# Patient Record
Sex: Male | Born: 1965
Health system: Southern US, Community
[De-identification: ages and names within clinical notes are randomized; demographics above are authoritative.]

## PROBLEM LIST (undated history)

## (undated) DIAGNOSIS — N529 Male erectile dysfunction, unspecified: Secondary | ICD-10-CM

## (undated) DIAGNOSIS — Z8719 Personal history of other diseases of the digestive system: Secondary | ICD-10-CM

## (undated) DIAGNOSIS — D649 Anemia, unspecified: Secondary | ICD-10-CM

## (undated) DIAGNOSIS — F329 Major depressive disorder, single episode, unspecified: Secondary | ICD-10-CM

## (undated) DIAGNOSIS — G4730 Sleep apnea, unspecified: Secondary | ICD-10-CM

## (undated) DIAGNOSIS — I25810 Atherosclerosis of coronary artery bypass graft(s) without angina pectoris: Secondary | ICD-10-CM

## (undated) DIAGNOSIS — R200 Anesthesia of skin: Secondary | ICD-10-CM

## (undated) DIAGNOSIS — K5790 Diverticulosis of intestine, part unspecified, without perforation or abscess without bleeding: Secondary | ICD-10-CM

## (undated) DIAGNOSIS — R202 Paresthesia of skin: Secondary | ICD-10-CM

## (undated) DIAGNOSIS — K5792 Diverticulitis of intestine, part unspecified, without perforation or abscess without bleeding: Secondary | ICD-10-CM

## (undated) DIAGNOSIS — T7840XA Allergy, unspecified, initial encounter: Secondary | ICD-10-CM

## (undated) DIAGNOSIS — I219 Acute myocardial infarction, unspecified: Secondary | ICD-10-CM

## (undated) DIAGNOSIS — Z87440 Personal history of urinary (tract) infections: Secondary | ICD-10-CM

## (undated) DIAGNOSIS — I959 Hypotension, unspecified: Secondary | ICD-10-CM

## (undated) DIAGNOSIS — L309 Dermatitis, unspecified: Secondary | ICD-10-CM

## (undated) DIAGNOSIS — E782 Mixed hyperlipidemia: Secondary | ICD-10-CM

## (undated) DIAGNOSIS — Z86010 Personal history of colonic polyps: Secondary | ICD-10-CM

## (undated) DIAGNOSIS — F32A Depression, unspecified: Secondary | ICD-10-CM

## (undated) HISTORY — DX: Allergy, unspecified, initial encounter: T78.40XA

## (undated) HISTORY — DX: Major depressive disorder, single episode, unspecified: F32.9

## (undated) HISTORY — DX: Personal history of colonic polyps: Z86.010

## (undated) HISTORY — DX: Sleep apnea, unspecified: G47.30

## (undated) HISTORY — DX: Hypotension, unspecified: I95.9

## (undated) HISTORY — DX: Acute myocardial infarction, unspecified: I21.9

## (undated) HISTORY — DX: Atherosclerosis of coronary artery bypass graft(s) without angina pectoris: I25.810

## (undated) HISTORY — DX: Mixed hyperlipidemia: E78.2

## (undated) HISTORY — DX: Depression, unspecified: F32.A

## (undated) HISTORY — DX: Dermatitis, unspecified: L30.9

## (undated) HISTORY — DX: Male erectile dysfunction, unspecified: N52.9

---

## 1988-05-07 DIAGNOSIS — Z87440 Personal history of urinary (tract) infections: Secondary | ICD-10-CM

## 1988-05-07 HISTORY — DX: Personal history of urinary (tract) infections: Z87.440

## 2005-05-07 HISTORY — PX: COLONOSCOPY: SHX174

## 2007-05-08 DIAGNOSIS — I25810 Atherosclerosis of coronary artery bypass graft(s) without angina pectoris: Secondary | ICD-10-CM

## 2007-05-08 HISTORY — PX: CORONARY ARTERY BYPASS GRAFT: SHX141

## 2007-05-08 HISTORY — DX: Atherosclerosis of coronary artery bypass graft(s) without angina pectoris: I25.810

## 2008-01-01 ENCOUNTER — Inpatient Hospital Stay (HOSPITAL_COMMUNITY): Admission: AD | Admit: 2008-01-01 | Discharge: 2008-01-09 | Payer: Self-pay | Admitting: Cardiology

## 2008-01-01 ENCOUNTER — Ambulatory Visit: Payer: Self-pay | Admitting: Cardiology

## 2008-01-01 ENCOUNTER — Ambulatory Visit: Payer: Self-pay | Admitting: Surgery

## 2008-01-02 ENCOUNTER — Encounter: Payer: Self-pay | Admitting: Surgery

## 2008-02-02 ENCOUNTER — Encounter: Payer: Self-pay | Admitting: Physician Assistant

## 2008-02-02 ENCOUNTER — Ambulatory Visit: Payer: Self-pay | Admitting: Cardiology

## 2008-02-11 ENCOUNTER — Encounter: Payer: Self-pay | Admitting: Cardiology

## 2008-02-17 ENCOUNTER — Ambulatory Visit: Payer: Self-pay | Admitting: Surgery

## 2008-02-17 ENCOUNTER — Encounter: Admission: RE | Admit: 2008-02-17 | Discharge: 2008-02-17 | Payer: Self-pay | Admitting: Surgery

## 2008-07-08 ENCOUNTER — Ambulatory Visit: Payer: Self-pay | Admitting: Cardiology

## 2008-07-14 ENCOUNTER — Encounter: Payer: Self-pay | Admitting: Cardiology

## 2008-08-30 ENCOUNTER — Encounter: Payer: Self-pay | Admitting: Cardiology

## 2009-01-18 DIAGNOSIS — I25810 Atherosclerosis of coronary artery bypass graft(s) without angina pectoris: Secondary | ICD-10-CM | POA: Insufficient documentation

## 2009-01-18 DIAGNOSIS — E785 Hyperlipidemia, unspecified: Secondary | ICD-10-CM | POA: Insufficient documentation

## 2009-01-18 DIAGNOSIS — I959 Hypotension, unspecified: Secondary | ICD-10-CM

## 2009-07-05 ENCOUNTER — Ambulatory Visit: Payer: Self-pay | Admitting: Cardiology

## 2009-07-05 DIAGNOSIS — I10 Essential (primary) hypertension: Secondary | ICD-10-CM | POA: Insufficient documentation

## 2009-07-06 ENCOUNTER — Encounter: Payer: Self-pay | Admitting: Cardiology

## 2010-05-28 ENCOUNTER — Encounter: Payer: Self-pay | Admitting: Surgery

## 2010-06-06 NOTE — Assessment & Plan Note (Signed)
Summary: 1 YR FU PER MARCH REMINDER-SRS   Visit Type:  Follow-up Primary Provider:  Hasanaj  CC:  follow-up visit.  History of Present Illness: the patient is a 45 year old male with a history coronary artery disease status post 4-vessel coronary bypass grafting in August of 2009.  The patient has a free radial graft also.  The patient has mild LV dysfunction with an ejection fraction of 45 to 50%.  The patient presents for follow-up.  The patient denies any chest pain shortness of breath palpitations or syncope.  He does report some back pain when bending forward.  From a cardiovascular standpoint he appears to be stable.  He denies any palpitations, syncope orthopnea PND.  Clinical Review Panels:  CXR CXR results  Right internal jugular sheath has been with drawn without   pneumothorax.  Lungs are currently clear.  Heart size is normal.   Post CABG change noted with no other new significant abnormalities   seen.    IMPRESSION:   1.  Post CABG.   2.  No active disease. (02/17/2008)  Cardiac Imaging Cardiac Cath Findings  CONCLUSIONS:   1. Total occlusion of the circumflex and right coronary arteries.   2. Minimum 8 successive lesions in the left anterior descending       artery, and including high-grade disease of the large second       diagonal branch as noted above.   3. Progressive symptoms compatible with angina pectoris.      RECOMMENDATIONS:  The LAD is not ideal for grafting.  Nonetheless, the   apical vessel possibly could be grafted as well as the large diagonal.   In addition, the marginal and the RCA also appear to be optimal for   grafting.  Surgical consultation will be obtained.      Arturo Morton. Riley Kill, MD, [facility] (01/01/2008)    Preventive Screening-Counseling & Management  Alcohol-Tobacco     Smoking Status: never  Current Medications (verified): 1)  Metoprolol Tartrate 25 Mg Tabs (Metoprolol Tartrate) .... Take One Tablet By Mouth Twice A Day 2)   Crestor 40 Mg Tabs (Rosuvastatin Calcium) .... Take One Tablet By Mouth Daily. 3)  Multivitamins  Tabs (Multiple Vitamin) .... Take 1 Tablet By Mouth Once A Day 4)  Budeprion Xl 150 Mg Xr24h-Tab (Bupropion Hcl) .... Take 1 Tablet By Mouth Daily 5)  Aspir-Low 81 Mg Tbec (Aspirin) .... Take 1 Tablet By Mouth Once A Day  Allergies (verified): 1)  ! Pcn 2)  ! Sulfa  Comments:  Nurse/Medical Assistant: The patient's medications and allergies were reviewed with the patient and were updated in the Medication and Allergy Lists. Verbally gave names.  Past History:  Past Medical History: Last updated: 01/18/2009 HYPOTENSION, UNSPECIFIED (ICD-458.9) CAD, ARTERY BYPASS GRAFT (ICD-414.04) HYPERLIPIDEMIA-MIXED (ICD-272.4) Left upper extremity dermatiti  Past Surgical History: Last updated: 01/18/2009 CABG  Family History: Last updated: 01/18/2009 Family History of Coronary Artery Disease:  Family History of Renal Disease:  CHF valvular disease  Social History: Last updated: 01/18/2009 Full Time Married  Tobacco Use - No.  Alcohol Use - yes  Risk Factors: Smoking Status: never (07/05/2009)  Vital Signs:  Patient profile:   45 year old male Height:      74 inches Weight:      208 pounds BMI:     26.80 Pulse rate:   61 / minute BP sitting:   109 / 72  (right arm) Cuff size:   large  Vitals Entered By: Carlye Grippe (July 05, 2009 9:50 AM)  Nutrition Counseling: Patient's BMI is greater than 25 and therefore counseled on weight management options. CC: follow-up visit   Physical Exam  Additional Exam:  General: Well-developed, well-nourished in no distress head: Normocephalic and atraumatic eyes PERRLA/EOMI intact, conjunctiva and lids normal nose: No deformity or lesions mouth normal dentition, normal posterior pharynx neck: Supple, no JVD.  No masses, thyromegaly or abnormal cervical nodes lungs: Normal breath sounds bilaterally without wheezing.  Normal  percussion heart: regular rate and rhythm with normal S1 and S2, no S3 or S4.  PMI is normal.  No pathological murmurs abdomen: Normal bowel sounds, abdomen is soft and nontender without masses, organomegaly or hernias noted.  No hepatosplenomegaly musculoskeletal: Back normal, normal gait muscle strength and tone normal pulsus: Pulse is normal in all 4 extremities Extremities: No peripheral pitting edema neurologic: Alert and oriented x 3 skin: Intact without lesions or rashes cervical nodes: No significant adenopathy psychologic: Normal affect    EKG  Procedure date:  07/05/2009  Findings:      sinus bradycardia.  Otherwise normal EKG.  Heart rate 55 beats/min.  Impression & Recommendations:  Problem # 1:  CAD, ARTERY BYPASS GRAFT (ICD-414.04) the patient is status post coronary bypass grafting.  He denies any substernal chest pain.  He does need a lipid panel checked.  He has no heart failure symptoms. His updated medication list for this problem includes:    Metoprolol Tartrate 25 Mg Tabs (Metoprolol tartrate) .[name]... Take one tablet by mouth twice a day    Aspir-low 81 Mg Tbec (Aspirin) .[name]... Take 1 tablet by mouth once a day  Orders: EKG w/ Interpretation (93000) T-Comprehensive Metabolic Panel (11914-78295) T-CBC No Diff (62130-86578) T-Lipid Profile (46962-95284) T-TSH (13244-01027)  Problem # 2:  HYPERLIPIDEMIA-MIXED (ICD-272.4) Assessment: Comment Only  His updated medication list for this problem includes:    Crestor 40 Mg Tabs (Rosuvastatin calcium) .[name]... Take one tablet by mouth daily.  Orders: T-Comprehensive Metabolic Panel [phone]) T-CBC No Diff (74259-56387) T-Lipid Profile (56433-29518) T-TSH (84166-06301)  Problem # 3:  ESSENTIAL HYPERTENSION, BENIGN (ICD-401.1) blood pressure is well-controlled.  Make no further changes in patient's medications. His updated medication list for this problem includes:    Metoprolol Tartrate 25 Mg Tabs (Metoprolol  tartrate) .[name]... Take one tablet by mouth twice a day    Aspir-low 81 Mg Tbec (Aspirin) .[name]... Take 1 tablet by mouth once a day  Orders: T-Comprehensive Metabolic Panel (60109-32355) T-CBC No Diff (73220-25427) T-Lipid Profile (06237-62831) T-TSH (51761-60737)  Patient Instructions: 1)  Labs  2)  Follow up in  1 year.

## 2010-07-11 ENCOUNTER — Ambulatory Visit (INDEPENDENT_AMBULATORY_CARE_PROVIDER_SITE_OTHER): Payer: PRIVATE HEALTH INSURANCE | Admitting: Cardiology

## 2010-07-11 ENCOUNTER — Encounter: Payer: Self-pay | Admitting: Cardiology

## 2010-07-11 DIAGNOSIS — I2510 Atherosclerotic heart disease of native coronary artery without angina pectoris: Secondary | ICD-10-CM

## 2010-07-11 DIAGNOSIS — G4700 Insomnia, unspecified: Secondary | ICD-10-CM

## 2010-07-11 DIAGNOSIS — Z951 Presence of aortocoronary bypass graft: Secondary | ICD-10-CM

## 2010-07-11 DIAGNOSIS — F411 Generalized anxiety disorder: Secondary | ICD-10-CM | POA: Insufficient documentation

## 2010-07-25 NOTE — Assessment & Plan Note (Signed)
Summary: 1 yr fu recv reminder vs   Visit Type:  Follow-up Primary Provider:  Hasanaj   History of Present Illness: The patient is a 45 year old male with a history of coronary artery disease status post 4-vessel coronary bypass grafting in August of 2009.  The patient had a free radial graft placed.  He has mild LV dysfunction with an ejection fraction of 45 to 50%. Laboratory work was performed approximately year ago.  Renal function was within normal limits.  Liver function tests were within normal limits.  Triglycerides were 197 cholesterol was 137 HDL 28 and LDL 70.  CBC was within normal limits.  The patient had blood work done more recently which we do not available.  He apparently was told that his triglycerides were high and has been started on FishOil The patient has been doing well after coronary bypass grafting.  He reports no recurrent chest pain or shortness of breath orthopnea PND.  He has no palpitations or syncope. The patient's wife is concerned about the use of Wellbutrin because of the fact that the patient has difficulty with sleeping and has significant anxiety.  She requested change to an SSRI but sedated properties.  I told her that did not disagree with her and sertraline may be a good option. 12-lead electrocardiogram was reviewed.  This was within normal limits.  No acute ischemic changes.  Heart rate 79 beats/min.   Preventive Screening-Counseling & Management  Alcohol-Tobacco     Smoking Status: quit     Year Quit: 2005  Current Medications (verified): 1)  Crestor 40 Mg Tabs (Rosuvastatin Calcium) .... Take One Tablet By Mouth Daily. 2)  Multivitamins  Tabs (Multiple Vitamin) .... Take 1 Tablet By Mouth Once A Day 3)  Budeprion Xl 150 Mg Xr24h-Tab (Bupropion Hcl) .... Take 1 Tablet By Mouth Daily 4)  Aspir-Low 81 Mg Tbec (Aspirin) .... Take 1 Tablet By Mouth Once A Day 5)  Bystolic 5 Mg Tabs (Nebivolol Hcl) .... Take 1 Tablet By Mouth Once A Day 6)  Fish Oil   Oil (Fish Oil) .... 360mg  Take 1 Tablet By Mouth Once A Day  Allergies (verified): 1)  ! Pcn 2)  ! Sulfa  Comments:  Nurse/Medical Assistant: The patient's medication bottle and allergies were verbally reviewed with the patient and were updated in the Medication and Allergy Lists.  Past History:  Past Medical History: Last updated: 01/18/2009 HYPOTENSION, UNSPECIFIED (ICD-458.9) CAD, ARTERY BYPASS GRAFT (ICD-414.04) HYPERLIPIDEMIA-MIXED (ICD-272.4) Left upper extremity dermatiti  Past Surgical History: Last updated: 01/18/2009 CABG  Family History: Last updated: 01/18/2009 Family History of Coronary Artery Disease:  Family History of Renal Disease:  CHF valvular disease  Social History: Last updated: 01/18/2009 Full Time Married  Tobacco Use - No.  Alcohol Use - yes  Risk Factors: Smoking Status: quit (07/11/2010)  Social History: Smoking Status:  quit  Review of Systems  The patient denies fatigue, malaise, fever, weight gain/loss, vision loss, decreased hearing, hoarseness, chest pain, palpitations, shortness of breath, prolonged cough, wheezing, sleep apnea, coughing up blood, abdominal pain, blood in stool, nausea, vomiting, diarrhea, heartburn, incontinence, blood in urine, muscle weakness, joint pain, leg swelling, rash, skin lesions, headache, fainting, dizziness, depression, anxiety, enlarged lymph nodes, easy bruising or bleeding, and environmental allergies.    Vital Signs:  Patient profile:   45 year old male Height:      74 inches Weight:      215 pounds BMI:     27.70 Pulse rate:   82 /  minute BP sitting:   109 / 73  (left arm) Cuff size:   large  Vitals Entered By: Carlye Grippe (July 11, 2010 9:54 AM)  Nutrition Counseling: Patient's BMI is greater than 25 and therefore counseled on weight management options.  Physical Exam  Additional Exam:  General: Well-developed, well-nourished in no distress head: Normocephalic and  atraumatic eyes PERRLA/EOMI intact, conjunctiva and lids normal nose: No deformity or lesions mouth normal dentition, normal posterior pharynx neck: Supple, no JVD.  No masses, thyromegaly or abnormal cervical nodes lungs: Normal breath sounds bilaterally without wheezing.  Normal percussion heart: regular rate and rhythm with normal S1 and S2, no S3 or S4.  PMI is normal.  No pathological murmurs abdomen: Normal bowel sounds, abdomen is soft and nontender without masses, organomegaly or hernias noted.  No hepatosplenomegaly musculoskeletal: Back normal, normal gait muscle strength and tone normal pulsus: Pulse is normal in all 4 extremities Extremities: No peripheral pitting edema neurologic: Alert and oriented x 3 skin: Intact without lesions or rashes cervical nodes: No significant adenopathy psychologic: Normal affect  Normal physical examination with well-healed sternotomy scar and a long scar in the left forearm from the free radial graft    Impression & Recommendations:  Problem # 1:  CAD, ARTERY BYPASS GRAFT (ICD-414.04) coronary artery disease: Status post coronary bypass grafting with a free radial graft.  Mild LV dysfunction ejection fraction 45 to 50%.  A portable echocardiogram was performed in the office.  Ejection fraction is approximately 50%.  There is some posterior wall hypokinesis.  I recommended a stress test in one year. The following medications were removed from the medication list:    Metoprolol Tartrate 25 Mg Tabs (Metoprolol tartrate) .[name]... Take one tablet by mouth twice a day His updated medication list for this problem includes:    Aspir-low 81 Mg Tbec (Aspirin) .[name]... Take 1 tablet by mouth once a day    Bystolic 5 Mg Tabs (Nebivolol hcl) .[name]... Take 1 tablet by mouth once a day  Orders: EKG w/ Interpretation (93000)  Problem # 2:  HYPERLIPIDEMIA-MIXED (ICD-272.4) dyslipidemia: Recent lipid profile was done.  I do not have results available but apparently  the patient's primary care physician recommended the addition of fish oil likely for hypertriglyceridemia. His updated medication list for this problem includes:    Crestor 40 Mg Tabs (Rosuvastatin calcium) .[name]... Take one tablet by mouth daily.  Problem # 3:  ANXIETY STATE, UNSPECIFIED (ICD-300.00) Anxiety and insomnia.  Wellbutrin is a poor choice given its arousal properties.  I changed the patient to Zoloft 50 mg p.o. nightly.  I also gave him  a prescription of clonazepam .5 mg p.o. b.i.d. p.r.n.  Patient Instructions: 1)  Your physician has recommended you make the following change in your medication: STOP BUDEPRION AND START ZOLOFT(SERTRALINE) DAILY AT BEDTIME. START CLONAZEPAM AS NEEDED.  2)  Your physician wants you to follow-up in: 6 months.  You will receive a reminder letter in the mail about two months in advance. If you don't receive a letter, please call our office to schedule the follow-up appointment. Prescriptions: CLONAZEPAM 0.5 MG TABS (CLONAZEPAM) Take 1 tablet by mouth two times a day as needed anxiety,insomnia  #60 x 1   Entered by:   Carlye Grippe   Authorized by:   Lewayne Bunting, MD, [facility]   Signed by:   Carlye Grippe on 07/11/2010   Method used:   Print then Give to Patient   RxID:   [id number] ZOLOFT  50 MG TABS (SERTRALINE HCL) Take 1 tablet by mouth once a day at bedtime  #30 x 6   Entered by:   Carlye Grippe   Authorized by:   Lewayne Bunting, MD, [facility]   Signed by:   Carlye Grippe on 07/11/2010   Method used:   Print then Give to Patient   RxID:   [id number]

## 2010-09-19 NOTE — Letter (Signed)
January 09, 2008    Dr. Annette Stable Hasanaj  [address]  [place], [place] [address]   RE:  [name], [name]  MRN:  [id number]  /  DOB:  07-25-1965   Dear Dr. Olena Leatherwood:   Your nice patient was transferred as you know after exercise testing,  urgently to [facility].  He was seen by Dr. Andee Lineman.  He was  transferred promptly to [facility] where he underwent  catheterization which demonstrated total occlusion of the circumflex,  total occlusion of the right coronary artery with multiple severe  lesions of the LAD.  His ejection fraction was 45-50% with inferior  hypokinesis.  He was seen urgently in consultation by Dr. Evelene Croon.  The patient subsequently underwent coronary artery bypass graft surgery  with median sternotomy with a left internal mammary to the LAD, left  radial artery to the obtuse marginal, saphenous vein graft to the  diagonal and a saphenous vein graft to the right coronary artery.  In  general, he has had relatively underwent for hospital course.  He has  slowly and gradually improved.  The surgeons have deemed him ready for  discharge today, January 09, 2008, and he will be dismissed.  He will  be an excellent candidate for rehab in [place], and we will  make arrangements for his followup in the [facility] and  then subsequently with a rehab program.  I have encouraged him to follow  up with you.   Thanks for referring him.    Sincerely,      Arturo Morton. Riley Kill, MD, [facility]  Electronically Signed    TDS/MedQ  DD: 01/09/2008  DT: 01/09/2008  Job #: [id number]   CC:    Gene Serpe, PA-C

## 2010-09-19 NOTE — Consult Note (Signed)
[name], [name]:  [id number]   MEDICAL RECORD NO.:  [id number]          PATIENT TYPE:  INP   LOCATION:  2901                         FACILITY:  [facility]   PHYSICIAN:  Evelene Croon, M.D.     DATE OF BIRTH:  Aug 03, 1965   DATE OF CONSULTATION:  DATE OF DISCHARGE:                                 CONSULTATION   REFERRING PHYSICIAN:  Arturo Morton. Riley Kill, MD, [facility]   REASON FOR CONSULTATION:  Severe three-vessel coronary disease with mild  left ventricular dysfunction.   CLINICAL HISTORY:  I was asked by Dr. Riley Kill to evaluate Mr. Kotch  for consideration of coronary artery bypass graft surgery.  He is a 45-  year-old gentleman with a strong family history of heart disease who  presented with a 2-week history of left shoulder pain while walking as  well as indigestion and substernal chest discomfort after eating.  He  developed similar symptoms this morning and drank a Coke and belched and  his pain went away.  His wife insisted that he be checked and he had a  stress test performed this morning associated with chest pain and  nausea.  He had positive EKG changes.  He is transferred to [facility]  for cardiac catheterization and that showed severe three-vessel coronary  disease.  The LAD was a large vessel that wrapped around the apex.  This  had severe diffuse disease with multiple 70-90% stenoses throughout the  vessel.  This vessel was severely diseased throughout the mid and distal  portion.  There was a moderate to large diagonal branch had 80%  sequential proximal stenosis.  The left circumflex was occluded  proximally with filling of the large marginal branch by collaterals.  The right coronary was also occluded proximally with filling the distal  vessel by collaterals.  Ejection fraction of 45-50% with inferior  hypokinesis.  The left internal mammary artery was patent.  There was no  gradient across the aortic valve and no mitral regurgitation.   PAST  MEDICAL HISTORY:  Significant only for an episode of diverticulitis  requiring hospitalization few years ago.  He denies hypertension,  hyperlipidemia, and diabetes.   REVIEW OF SYSTEMS:  GENERAL:  He denies any fever or chills.  He had no  recent weight changes.  He has had some fatigue.  EYES:  Negative.  ENT:  Negative.  ENDOCRINE:  He denies diabetes and hypothyroidism.  CARDIOVASCULAR:  As above.  He denies PND or orthopnea.  Denies  peripheral edema.  Had no palpitations.  RESPIRATORY:  Denies cough and  sputum production.  GI:  Has had no nausea, vomiting.  Denies melena and  bright red blood per rectum.  GU:  Denies dysuria and hematuria.  MUSCULOSKELETAL:  Denies arthralgias and myalgias.  HEMATOLOGICAL:  He  denies any history of bleeding disorders or easy bleeding.  NEUROLOGIC:  Denies any focal weakness or numbness.  He denies dizziness  and syncope.  He has never had TIA or stroke.   ALLERGIES:  SULFA and PENICILLIN.   MEDICATIONS:  Prior to admission were:  1. Wellbutrin  150 mg daily.  2. Prevacid 30 mg daily.  3. He was given Plavix 300 mg at 10 o'clock this morning.   SOCIAL HISTORY:  He is married and lives with his wife.  He has 2  children.  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  He is a nonsmoker and  drinks occasional alcohol.   FAMILY HISTORY:  Strongly positive for coronary artery disease.  His  father died at age 37 of myocardial infarction.  I had operated on his  mother previously for coronary bypass surgery and she subsequently  expired of severe valvular disease and congestive heart failure with  acute renal failure.  She was 79.   PHYSICAL EXAMINATION:  VITAL SIGNS:  Blood pressure is 112/69.  His  pulse is 90 and regular.  Respiratory rate is 16 unlabored.  He is a  well-developed white male in no distress.  HEENT:  Normocephalic and atraumatic.  Pupils are equal, reactive to  light accommodation.  Extraocular muscles are intact.  Throat is clear.   NECK:  Normal carotid pulses bilaterally.  There are no bruits.  There  is no adenopathy or thyromegaly.  CARDIAC:  Regular rate and rhythm with normal S1 and S2.  There is no  murmur, rub, or gallop.  LUNGS:  Clear.  ABDOMEN: Active bowel sounds.  Abdomen is soft and nontender.  No  palpable masses or organomegaly.  EXTREMITY:  No peripheral edema.  Pedal pulses palpable bilaterally.  SKIN:  Warm and dry.  His radial pulses are strongly palpable  bilaterally.  He is left-handed.  NEUROLOGIC:  Alert and oriented x3.  Motor and sensory exams grossly  normal.   IMPRESSION:  Mr. Goates has severe three-vessel coronary disease with  markedly abnormal stress test and probable recent out-of-hospital  inferior myocardial infarction.  I agree that coronary bypass graft  surgery is the best treatment and further ischemia infarction improve  his quality of life.  We will obtain preoperative carotid and upper  extremity arterial Dopplers tomorrow.  I will tentatively plan to do  coronary bypass surgery on Monday, January 05, 2008.  I discussed the  operative procedure of coronary bypass surgery with him and his wife  including possible use of both internal mammary arteries and a radial  artery graft.  I discussed the use of some saphenous vein.  I discussed  the benefits and risks of surgery including but not limited to bleeding,  blood transfusion, infection, stroke, myocardial infarction, graft  failure, and death.  He understands and agrees to proceed.      Evelene Croon, M.D.  Electronically Signed     BB/MEDQ  D:  01/01/2008  T:  01/02/2008  Job:  [id number]   cc:   [facility]

## 2010-09-19 NOTE — Cardiovascular Report (Signed)
NAMEMCCARTNEY, CHUBA NO.:  [id number]   MEDICAL RECORD NO.:  [id number]          PATIENT TYPE:  INP   LOCATION:  2901                         FACILITY:  [facility]   PHYSICIAN:  Arturo Morton. Riley Kill, MD, FACCDATE OF BIRTH:  August 11, 1965   DATE OF PROCEDURE:  01/01/2008  DATE OF DISCHARGE:                            CARDIAC CATHETERIZATION   INDICATIONS:  Mr. Harker is a 45 year old who had another exercise  tolerance test today.  He developed electrocardiographic abnormalities.  He has had recent discomfort with activity.  He has a strong family  history of coronary artery disease.  This study was done to assess  coronary anatomy.   PROCEDURES:  1. Left heart catheterization.  2. Selective coronary angiography.  3. Selective left ventriculography.  4. Subclavian angiography.   DESCRIPTION OF PROCEDURE:  The patient was brought to the Cath Lab,  prepped and draped in the usual fashion.  Through an anterior puncture,  the femoral artery was easily entered and a 5-French sheath was placed.  Views of left and right coronaries were obtained in several angiographic  projections.  Subclavian angiography was performed.  Central aortic and  left ventricular pressures were measured with a pigtail.  Ventriculography was performed in the RAO projection.  Because of mild  hypotension, his intravenous nitroglycerin was discontinued and  intravenous fluids administered.  There were no complications.  He was  taken to the holding area in satisfactory clinical condition for sheath  removal.  I spoke with his family in detail.   HEMODYNAMIC DATA:  1. Central aortic pressure 184/64, mean 75.  2. Left ventricular pressure 85/5.  3. No gradient or pullback across the aortic valve.   ANGIOGRAPHIC DATA:  1. The left main is free of critical disease.  2. The LAD courses to the apex.  The LAD has multiple lesions      including a 70% proximal mid and 80% lesion at the takeoff of the    third diagonal.  There were then in the distal vessel multiple      lesions including a 90, 70, 50, 90, and 90 in sequence.  There is a      70% apical stenosis as well.  The first diagonal branch is small-to-      moderate in size with 90% ostial narrowing.  Second diagonal is      fairly large in size with 80% proximal and 80% mid narrowing.      Second diagonal does appear to be graftable.  The third diagonal      has 90% ostial narrowing.  3. The circumflex has a tiny second marginal with 90% narrowing that      was totally occluded and it started filling late by collaterals.  4. The right coronary artery is totally occluded proximally and fills      in by left-to-right collaterals.  5. Ventriculography in the RAO projection reveals estimated ejection      fraction of 45-50%.  There is inferior wall motion abnormality in      the mid inferior wall corresponding the circumflex territory and  anteroapical abnormality, that is mild.   CONCLUSIONS:  1. Total occlusion of the circumflex and right coronary arteries.  2. Minimum 8 successive lesions in the left anterior descending      artery, and including high-grade disease of the large second      diagonal branch as noted above.  3. Progressive symptoms compatible with angina pectoris.   RECOMMENDATIONS:  The LAD is not ideal for grafting.  Nonetheless, the  apical vessel possibly could be grafted as well as the large diagonal.  In addition, the marginal and the RCA also appear to be optimal for  grafting.  Surgical consultation will be obtained.      Arturo Morton. Riley Kill, MD, [facility]  Electronically Signed     TDS/MEDQ  D:  01/01/2008  T:  01/02/2008  Job:  [id number]   cc:   Learta Codding, MD,FACC  Wilson Singer, M.D.  CV Laboratory

## 2010-09-19 NOTE — Discharge Summary (Signed)
[name], [name]:  [id number]   MEDICAL RECORD NO.:  [id number]          PATIENT TYPE:  INP   LOCATION:  2020                         FACILITY:  [facility]   PHYSICIAN:  Evelene Croon, M.D.     DATE OF BIRTH:  1966/03/10   DATE OF ADMISSION:  01/01/2008  DATE OF DISCHARGE:                               DISCHARGE SUMMARY   FINAL DIAGNOSIS:  Severe three-vessel coronary artery disease.   IN-HOSPITAL DIAGNOSES:  1. Volume overload postoperatively.  2. Acute blood loss anemia postoperatively.   SECONDARY DIAGNOSES:  1. Gastroesophageal reflux disease.  2. History of depression.  3. History of diverticulitis.   IN-HOSPITAL OPERATIONS AND PROCEDURES:  1. Cardiac catheterization with left heart catheterization, selective      coronary angiography, selective left ventriculography, and      subclavian angiography.  2. Coronary bypass grafting x4 using left internal mammary artery      graft to left anterior descending coronary artery, left radial      artery graft to obtuse marginal branch of left circumflex coronary      artery, saphenous vein graft to diagonal coronary artery, saphenous      vein graft to right coronary artery.  Endoscopic vein harvesting      from right leg and open left radial artery harvest.   HISTORY AND PHYSICAL AND HOSPITAL COURSE:  The patient is a 45 year old  gentleman with a strong family history of heart disease who presented  with a 2-week history of left shoulder pain while walking as well as  indigestion, substernal chest discomfort after eating.  He had a stress  test performed which was positive for ischemia with EKG changes and  developing chest pain.  The patient underwent cardiac catheterization  showing severe three-vessel coronary artery disease.  His ejection  fraction was noted to 45%-50% with inferior hypokinesis.  Dr. Laneta Simmers was  consulted.  Dr. Laneta Simmers saw and evaluated the patient.  He discussed with  the patient  undergoing coronary artery bypass grafting.  He discussed  the risks and benefits with the patient.  The patient acknowledged  understanding and agreed to proceed.  Surgery was scheduled for January 05, 2008.  Preoperatively, the patient remained stable.  For details of  the patient's past medical history and physical exam, please see  dictated H and P.   The patient was taken to the operating room on January 05, 2008, where he  underwent coronary artery bypass grafting x4 using left internal mammary  artery graft to left anterior descending coronary artery, left radial  artery graft to obtuse marginal branch, saphenous vein graft to diagonal  coronary artery, saphenous vein graft to right coronary artery.  He had  endoscopic vein harvest of the right leg and open left radial artery  harvest.  The patient tolerated this procedure well, and transferred to  the intensive care unit in stable condition.  Postoperatively, the  patient was noted to be hemodynamically stable.  He was extubated on  evening of surgery.  Post extubation, the patient noted to be alert and  oriented x4.  Neuro intact.  The patient's postoperative course was  pretty much unremarkable.  He remained hemodynamically stable.  All  drips were able to be weaned and discontinued.  Lines and chest tubes  were discontinued in a normal fashion.  The patient was up and  ambulating without difficulty.  He was transferred out to 2000 postop  day #2.  During the patient's postoperative course, he was noted to be  in normal sinus rhythm.  This patient's blood pressure was borderline.  Initially postoperatively, he was started on low-dose beta-blocker.  He  was also started on Imdur, but secondary to low blood pressures, it was  discontinued.  Blood pressure was monitored to remain stable.  The  patient remained in normal sinus rhythm.  The patient did have some mild  acute blood loss anemia postoperatively.  No transfusions were  required.  H and H were monitored and remained stable.  Postoperatively, the  patient also had some mild volume overload for which she was started on  diuretics for.  This improved  __________ prior to discharge home.  Postoperatively, the patient was out of bed ambulating well with  assistance.  She was tolerating diet well.  No nausea or vomiting noted.  All incisions were noted to be clean, dry, and intact and healing well.  The patient was able to be weaned off oxygen with O2 sats greater than  90% on room air.  Postop day #3, the patient did have a low-grade  temperature of 101.  White blood cell count was within normal limits  that may be secondary to atelectasis.  Urinalysis and culture were  ordered.  This is apparently pending.  The patient's Foley at this time  was discontinued.  It did have to be reinserted secondary to difficulty  voiding.  This stabilized prior to discharge.   Postop day #3, the patient again did have a low-grade temperature.  He  was in normal sinus rhythm.  Blood pressure, heart rate stable.  O2 sats  greater than 90% on room air.  Labs showed sodium of 136, potassium 3.9,  chloride 107, bicarb of 22, BUN of 9, creatinine 0.97, glucose of 99.  CBC day prior showed a white count of 7.0, hemoglobin of 9.6, hematocrit  28%, platelet count 175.  Incisions all clean, dry, and intact and  healing well.  The patient is tentatively ready for discharge home in  the next 1-2 days pending he remains stable and fever resolves.   FOLLOWUP APPOINTMENTS:  Followup appointment has been arranged with Dr.  Laneta Simmers for February 10, 2008, at 2:15 p.m.  The patient will need to  obtain PMI, chest x-ray 30 minutes prior to this appointment.  The  patient will need to follow up with Dr. Riley Kill in 2 weeks.  He will  need to contact this office to make these arrangements.   ACTIVITY:  The patient instructed no driving, he agrees to do so.  No  lifting over 10 pounds.  He is told  to ambulate 3-4 times per day,  progress as tolerated and to continue his breathing exercises.   DIET:  The patient is educated on diet to be low fat and low salt.   INCISIONAL CARE:  The patient is told to shower and washing his  incisions using soap and water.  He is to contact the office if he  develops any drainage or openings from any of his incision sites.   DISCHARGE MEDICATIONS:  1. Prevacid 30 mg  daily.  2. Wellbutrin XL 150 mg daily.  3. Aspirin 325 mg daily.  4. Crestor 40 mg at night.  5. Lopressor 25 mg b.i.d.  6. Oxycodone 5 mg one to two tablets q.4-6 hours p.r.n.       Theda Belfast, [place]      Evelene Croon, M.D.  Electronically Signed    KMD/MEDQ  D:  01/08/2008  T:  01/09/2008  Job:  [id number]   cc:   Arturo Morton. Riley Kill, MD, [facility]

## 2010-09-19 NOTE — Assessment & Plan Note (Signed)
[facility]                          [facility] [facility] OFFICE NOTE   Belton, Peplinski COLA HIGHFILL                      MRN:          [id number]  DATE:02/02/2008                            DOB:          Dec 03, 1965    PRIMARY CARDIOLOGIST:  Learta Codding, MD,FACC.   REASON FOR VISIT:  Post-hospital followup.   HISTORY OF PRESENT ILLNESS:  Mr. Lachapelle presents to our clinic for the  first time, after undergoing recent multivessel CABG at [facility], by  Dr. Evelene Croon.  He was initially referred to [place] here at [facility] in consultation, following an early positive treadmill test.  This was notable for ST segment elevation in the inferior leads.  We  suspected that the patient had had a recent out-of-hospital inferior  myocardial infarction.  Of note, cardiac markers were, in fact, pending  and arrangements were made for immediate transfer for coronary  angiography.  The patient was found to have severe, three-vessel CAD  with 100% occlusion of the CFX and RCA and multiple, high-grade lesions  in the LAD.  LVEF was mildly depressed (EF 45-50%).   The patient underwent subsequent successful, four-vessel CABG with  grafting of the LIMA - LAD; left radial artery - obtuse marginal; SVG -  diagonal; and SVG - RCA.   Postop course was essentially benign; no dysrhythmias noted.  Of note,  the patient was initially started on Imdur, for protection of the left  radial artery graft.  However, this was subsequently discontinued  secondary to persistent hypotension.   Clinically, Mr. Kristiansen has done extremely well following his recent  surgery.  He is ambulating twice a day, up to 30 minutes.  He has noted  marked improvement in his exercise tolerance, since undergoing surgery.  He is scheduled to follow up with Dr. Laneta Simmers next week.  He is  tolerating his medications with some occasional dizziness, but no frank  syncope.   The patient has been referred for  cardiac rehab, but he is awaiting  clearance from a surgical standpoint.   Electrocardiogram today reveals NSR at 82 bpm with normal axis and  persistent, symmetric T-wave inversion in the inferolateral leads.   CURRENT MEDICATIONS:  1. Full dose aspirin.  2. Metoprolol tartrate 25 b.i.d.  3. Crestor 40 daily.  4. Bupropion XL 150 daily.   PHYSICAL EXAMINATION:  VITAL SIGNS:  Blood pressure 105/65, pulse 77,  regular, weight 192.  GENERAL:  A 45 year old male, sitting upright, no distress.  HEENT:  Normocephalic and atraumatic.  NECK:  Palpable bilateral carotid pulses; no JVD at 90 degrees.  LUNGS:  Clear to auscultation in all fields.  HEART:  Regular rate and rhythm.  No significant murmurs.  No rubs.  ABDOMEN:  Soft, nontender.  EXTREMITIES:  No pedal edema.  SKIN:  Well-healing incisions, except for mild peri-incisional erythema  on the left upper extremity.  There is some associated induration and  swelling in the upper hand.  There is no obvious oozing or pus.  NEURO:  No focal deficits.   IMPRESSION:  1. Multivessel CAD.  a.     Status post four-vessel CABG.      b.     Early positive treadmill test.      c.     Question recent out-of-hospital inferior myocardial       infarction.      d.     Mild left ventricular dysfunction (EF 45-50%).  2. Relative hypotension.  3. Mixed dyslipidemia.      a.     Recent profile:  Total cholesterol 195, triglycerides 200,       HDL 23 and LDL 132.  4. Left upper extremity dermatitis.   PLAN:  1. Down titrate aspirin initially to 162 mg daily, then 81 mg      indefinitely.  Otherwise, continue current medication regimen.  I      had considered adding an ACE inhibitor but, given the persistent      relative hypotension, I am not able to do so at present time.  We      will consider this in the future, if blood pressure improves.  2. Surveillance fasting lipids/liver profile in 8 weeks, for      reassessment of lipid status on  Crestor 40 mg daily.  Of note, the      patient was not on a cholesterol lowering agent, prior to this      recent hospitalization.  3. Recommend initiation of antibiotic therapy for treatment of left      arm incision dermatitis.  Given the patient's allergies to both      penicillin and sulfa, I have recommended clindamycin at 300 mg p.o.      q. 8h. (x10 days).  The patient will then have this reexamined by      Dr. Laneta Simmers,      at time of his scheduled follow up with him next week.  4. Schedule return clinic followup with myself and Dr. Andee Lineman in 3      months.      Gene Serpe, PA-C  Electronically Signed      Learta Codding, MD,FACC  Electronically Signed   GS/MedQ  DD: 02/02/2008  DT: 02/03/2008  Job #: [id number]   cc:   Dillard Cannon, M.D.

## 2010-09-19 NOTE — Assessment & Plan Note (Signed)
OFFICE VISIT   [name], [name]  DOB:  Jan 04, 1966                                        February 17, 2008  CHART #:  [id number]   The patient is status post coronary artery bypass grafting x4 done by  Dr. Laneta Simmers on January 05, 2008.  The patient's postoperative course was  pretty much unremarkable.  He was discharged to home in stable  condition.  The patient presents today for his 3-week followup visit.  Since discharge, the patient states he was seen and evaluated by the  cardiologist about 2 weeks ago.  At this appointment, cardiologist  noticed that his left forearm radial artery harvest site had developed a  superficial wound infection and placed the patient on antibiotics for a  total of 10 days.  The patient states that his wound infection cleared  up and he has not had any other problems with it.  He states all other  incisions are healing well.  He is up ambulating 3 or 4 times per day.  He denies any shortness of breath.  He states he still has some mild  incisional pain, which she only takes Tylenol for.  His appetite is  progressing well.  I planned to start cardiac rehabilitation maybe in  the next 1-2 weeks and plans to do this in [place].  He denies any nausea  or vomiting and opening or drainage from any of his incision sites.   PHYSICAL EXAMINATION:  VITALS:  Blood pressure of 106/70, pulse 62,  respirations of 18, and O2 sats 98% on room air.  RESPIRATORY:  Clear to auscultation bilaterally.  CARDIAC:  Regular rate and rhythm.  ABDOMEN:  Benign.  Bowel sounds x4.  EXTREMITIES:  No edema noted.  Warm to touch.  INCISIONS:  All incisions are clean, dry, and intact and healing well.  No signs of cellulitis.  One sacral suture piece was removed from the  patient's left forearm site.   STUDIES:  The patient's PA and lateral chest x-ray done today on February 17, 2008, which is stable.  No signs of pleural effusion, atelectasis,  or pneumonia  noted.  Sternum appeared intact.   IMPRESSION AND PLAN:  The patient is progressing quite well.  He was  seen and evaluated by Dr. Laneta Simmers.  Dr. Edwyna Shell evaluated the patient's  chest x-ray.  The patient is to continue ambulating 3-4 times per day.  He is also to continue his breathing exercises.  He is to follow up with  his Cardiology as directed.  Due to the patient requiring heavy lifting  at work, he is told to hold off from work for total of 3 months post  surgery.  Okay to return to work on April 06, 2008.  The patient is  instructed okay to drive starting today.  He is still instructed no  heavy lifting over 10 pounds for another 2 months.  We will which  release the patient from the office.  He is told if he has any surgical  issues, he is to contact us.  The patient is in agreement.   Evelene Croon, M.D.  Electronically Signed   KMD/MEDQ  D:  02/17/2008  T:  02/18/2008  Job:  [id number]   cc:   Arturo Morton. Riley Kill, MD, [facility]

## 2010-09-19 NOTE — Op Note (Signed)
[name], [name]:  [id number]   MEDICAL RECORD NO.:  [id number]          PATIENT TYPE:  INP   LOCATION:  2308                         FACILITY:  [facility]   PHYSICIAN:  Evelene Croon, M.D.     DATE OF BIRTH:  Jun 24, 1965   DATE OF PROCEDURE:  01/05/2008  DATE OF DISCHARGE:                               OPERATIVE REPORT   PREOPERATIVE DIAGNOSIS:  Severe 3-vessel coronary artery disease.   POSTOPERATIVE DIAGNOSIS:  Severe 3-vessel coronary artery disease.   OPERATIVE PROCEDURES:  Median sternotomy, extracorporeal circulation,  coronary artery bypass graft surgery x4 using a left internal mammary  artery graft to the left anterior descending coronary artery, with a  left radial artery graft to the obtuse marginal branch of the left  circumflex coronary artery, saphenous vein graft to the diagonal  coronary artery, and a saphenous vein graft to the right coronary  artery.  Endoscopic vein harvesting from the right leg.   ATTENDING SURGEON:  Evelene Croon, MD   ASSISTANT:  Rowe Clack, P.A.-C, and Theda Belfast, [place]   ANESTHESIA:  General endotracheal.   CLINICAL HISTORY:  This patient is a 45 year old gentleman with a strong  family history of heart disease who presented with a 2-week history of  left shoulder pain while walking as well as indigestion and substernal  chest discomfort after eating.  He had a stress test performed which was  positive for ischemia with electrocardiogram changes and developing the  chest pain.  He underwent cardiac catheterization, which showed severe 3-  vessel disease.  The LAD was a large vessel, wrapped around the apex,  and was diffusely diseased.  It had 70% proximal and 80% mid stenosis as  well as multiple 70%-90% stenoses throughout the mid to distal portion.  The diagonal branch was a moderate-sized vessel and had 80% proximal and  midvessel stenosis.  The left circumflex was occluded in its proximal  portion  with filling of a large marginal branch by bridging collaterals.  The right coronary artery was also occluded proximally with filling of  the distal vessel by collaterals from the left and right.  Left  ventricular ejection fraction was 45%-50% with inferior hypokinesis.  There was no gradient across the aortic valve and no mitral  regurgitation.  After review of the angiogram and examination of the  patient, it was felt that coronary bypass graft surgery was the best  treatment to prevent further ischemia and infarction.  I discussed the  operative procedure with the patient and his wife and family.  We  discussed alternatives, benefits, and risks including, but not limited  to bleeding, blood transfusion, infection, stroke, myocardial  infarction, graft failure, and death.  I also discussed possible use of  a left radial artery graft depending on his Doppler examination and  preoperative arterial Doppler examination showed normal palmar arch flow  with radial compression.  There was decreased palmar arch flow by 50%  with ulnar compression bilaterally.  His family understood all this and  agreed to proceed.   OPERATIVE PROCEDURE:  The patient was taken to  the operating room and  placed on table in supine position.  After induction of general  endotracheal anesthesia, a Foley catheter was placed in a bladder using  sterile technique.  Then, the patient's left arm was placed on armboard  for radial artery harvest.  The neck, chest, left arm, and both lower  extremities were prepped and draped in usual sterile manner.  Then, the  chest was opened through a median sternotomy incision and the  pericardium opened in midline.  Examination of the heart showed good  ventricular contractility.  The ascending aorta had no palpable plaques  in it.   Then, the left internal mammary artery was harvested from the chest wall  as a pedicle graft.  This was a medium caliber vessel with excellent   blood pressure.  At the same time,  a segment of greater saphenous vein  was harvested from the right leg using endoscopic vein harvest  technique.  This vein was a medium sized and good quality.  At the same  time, the left radial artery graft was harvested through a longitudinal  incision over the artery.  This incision was started distally just  proximal to the wrist and the radial artery identified and mobilized as  a pedicle.  It was checked for adequacy of the palmar arch flow by  placing atraumatic vascular clamp across the mammary pedicle and then  checking Doppler flow just beyond the clamp at the wrist level.  A good  Doppler signal remained with the radial artery clamp.  The clamp was  then removed.  The radial artery was then harvested as a pedicle.  The  side branches were divided using a harmonic scalpel.  After all the side  branches were divided, the artery was suture ligated proximally and  distally with 2-0 silk sutures and divided.  It was flushed with heparin-  saline solution and cannulated proximally with a 18-gauge Jelco  catheter.  It was flushed with heparin-saline solution and all the side  branches were clipped.   Then, the patient was heparinized when an adequate activated clotting  time was achieved.  The distal ascending aorta was cannulated using a 20-  French aortic cannula for arterial inflow.  Venous outflow was achieved  using a 2-stage venous cannula through the right atrial appendage.  An  antegrade cardioplegia and vent cannula was inserted in the aortic root.   The patient was placed on cardiopulmonary bypass and the distal  coronaries identified.  The LAD was diffusely diseased.  The only place  that was graftable was distally just before the apex.  The diagonal  branch was a moderate-sized graftable vessel with mild proximal disease  in it.  The obtuse marginal was a large vessel that was occluded  proximally, but had no significant distal  disease.  The right coronary  artery was diffusely diseased with calcific plaque.  This extended out  almost to the takeoff of the posterior descending branch, but this area  just before the posterior descending artery was softer and suitable for  grafting.  The posterior descending and posterolateral branches  themselves were all small and nongraftable vessels.   Then, the aorta was cross-clamped and 500 mL of cold blood antegrade  cardioplegia was administered in the aortic root with quick arrest of  the heart.  Systemic hypothermia to 28 degrees centigrade and topical  hypothermic iced saline was used.  A temperature probe was placed in the  septum and insulating pad in the  pericardium.  Additional doses of  cardioplegia were given into the aortic root and through the grafts at  approximately 20-minute intervals.  In between cardioplegia doses, the  myocardial temperature continued to rise.  There was a fair amount of  collateral flow present when the arteries were opened.   Then, the first distal anastomosis was performed to the distal right  coronary artery just before the takeoff of the posterior descending  branch.  The internal diameter of this vessel was about 1.75 mm.  The  conduit was used was a segment of greater saphenous vein and the  anastomosis formed in end-to-side manner using continuous 7-0 Prolene  suture.  Flow was noted through the graft and was excellent.   The second distal anastomosis was performed to the obtuse marginal  branch.  The internal diameter was about 1.75 mm.  The conduit used was  a left radial artery graft and this was anastomosed in end-to-side  manner using continuous 8-0 Prolene suture.  Flow was noted through the  graft and was excellent.   The third distal anastomosis was performed to the diagonal branch.  The  internal diameter was also about 1.75 mm.  The conduit was used was a  segment of greater saphenous vein and the anastomosis  performed in an  end-to-side manner using continuous 7-0 Prolene suture.  Flow was noted  through the graft and was excellent.   The fourth distal anastomosis was performed to the distal LAD.  The  internal diameter was about 1.6 mm here.  The conduit was used was a  left internal mammary graft was this was brought through an opening in  the left pericardium anterior of the phrenic nerve.  It was anastomosed  of LAD in end-to-side manner using continuous 8-0 Prolene suture.  The  pedicle was sutured to the epicardium with 6-0 Prolene sutures.  The  patient was rewarmed with 37 degrees centigrade.  With crossclamp in  place, the 2 proximal vein graft anastomoses were performed of the  aortic root in end-to-side manner using continuous 6-0 Prolene suture.  Then, the proximal anastomosis of the radial artery graft was performed  to the hood of the diagonal vein graft in end-to-side manner using  continuous 7-0 Prolene suture.  The clamp was then removed from the  mammary pedicle.  There was a rapid warming of the ventricular septum  and return of spontaneous ventricular fibrillation.  Crossclamp was  removed with a time of 81 minutes and the patient was spontaneously  converted to sinus rhythm.  The proximal and distal anastomoses appeared  hemostatic while the graft was satisfactory.  The graft markers placed  around the proximal anastomoses.  Two temporary right ventricular and  right atrial pacing wire was placed and brought through the skin.   When the patient rewarmed to 37 degrees centigrade, he was weaned from  cardiopulmonary bypass on no inotropic agents.  Total bypass time was 96  minutes.  Cardiac function appeared excellent with cardiac output of 5 L  per minute.  Protamine was given, venous and aortic cannula was removed  without difficulty.  Hemostasis was achieved.  Three chest tubes were  placed with tube in the post pericardium, one in the left pleural space,  and one in  the anterior mediastinum.  The pericardium was loosely  reapproximated over the heart.  The sternum was closed #6 stainless  steel wires.  The fascia was closed with continuous #1 Vicryl suture.  Subcutaneous tissue was  closed with continuous 2-0 Vicryl and the skin  with a 3-0 Vicryl subcuticular closure.  The lower extremity vein  harvest site was closed in layers in similar manner.  The left arm  incision had already been closed medially after harvesting the radial  artery.  This was closed in layers using continuous 2-0 Vicryl  subcutaneous suture and 3-0 Vicryl subcuticular skin closure.  The  sponge, needle, and instrument counts were correct according to the  scrub nurse.  Dry sterile dressing was applied over the incision and  around the chest tubes, which were Pleur-Evac suction.  The patient  remained hemodynamically stable and was transported to the SICU in  guarded, but stable condition.      Evelene Croon, M.D.  Electronically Signed     BB/MEDQ  D:  01/05/2008  T:  01/06/2008  Job:  [id number]   cc:   [facility]

## 2010-11-14 ENCOUNTER — Other Ambulatory Visit: Payer: Self-pay | Admitting: *Deleted

## 2010-11-14 MED ORDER — ROSUVASTATIN CALCIUM 40 MG PO TABS: 40.0000 mg | ORAL_TABLET | Freq: Every day | ORAL | Status: DC

## 2011-01-16 ENCOUNTER — Other Ambulatory Visit: Payer: Self-pay | Admitting: *Deleted

## 2011-01-16 MED ORDER — SERTRALINE HCL 50 MG PO TABS: 50.0000 mg | ORAL_TABLET | Freq: Every day | ORAL | Status: DC

## 2011-01-19 ENCOUNTER — Encounter: Payer: Self-pay | Admitting: Cardiology

## 2011-01-23 ENCOUNTER — Ambulatory Visit (INDEPENDENT_AMBULATORY_CARE_PROVIDER_SITE_OTHER): Payer: PRIVATE HEALTH INSURANCE | Admitting: Cardiology

## 2011-01-23 ENCOUNTER — Other Ambulatory Visit: Payer: Self-pay | Admitting: Cardiology

## 2011-01-23 ENCOUNTER — Other Ambulatory Visit: Payer: Self-pay | Admitting: *Deleted

## 2011-01-23 ENCOUNTER — Telehealth: Payer: Self-pay | Admitting: *Deleted

## 2011-01-23 ENCOUNTER — Encounter: Payer: Self-pay | Admitting: *Deleted

## 2011-01-23 ENCOUNTER — Encounter: Payer: Self-pay | Admitting: Cardiology

## 2011-01-23 VITALS — BP 112/75 | HR 61 | Ht 75.0 in | Wt 217.0 lb

## 2011-01-23 DIAGNOSIS — F411 Generalized anxiety disorder: Secondary | ICD-10-CM

## 2011-01-23 DIAGNOSIS — Z79899 Other long term (current) drug therapy: Secondary | ICD-10-CM

## 2011-01-23 DIAGNOSIS — I2510 Atherosclerotic heart disease of native coronary artery without angina pectoris: Secondary | ICD-10-CM

## 2011-01-23 DIAGNOSIS — Z951 Presence of aortocoronary bypass graft: Secondary | ICD-10-CM

## 2011-01-23 DIAGNOSIS — G4733 Obstructive sleep apnea (adult) (pediatric): Secondary | ICD-10-CM

## 2011-01-23 DIAGNOSIS — R0602 Shortness of breath: Secondary | ICD-10-CM

## 2011-01-23 DIAGNOSIS — E785 Hyperlipidemia, unspecified: Secondary | ICD-10-CM

## 2011-01-23 DIAGNOSIS — I25810 Atherosclerosis of coronary artery bypass graft(s) without angina pectoris: Secondary | ICD-10-CM

## 2011-01-23 MED ORDER — SERTRALINE HCL 50 MG PO TABS: 50.0000 mg | ORAL_TABLET | Freq: Every day | ORAL | Status: DC

## 2011-01-23 NOTE — Assessment & Plan Note (Signed)
Patient is due for lipid panel and liver function tests.

## 2011-01-23 NOTE — Patient Instructions (Signed)
   Stress Echo  Your physician recommends that you have the following labs:  FLP & LFT - can do same day as test above.  Reminder:  Nothing to eat or drink after 12 midnight prior to labs. Zoloft - can decrease to 25mg  on Saturday & Sunday for sexual dysfunction Sleep study - will send referral to Dr. Andrey Campanile If the results of your test are normal or stable, you will receive a letter.  If they are abnormal, the nurse will contact you by phone. Your physician wants you to follow up in: 6 months.  You will receive a reminder letter in the mail one-two months in advance.  If you don't receive a letter, please call our office to schedule the follow up appointment

## 2011-01-23 NOTE — Progress Notes (Signed)
HPI The patient presents for followup today. He has a history of coronary disease and is status post bypass grafting in 2009. He is doing well. He reports no chest pain shortness of breath orthopnea PND. Patient's wife is concerned whether he could have obstructive sleep apnea and does appear to have apneic episodes during the nighttime. Her last office visit he had significant anxiety and depression which has significantly improved sertraline. However he does report sexual dysfunction related to the medication. He does report some fatigue but does not appear to use excessively his clonazepam. His wife though does feel that he is significantly improved on sertraline. The patient has not had any ischemia workup in the last several years. I did perform a bedside echocardiogram today which showed an area of scar/infarct in the inferior and posterior wall. Overall ejection fraction appeared to be around 50%.  Allergies  Allergen Reactions  . Penicillins   . Sulfonamide Derivatives     REACTION: rash,SOB    Current Outpatient Prescriptions on File Prior to Visit  Medication Sig Dispense Refill  . aspirin 81 MG tablet Take 81 mg by mouth daily.        . clonazePAM (KLONOPIN) 0.5 MG tablet Take 0.5 mg by mouth 2 (two) times daily as needed.        . fish oil-omega-3 fatty acids 1000 MG capsule Take 360 mg by mouth daily.        . Multiple Vitamin (MULTIVITAMIN) tablet Take 1 tablet by mouth daily.        . nebivolol (BYSTOLIC) 5 MG tablet Take 5 mg by mouth daily.        . rosuvastatin (CRESTOR) 40 MG tablet Take 1 tablet (40 mg total) by mouth daily.  90 tablet  0    Past Medical History  Diagnosis Date  . Hypotension, unspecified   . CAD (coronary artery disease) of artery bypass graft   . Hyperlipidemia, mixed   . Dermatitis     Left upper extremity    Past Surgical History  Procedure Date  . Coronary artery bypass graft     Family History  Problem Relation Age of Onset  . Kidney  disease Other   . Coronary artery disease Other   . Heart failure Other     History   Social History  . Marital Status: Married    Spouse Name: N/A    Number of Children: N/A  . Years of Education: N/A   Occupational History  . Full time [employer]   Social History Main Topics  . Smoking status: Former Smoker    Quit date: 05/07/2004  . Smokeless tobacco: Not on file   Comment: chewed tobacco x 20 yrs - quit 2000  . Alcohol Use: Yes  . Drug Use: Not on file  . Sexually Active: Not on file   Other Topics Concern  . Not on file   Social History Narrative  . No narrative on file  [id number] positives as outlined above. The remainder of the 18  point review of systems is negative  PHYSICAL EXAM BP 112/75  Pulse 61  Ht 6\' 3"  (1.905 m)  Wt 217 lb (98.431 kg)  BMI 27.12 kg/m2  General: Well-developed, well-nourished in no distress Head: Normocephalic and atraumatic Eyes:PERRLA/EOMI intact, conjunctiva and lids normal Ears: No deformity or lesions Mouth:normal dentition, normal posterior pharynx Neck: Supple, no JVD.  No masses, thyromegaly or abnormal cervical nodes Lungs: Normal breath sounds bilaterally without wheezing.  Normal  percussion Chest: Well-healed sternotomy scar Cardiac: regular rate and rhythm with normal S1 and S2, no S3 or S4.  PMI is normal.  No pathological murmurs Abdomen: Normal bowel sounds, abdomen is soft and nontender without masses, organomegaly or hernias noted.  No hepatosplenomegaly MSK: Back normal, normal gait muscle strength and tone normal Vascular: Pulse is normal in all 4 extremities Extremities: No peripheral pitting edema Neurologic: Alert and oriented x 3 Skin: Intact without lesions or rashes Lymphatics: No significant adenopathy Psychologic: Normal affect   ECG: Sinus bradycardia. Septal infarct pattern otherwise no acute changes.  ASSESSMENT AND PLAN

## 2011-01-23 NOTE — Assessment & Plan Note (Signed)
The patient will be screened for sleep apnea. He does have some right ventricular dilatation and clinically he reports episodes of apnea.

## 2011-01-23 NOTE — Telephone Encounter (Signed)
No precert required 

## 2011-01-23 NOTE — Assessment & Plan Note (Signed)
The patient has not had any stress testing of the last 2 years. He has good echocardiographic windows by bedside echocardiogram and we will proceed with a stress echocardiogram. His beta blocker will need to be held 24 hours prior to stress testing.

## 2011-01-23 NOTE — Telephone Encounter (Signed)
Stress Echo w/o contrast set for October 3 @ [facility]

## 2011-01-23 NOTE — Assessment & Plan Note (Signed)
Significantly improved on sertraline. This is associated however with significant sexual dysfunction even a low dose. I suggested to the patient to take a "honeymoon" ofreduced dose sertraline the weekend.

## 2011-02-01 ENCOUNTER — Other Ambulatory Visit: Payer: Self-pay | Admitting: *Deleted

## 2011-02-01 DIAGNOSIS — G4733 Obstructive sleep apnea (adult) (pediatric): Secondary | ICD-10-CM

## 2011-02-07 DIAGNOSIS — R072 Precordial pain: Secondary | ICD-10-CM

## 2011-02-07 LAB — BASIC METABOLIC PANEL
BUN: 11
BUN: 12
CO2: 22
CO2: 25
Calcium: 8.2 — ABNORMAL LOW
Calcium: 8.5
Chloride: 109
Creatinine, Ser: 0.88
Creatinine, Ser: 0.94
Creatinine, Ser: 0.97
GFR calc Af Amer: >60
GFR calc Af Amer: >60
GFR calc non Af Amer: >60
Glucose, Bld: 110 — ABNORMAL HIGH
Potassium: 3.5
Sodium: 137

## 2011-02-07 LAB — URINALYSIS, ROUTINE W REFLEX MICROSCOPIC
Bilirubin Urine: NEGATIVE
Protein, ur: NEGATIVE
Urobilinogen, UA: 1

## 2011-02-07 LAB — GLUCOSE, CAPILLARY
Glucose-Capillary: 121 — ABNORMAL HIGH
Glucose-Capillary: 127 — ABNORMAL HIGH
Glucose-Capillary: 130 — ABNORMAL HIGH

## 2011-02-07 LAB — CBC
HCT: 28.3 — ABNORMAL LOW
Hemoglobin: 9.6 — ABNORMAL LOW
MCHC: 33.8
MCHC: 34.1
MCV: 85.8
Platelets: 206
Platelets: 206
RBC: 3.35 — ABNORMAL LOW
RDW: 12.4
WBC: 11.9 — ABNORMAL HIGH

## 2011-02-07 LAB — POCT I-STAT, CHEM 8
BUN: 15
Calcium, Ion: 1.25
Creatinine, Ser: 1.1
Glucose, Bld: 131 — ABNORMAL HIGH
TCO2: 25

## 2011-02-07 LAB — URINE CULTURE
Colony Count: NO GROWTH
Culture: NO GROWTH

## 2011-02-07 LAB — CREATININE, SERUM
Creatinine, Ser: 0.87
GFR calc Af Amer: >60

## 2011-02-07 LAB — MAGNESIUM: Magnesium: 2.2

## 2011-02-13 ENCOUNTER — Encounter: Payer: Self-pay | Admitting: Cardiology

## 2011-02-23 ENCOUNTER — Other Ambulatory Visit: Payer: Self-pay | Admitting: Cardiology

## 2011-05-12 ENCOUNTER — Other Ambulatory Visit: Payer: Self-pay | Admitting: Cardiology

## 2011-09-04 ENCOUNTER — Encounter: Payer: Self-pay | Admitting: Cardiology

## 2011-09-04 ENCOUNTER — Ambulatory Visit (INDEPENDENT_AMBULATORY_CARE_PROVIDER_SITE_OTHER): Payer: PRIVATE HEALTH INSURANCE | Admitting: Cardiology

## 2011-09-04 VITALS — BP 97/61 | HR 79 | Ht 75.0 in | Wt 228.0 lb

## 2011-09-04 DIAGNOSIS — Z79899 Other long term (current) drug therapy: Secondary | ICD-10-CM

## 2011-09-04 DIAGNOSIS — E785 Hyperlipidemia, unspecified: Secondary | ICD-10-CM

## 2011-09-04 DIAGNOSIS — E7800 Pure hypercholesterolemia, unspecified: Secondary | ICD-10-CM

## 2011-09-04 DIAGNOSIS — N529 Male erectile dysfunction, unspecified: Secondary | ICD-10-CM

## 2011-09-04 DIAGNOSIS — I959 Hypotension, unspecified: Secondary | ICD-10-CM

## 2011-09-04 DIAGNOSIS — I25810 Atherosclerosis of coronary artery bypass graft(s) without angina pectoris: Secondary | ICD-10-CM | POA: Insufficient documentation

## 2011-09-04 DIAGNOSIS — G4730 Sleep apnea, unspecified: Secondary | ICD-10-CM

## 2011-09-04 DIAGNOSIS — E782 Mixed hyperlipidemia: Secondary | ICD-10-CM

## 2011-09-04 MED ORDER — TADALAFIL 10 MG PO TABS: ORAL_TABLET | ORAL | Status: DC

## 2011-09-04 NOTE — Progress Notes (Signed)
Nicholas Bottoms, MD, [facility] Board Certified in Adult Cardiovascular Medicine,Internal Medicine and Critical Care Medicine    CC:  Followup patient with coronary artery disease  HPI:  The patient is a 46 year old male with history of coronary bypass grafting. He reports no recurrent chest pain shortness of breath orthopnea PND. He is less fatigued now that he is on CPAP. He has been diagnosed with mild to moderate obstructive sleep apnea. He reports no palpitations presyncope or syncope. He does complain of erectile dysfunction which was this was thought to be secondary to sertraline. However it is still on a beta blocker which also could be contributing to this problem. Has occasional dizziness associated with low blood pressure.  PMH: reviewed and listed in Problem List in Electronic Records (and see below) Past Medical History  Diagnosis Date  . Hypotension, unspecified   . CAD (coronary artery disease) of artery bypass graft 2009    Status post bypass grafting 2009 , status post stress echo 2012 no ischemia.  . Hyperlipidemia, mixed   . Dermatitis     Left upper extremity  . Sleep apnea     On 8 cm of water CPAP  . Erectile dysfunction    Past Surgical History  Procedure Date  . Coronary artery bypass graft     Allergies/SH/FHX : available in Electronic Records for review  Allergies  Allergen Reactions  . Penicillins   . Sulfonamide Derivatives     REACTION: rash,SOB   History   Social History  . Marital Status: Married    Spouse Name: N/A    Number of Children: N/A  . Years of Education: N/A   Occupational History  . Full time [employer]   Social History Main Topics  . Smoking status: Former Smoker -- 0.2 packs/day for 5 years    Types: Cigarettes, Cigars    Quit date: 05/07/2004  . Smokeless tobacco: Former Neurosurgeon    Types: Chew    Quit date: 05/07/1997   Comment: chewed tobacco x 20 yrs - quit 2000  . Alcohol Use: Yes  . Drug Use: Not on file  .  Sexually Active: Not on file   Other Topics Concern  . Not on file   Social History Narrative  . No narrative on file   Family History  Problem Relation Age of Onset  . Kidney disease Other   . Coronary artery disease Other   . Heart failure Other     Medications: Current Outpatient Prescriptions  Medication Sig Dispense Refill  . aspirin 81 MG tablet Take 81 mg by mouth daily.        . fish oil-omega-3 fatty acids 1000 MG capsule Take 1,000 mg by mouth daily.       . Multiple Vitamin (MULTIVITAMIN) tablet Take 1 tablet by mouth daily.        . NON FORMULARY CPAP Use as directed      . rosuvastatin (CRESTOR) 40 MG tablet Take 1 tablet (40 mg total) by mouth daily.  90 tablet  3  . tadalafil (CIALIS) 10 MG tablet Take one tab before intercourse.  10 tablet  1    ROS: No nausea or vomiting. No fever or chills.No melena or hematochezia.No bleeding.No claudication  Physical Exam: BP 97/61  Pulse 79  Ht 6\' 3"  (1.905 m)  Wt 228 lb (103.42 kg)  BMI 28.50 kg/m2 General: well-nourished white male in no distress. Neck: normal carotid upstroke no carotid bruits. No thyromegaly nonnodular  thyroid Lungs: clear breath sounds bilaterally. No wheezing Cardiac: regular rate and rhythm with normal S1-S2 no murmur rubs or gallops Vascular: no edema. Normal distal pulses Skin: Warm and dry Physcologic: normal affect  12lead ECG: not obtained Limited bedside ECHO:N/A No images are attached to the encounter.   Assessment and Plan  HYPOTENSION, UNSPECIFIED Patient continues to have relatively low blood pressure and he seems to be associated with dizziness. We'll discontinue his beta blocker. I've asked the patient to monitor his blood pressure. It is possible that his medications contributing to his erectile dysfunction  HYPERLIPIDEMIA-MIXED Followup lipid panel in one month. Previously LDL 76 mg percent and at goal. Continue therapeutic lifestyle changes  CAD, ARTERY BYPASS  GRAFT Normal stress echo in 2012. The patient reports no recurrent substernal chest pain. No symptoms consistent with ischemia  Sleep apnea Diagnosed with obstructive sleep apnea and now on CPAP. The patient feels better rested.  Erectile dysfunction I given the patient prescription of Cialis 10 mg x20 tablets. Instructions were given to avoid nitroglycerin    Patient Active Problem List  Diagnoses  . HYPERLIPIDEMIA-MIXED  . ESSENTIAL HYPERTENSION, BENIGN  . CAD, ARTERY BYPASS GRAFT  . HYPOTENSION, UNSPECIFIED  . ANXIETY STATE, UNSPECIFIED  . Erectile dysfunction  . Sleep apnea  . Hyperlipidemia, mixed  . CAD (coronary artery disease) of artery bypass graft

## 2011-09-04 NOTE — Assessment & Plan Note (Signed)
Diagnosed with obstructive sleep apnea and now on CPAP. The patient feels better rested.

## 2011-09-04 NOTE — Assessment & Plan Note (Signed)
Followup lipid panel in one month. Previously LDL 76 mg percent and at goal. Continue therapeutic lifestyle changes

## 2011-09-04 NOTE — Assessment & Plan Note (Signed)
Patient continues to have relatively low blood pressure and he seems to be associated with dizziness. We'll discontinue his beta blocker. I've asked the patient to monitor his blood pressure. It is possible that his medications contributing to his erectile dysfunction

## 2011-09-04 NOTE — Patient Instructions (Signed)
   Stop Bystolic (erectile dysfunction may get better after stopping this medication)  Cialis 10mg  - one tab prior to intercourse   E-mail blood pressure readings Your physician recommends that you go to the [facility] for lab work for fasting lipid and liver panel.  Reminder:  Nothing to eat or drink after 12 midnight prior to labs. If the results of your test are normal or stable, you will receive a letter.  If they are abnormal, the nurse will contact you by phone. Your physician wants you to follow up in:  1 year.  You will receive a reminder letter in the mail one-two months in advance.  If you don't receive a letter, please call our office to schedule the follow up appointment

## 2011-09-04 NOTE — Assessment & Plan Note (Signed)
Normal stress echo in 2012. The patient reports no recurrent substernal chest pain. No symptoms consistent with ischemia

## 2011-09-04 NOTE — Assessment & Plan Note (Signed)
I given the patient prescription of Cialis 10 mg x20 tablets. Instructions were given to avoid nitroglycerin

## 2011-09-14 ENCOUNTER — Telehealth: Payer: Self-pay | Admitting: *Deleted

## 2011-09-14 NOTE — Telephone Encounter (Signed)
Mr. Stipes those are  excellent blood pressures.they are in the perfect range not too low and not too high. Continue to hold your Bystolic. I hope also that clinically feel improved. Keep your medical regimen as is for now and let me know if you have any other questions.  Sincerely Alvin Critchley Gent,MD   On Thu, Sep 13, 2011 at 8:38 PM, @[email or web address] wrote:               Day                                  AM                                  PM           Friday                              117/69                                 123/82           Saturday                            122/73                                 119/73           Sunday                              115/72                                 124/78           Monday                              117/71                                 122/76           Tuesday                             109/73                                 126/78           Wednesday                           111/74                                 12 2/81           Thursday  117/75                                 122/70      Last dose of Bystolic was taken Tuesday April 30                Kerrie Pleasure, phone [phone].    Birthdate 10-14-2065     --  Peyton Bottoms, MD, [facility]  ABIM Board Certified in Internal Medicine, Cardiovascular Disease and Critical Care Medicine   The information in this electronic mail is sensitive, protected information  intended only for the addressee(s).  Any other person, including anyone who  believes he/she might have received it due to an addressing error, is requested  to notify the sender immediately and delete it without further reading or  retention.  The information is not to be forwarded or shared unless in  compliance with [employer] on confidentiality and/or with the approval of  the sender.

## 2011-10-03 ENCOUNTER — Encounter: Payer: Self-pay | Admitting: *Deleted

## 2012-03-12 ENCOUNTER — Emergency Department (HOSPITAL_COMMUNITY)
Admission: EM | Admit: 2012-03-12 | Discharge: 2012-03-12 | Disposition: A | Discharge status: Home / Self Care | Payer: PRIVATE HEALTH INSURANCE | Attending: Emergency Medicine | Admitting: Emergency Medicine

## 2012-03-12 ENCOUNTER — Encounter (HOSPITAL_COMMUNITY): Payer: Self-pay | Admitting: Emergency Medicine

## 2012-03-12 DIAGNOSIS — Z872 Personal history of diseases of the skin and subcutaneous tissue: Secondary | ICD-10-CM | POA: Insufficient documentation

## 2012-03-12 DIAGNOSIS — M79606 Pain in leg, unspecified: Secondary | ICD-10-CM

## 2012-03-12 DIAGNOSIS — S99919A Unspecified injury of unspecified ankle, initial encounter: Secondary | ICD-10-CM | POA: Insufficient documentation

## 2012-03-12 DIAGNOSIS — N529 Male erectile dysfunction, unspecified: Secondary | ICD-10-CM | POA: Insufficient documentation

## 2012-03-12 DIAGNOSIS — M25559 Pain in unspecified hip: Secondary | ICD-10-CM | POA: Insufficient documentation

## 2012-03-12 DIAGNOSIS — Y9301 Activity, walking, marching and hiking: Secondary | ICD-10-CM | POA: Insufficient documentation

## 2012-03-12 DIAGNOSIS — I25810 Atherosclerosis of coronary artery bypass graft(s) without angina pectoris: Secondary | ICD-10-CM | POA: Insufficient documentation

## 2012-03-12 DIAGNOSIS — Z7982 Long term (current) use of aspirin: Secondary | ICD-10-CM | POA: Insufficient documentation

## 2012-03-12 DIAGNOSIS — Z79899 Other long term (current) drug therapy: Secondary | ICD-10-CM | POA: Insufficient documentation

## 2012-03-12 DIAGNOSIS — Z87891 Personal history of nicotine dependence: Secondary | ICD-10-CM | POA: Insufficient documentation

## 2012-03-12 DIAGNOSIS — G4730 Sleep apnea, unspecified: Secondary | ICD-10-CM | POA: Insufficient documentation

## 2012-03-12 DIAGNOSIS — Y929 Unspecified place or not applicable: Secondary | ICD-10-CM | POA: Insufficient documentation

## 2012-03-12 DIAGNOSIS — X58XXXA Exposure to other specified factors, initial encounter: Secondary | ICD-10-CM | POA: Insufficient documentation

## 2012-03-12 DIAGNOSIS — I959 Hypotension, unspecified: Secondary | ICD-10-CM | POA: Insufficient documentation

## 2012-03-12 DIAGNOSIS — E782 Mixed hyperlipidemia: Secondary | ICD-10-CM | POA: Insufficient documentation

## 2012-03-12 DIAGNOSIS — S8990XA Unspecified injury of unspecified lower leg, initial encounter: Secondary | ICD-10-CM | POA: Insufficient documentation

## 2012-03-12 DIAGNOSIS — Z951 Presence of aortocoronary bypass graft: Secondary | ICD-10-CM | POA: Insufficient documentation

## 2012-03-12 MED ORDER — NAPROXEN 250 MG PO TABS
500.0000 mg | ORAL_TABLET | Freq: Once | ORAL | Status: AC
  Administered 2012-03-12: 500 mg via ORAL
  Filled 2012-03-12: qty 2

## 2012-03-12 MED ORDER — MELOXICAM 7.5 MG PO TABS: 7.5000 mg | ORAL_TABLET | Freq: Every day | ORAL | Status: DC

## 2012-03-12 NOTE — ED Notes (Signed)
Patient states he was walking a dog on Saturday and stepped into a hole with his left leg.  Patient states he had back pain on Saturday, but began having left hip and left knee pain on Monday.  States has been using OTC medications with no relief.  Patient states pain is sharp in nature.

## 2012-03-12 NOTE — ED Provider Notes (Addendum)
History     CSN: [id number]  Arrival date & time 03/12/12  0547   First MD Initiated Contact with Patient 03/12/12 [phone]      Chief Complaint  Patient presents with  . Hip Pain  . Knee Pain    (Consider location/radiation/quality/duration/timing/severity/associated sxs/prior treatment) HPI  [name] is a 46 y.o. male who presents to the Emergency Department complaining of left hip, thigh, knee, ankle pain since Saturday when he took a step into a depression in the ground while walking his dog. Began as back pain Saturday which improved. Now with pain that is worse with standing, an aching pain that extends from ankle, to knee, to hip. Able to move hip, knee and ankle.Has taken ibuprofen without sustained relief. No swelling. Denies fever, chills, numbness, tingling.  PCP Dr. Olena Leatherwood Cardiology Dr. Earnestine Leys  Past Medical History  Diagnosis Date  . Hypotension, unspecified   . CAD (coronary artery disease) of artery bypass graft 2009    Status post bypass grafting 2009 , status post stress echo 2012 no ischemia.  . Hyperlipidemia, mixed   . Dermatitis     Left upper extremity  . Sleep apnea     On 8 cm of water CPAP  . Erectile dysfunction     Past Surgical History  Procedure Date  . Coronary artery bypass graft     Family History  Problem Relation Age of Onset  . Kidney disease Other   . Coronary artery disease Other   . Heart failure Other     History  Substance Use Topics  . Smoking status: Former Smoker -- 0.2 packs/day for 5 years    Types: Cigarettes, Cigars    Quit date: 05/07/2004  . Smokeless tobacco: Former Neurosurgeon    Types: Chew    Quit date: 05/07/1997     Comment: chewed tobacco x 20 yrs - quit 2000  . Alcohol Use: Yes      Review of Systems  Constitutional: Negative for fever.       10 Systems reviewed and are negative for acute change except as noted in the HPI.  HENT: Negative for congestion.   Eyes: Negative for discharge and redness.   Respiratory: Negative for cough and shortness of breath.   Cardiovascular: Negative for chest pain.  Gastrointestinal: Negative for vomiting and abdominal pain.  Musculoskeletal: Negative for back pain.       Left hip, leg pain  Skin: Negative for rash.  Neurological: Negative for syncope, numbness and headaches.  Psychiatric/Behavioral:       No behavior change.    Allergies  Penicillins and Sulfonamide derivatives  Home Medications   Current Outpatient Rx  Name  Route  Sig  Dispense  Refill  . ASPIRIN 81 MG PO TABS   Oral   Take 81 mg by mouth daily.           . OMEGA-3 FATTY ACIDS 1000 MG PO CAPS   Oral   Take 1,000 mg by mouth daily.          [name] ONE-DAILY MULTI VITAMINS PO TABS   Oral   Take 1 tablet by mouth daily.           . NON FORMULARY      CPAP Use as directed         . ROSUVASTATIN CALCIUM 40 MG PO TABS   Oral   Take 1 tablet (40 mg total) by mouth daily.   90 tablet  3   . TADALAFIL 10 MG PO TABS      Take one tab before intercourse.   10 tablet   1     BP 135/94  Pulse 77  Temp 97.7 F (36.5 C) (Oral)  Resp 20  Ht 6\' 3"  (1.905 m)  Wt 225 lb (102.059 kg)  BMI 28.12 kg/m2  SpO2 97%  Physical Exam  Nursing note and vitals reviewed. Constitutional: He appears well-developed and well-nourished.       Awake, alert, nontoxic appearance.  HENT:  Head: Atraumatic.  Eyes: Conjunctivae normal and EOM are normal. Right eye exhibits no discharge. Left eye exhibits no discharge.  Neck: Neck supple.  Cardiovascular: Normal heart sounds.   Pulmonary/Chest: Effort normal and breath sounds normal. He exhibits no tenderness.  Abdominal: Soft. There is no tenderness. There is no rebound.  Musculoskeletal: He exhibits no tenderness.       Baseline ROM, no obvious new focal weakness.FROM at hip, knee, ankle. No swelling. No focal tenderness with palpation. DP 2+.   Neurological:       Mental status and motor strength appears baseline for  patient and situation.  Skin: No rash noted.  Psychiatric: He has a normal mood and affect.    ED Course  Procedures (including critical care time)     MDM  Patient with left sided pain from hip to ankle after stepping in a ground depression on Saturday. Given naprosyn. Rx for mobic.Pt stable in ED with no significant deterioration in condition.The patient appears reasonably screened and/or stabilized for discharge and I doubt any other medical condition or other [facility] requiring further screening, evaluation, or treatment in the ED at this time prior to discharge.  MDM Reviewed: nursing note and vitals           Nicoletta Dress. Colon Branch, MD 03/12/12 [id number]  Nicoletta Dress. Colon Branch, MD 03/12/12 [phone]

## 2012-05-07 ENCOUNTER — Other Ambulatory Visit: Payer: Self-pay | Admitting: Cardiology

## 2012-09-04 ENCOUNTER — Encounter: Payer: Self-pay | Admitting: Physician Assistant

## 2012-09-04 ENCOUNTER — Ambulatory Visit (INDEPENDENT_AMBULATORY_CARE_PROVIDER_SITE_OTHER): Payer: PRIVATE HEALTH INSURANCE | Admitting: Physician Assistant

## 2012-09-04 VITALS — BP 123/80 | HR 95 | Ht 75.0 in | Wt 228.4 lb

## 2012-09-04 DIAGNOSIS — E785 Hyperlipidemia, unspecified: Secondary | ICD-10-CM

## 2012-09-04 DIAGNOSIS — I959 Hypotension, unspecified: Secondary | ICD-10-CM

## 2012-09-04 DIAGNOSIS — I25810 Atherosclerosis of coronary artery bypass graft(s) without angina pectoris: Secondary | ICD-10-CM

## 2012-09-04 DIAGNOSIS — G4730 Sleep apnea, unspecified: Secondary | ICD-10-CM

## 2012-09-04 DIAGNOSIS — I2510 Atherosclerotic heart disease of native coronary artery without angina pectoris: Secondary | ICD-10-CM

## 2012-09-04 NOTE — Patient Instructions (Signed)
   No more Zetia Continue all current medications. Your physician wants you to follow up in:  1 year.  You will receive a reminder letter in the mail one-two months in advance.  If you don't receive a letter, please call our office to schedule the follow up appointment

## 2012-09-04 NOTE — Progress Notes (Signed)
Primary Cardiologist: Rollene Rotunda, MD (new)   HPI: Patient presents for annual followup, with history of CABG in 2009 and a NL Dobutamine stress echocardiogram in 2012. He is a former patient of Dr. Andee Lineman, last seen here in clinic in April 2013, at which time beta blocker was discontinued secondary to hypotension with associated dizziness.  Patient denies any interim developmentof exertional CP. He was recently placed on Zetia, per Dr. Olena Leatherwood, following review of a lipid profile. Patient reports today, however, that he was not able to tolerate this medication, due to knee and foot pain and generalized fatigue. His symptoms resolved once he stopped taking this medication, after having been on it for approximately 3 weeks.   Twelve-lead EKG today, reviewed by me, indicates NSR 81 bpm; question old anteroseptal MI; no acute changes  Allergies  Allergen Reactions  . Penicillins   . Sulfonamide Derivatives     REACTION: rash,SOB    Current Outpatient Prescriptions  Medication Sig Dispense Refill  . aspirin 81 MG tablet Take 81 mg by mouth daily.        . cetirizine (ZYRTEC) 10 MG tablet Take 10 mg by mouth daily as needed for allergies.      [name] CRESTOR 40 MG tablet TAKE 1 TABLET DAILY  90 tablet  1  . fish oil-omega-3 fatty acids 1000 MG capsule Take 1,000 mg by mouth daily.       . Multiple Vitamin (MULTIVITAMIN) tablet Take 1 tablet by mouth daily.        . NON FORMULARY CPAP Use as directed       No current facility-administered medications for this visit.    Past Medical History  Diagnosis Date  . Hypotension, unspecified   . CAD (coronary artery disease) of artery bypass graft 2009    Status post bypass grafting 2009 , status post stress echo 2012 no ischemia.  . Hyperlipidemia, mixed   . Dermatitis     Left upper extremity  . Sleep apnea     On 8 cm of water CPAP  . Erectile dysfunction     Past Surgical History  Procedure Laterality Date  . Coronary artery bypass  graft      History   Social History  . Marital Status: Married    Spouse Name: N/A    Number of Children: N/A  . Years of Education: N/A   Occupational History  . Full time [employer]   Social History Main Topics  . Smoking status: Former Smoker -- 0.20 packs/day for 5 years    Types: Cigarettes, Cigars    Quit date: 05/07/2004  . Smokeless tobacco: Former Neurosurgeon    Types: Chew    Quit date: 05/07/1997     Comment: chewed tobacco x 20 yrs - quit 2000  . Alcohol Use: Yes  . Drug Use: No  . Sexually Active: Not on file   Other Topics Concern  . Not on file   Social History Narrative  . No narrative on file    Family History  Problem Relation Age of Onset  . Kidney disease Other   . Coronary artery disease Other   . Heart failure Other     ROS: no nausea, vomiting; no fever, chills; no melena, hematochezia; no claudication  PHYSICAL EXAM: BP 123/80  Pulse 95  Ht 6\' 3"  (1.905 m)  Wt 228 lb 6.4 oz (103.602 kg)  BMI 28.55 kg/m2 GENERAL: 47 year old malemoderately obese; NAD HEENT: NCAT, PERRLA, EOMI; sclera clear; no  xanthelasma NECK: palpable bilateral carotid pulses, no bruits; no JVD; no TM LUNGS: CTA bilaterally CARDIAC: RRR (S1, S2); no significant murmurs; no rubs or gallops ABDOMEN: soft, non-tender; intact BS EXTREMETIES: no significant peripheral edema SKIN: warm/dry; no obvious rash/lesions MUSCULOSKELETAL: no joint deformity NEURO: no focal deficit; NL affect   EKG: reviewed and available in Electronic Records   ASSESSMENT & PLAN:  CAD (coronary artery disease) of artery bypass graft Quiescent on current medication regimen. Normal dobutamine stress echocardiogram in 2012. Schedule return visit in one year, at which time he'll establish with Dr. Antoine Poche.  HYPERLIPIDEMIA-MIXED Patient was advised to not resume Zetia, apart from the fact that he was unable to tolerate this medication. He had an LDL of 72 when we last checked in May 2013. We  will request most recent FLP from Dr. Bartholomew Crews office, and patient is to otherwise continue on full dose Crestor, pending further recommendations.  Hypotension, unspecified Resolved following discontinuation of Lopressor.  Sleep apnea Patient remains compliant with CPAP    Gene Lerlene Treadwell, PAC

## 2012-09-04 NOTE — Assessment & Plan Note (Signed)
-   Patient remains compliant with CPAP 

## 2012-09-04 NOTE — Assessment & Plan Note (Signed)
Quiescent on current medication regimen. Normal dobutamine stress echocardiogram in 2012. Schedule return visit in one year, at which time he'll establish with Dr. Antoine Poche.

## 2012-09-04 NOTE — Assessment & Plan Note (Signed)
Patient was advised to not resume Zetia, apart from the fact that he was unable to tolerate this medication. He had an LDL of 72 when we last checked in May 2013. We will request most recent FLP from Dr. Bartholomew Crews office, and patient is to otherwise continue on full dose Crestor, pending further recommendations.

## 2012-09-04 NOTE — Assessment & Plan Note (Signed)
Resolved following discontinuation of Lopressor.

## 2013-07-10 ENCOUNTER — Other Ambulatory Visit: Payer: Self-pay | Admitting: *Deleted

## 2013-07-10 MED ORDER — ROSUVASTATIN CALCIUM 40 MG PO TABS: 40.0000 mg | ORAL_TABLET | Freq: Every day | ORAL | Status: DC

## 2013-09-08 ENCOUNTER — Encounter: Payer: Self-pay | Admitting: Cardiology

## 2013-09-08 ENCOUNTER — Ambulatory Visit (INDEPENDENT_AMBULATORY_CARE_PROVIDER_SITE_OTHER): Payer: PRIVATE HEALTH INSURANCE | Admitting: Cardiology

## 2013-09-08 VITALS — BP 116/75 | HR 79 | Ht 75.0 in | Wt 220.0 lb

## 2013-09-08 DIAGNOSIS — I10 Essential (primary) hypertension: Secondary | ICD-10-CM

## 2013-09-08 DIAGNOSIS — Z79899 Other long term (current) drug therapy: Secondary | ICD-10-CM

## 2013-09-08 DIAGNOSIS — I25810 Atherosclerosis of coronary artery bypass graft(s) without angina pectoris: Secondary | ICD-10-CM

## 2013-09-08 DIAGNOSIS — E785 Hyperlipidemia, unspecified: Secondary | ICD-10-CM

## 2013-09-08 NOTE — Patient Instructions (Signed)
   Labs for CMET, CBC, FLP, HgA1c - Reminder:  Nothing to eat or drink after 12 midnight prior to labs. Office will contact with results via phone or letter.   Your physician wants you to follow up in:  1 year.  You will receive a reminder letter in the mail one-two months in advance.  If you don't receive a letter, please call our office to schedule the follow up appointment

## 2013-09-08 NOTE — Progress Notes (Signed)
Clinical Summary Mr. Melman is a 48 y.o.male last seen by PA Serpe, this is our first visit together. He is seen for the following medical problems.  1. CAD - CABG in 2009 4 vessel (LIMA-LAD, left radial to OM, SVG-diag, SVG-RCA) - 2012 DSE with baseline LVEF 55-60%, no ischemia  - denies any chest pain. No SOB or DOE. Does heavy lifting regularly at work without troubles - compliant with meds, off beta blocker due to hypotension  2. Hyperlipidemia - previously on zetia by pcp, stopped due to joint pains.  - compliant with crestor  3. OSA - on CPAP   Past Medical History  Diagnosis Date  . Hypotension, unspecified   . CAD (coronary artery disease) of artery bypass graft 2009    Status post bypass grafting 2009 , status post stress echo 2012 no ischemia.  . Hyperlipidemia, mixed   . Dermatitis     Left upper extremity  . Sleep apnea     On 8 cm of water CPAP  . Erectile dysfunction      Allergies  Allergen Reactions  . Penicillins   . Sulfonamide Derivatives     REACTION: rash,SOB     Current Outpatient Prescriptions  Medication Sig Dispense Refill  . aspirin 81 MG tablet Take 81 mg by mouth daily.        . cetirizine (ZYRTEC) 10 MG tablet Take 10 mg by mouth daily as needed for allergies.      . fish oil-omega-3 fatty acids 1000 MG capsule Take 1,000 mg by mouth daily.       . Multiple Vitamin (MULTIVITAMIN) tablet Take 1 tablet by mouth daily.        . NON FORMULARY CPAP Use as directed      . rosuvastatin (CRESTOR) 40 MG tablet Take 1 tablet (40 mg total) by mouth daily.  90 tablet  0   No current facility-administered medications for this visit.     Past Surgical History  Procedure Laterality Date  . Coronary artery bypass graft       Allergies  Allergen Reactions  . Penicillins   . Sulfonamide Derivatives     REACTION: rash,SOB      Family History  Problem Relation Age of Onset  . Kidney disease Other   . Coronary artery disease  Other   . Heart failure Other      Social History Mr. Galli reports that he quit smoking about 9 years ago. His smoking use included Cigarettes and Cigars. He has a 1 pack-year smoking history. He quit smokeless tobacco use about 16 years ago. His smokeless tobacco use included Chew. Mr. Gidney reports that he drinks alcohol.   Review of Systems CONSTITUTIONAL: No weight loss, fever, chills, weakness or fatigue.  HEENT: Eyes: No visual loss, blurred vision, double vision or yellow sclerae.No hearing loss, sneezing, congestion, runny nose or sore throat.  SKIN: No rash or itching.  CARDIOVASCULAR: per HPI RESPIRATORY: No shortness of breath, cough or sputum.  GASTROINTESTINAL: No anorexia, nausea, vomiting or diarrhea. No abdominal pain or blood.  GENITOURINARY: No burning on urination, no polyuria NEUROLOGICAL: No headache, dizziness, syncope, paralysis, ataxia, numbness or tingling in the extremities. No change in bowel or bladder control.  MUSCULOSKELETAL: No muscle, back pain, joint pain or stiffness.  LYMPHATICS: No enlarged nodes. No history of splenectomy.  PSYCHIATRIC: No history of depression or anxiety.  ENDOCRINOLOGIC: No reports of sweating, cold or heat intolerance. No polyuria or polydipsia.  [name]  Physical Examination p 79 bp 116/75 Wt 220 lbs BMI 28 Gen: resting comfortably, no acute distress HEENT: no scleral icterus, pupils equal round and reactive, no palptable cervical adenopathy,  CV: RRR, no m/r/g, no JVD, no carotid bruits Resp: Clear to auscultation bilaterally GI: abdomen is soft, non-tender, non-distended, normal bowel sounds, no hepatosplenomegaly MSK: extremities are warm, no edema.  Skin: warm, no rash Neuro:  no focal deficits Psych: appropriate affect   Diagnostic Studies 12/2007  ANGIOGRAPHIC DATA:  1. The left main is free of critical disease.  2. The LAD courses to the apex. The LAD has multiple lesions  including a 70% proximal mid and 80%  lesion at the takeoff of the  third diagonal. There were then in the distal vessel multiple  lesions including a 90, 70, 50, 90, and 90 in sequence. There is a  70% apical stenosis as well. The first diagonal Oretta Berkland is small-to-  moderate in size with 90% ostial narrowing. Second diagonal is  fairly large in size with 80% proximal and 80% mid narrowing.  Second diagonal does appear to be graftable. The third diagonal  has 90% ostial narrowing.  3. The circumflex has a tiny second marginal with 90% narrowing that  was totally occluded and it started filling late by collaterals.  4. The right coronary artery is totally occluded proximally and fills  in by left-to-right collaterals.  5. Ventriculography in the RAO projection reveals estimated ejection  fraction of 45-50%. There is inferior wall motion abnormality in  the mid inferior wall corresponding the circumflex territory and  anteroapical abnormality, that is mild.  CONCLUSIONS:  1. Total occlusion of the circumflex and right coronary arteries.  2. Minimum 8 successive lesions in the left anterior descending  artery, and including high-grade disease of the large second  diagonal Claiborne Stroble as noted above.  3. Progressive symptoms compatible with angina pectoris.  RECOMMENDATIONS: The LAD is not ideal for grafting. Nonetheless, the  apical vessel possibly could be grafted as well as the large diagonal.  In addition, the marginal and the RCA also appear to be optimal for  grafting. Surgical consultation will be obtained.      Assessment and Plan  1. CAD - no current symptoms - continue secondary prevention and risk factor modifcation  2. Hyperlipidemia - continue crestor, will repeat lipid panel  3. OSA - continue CPAP   F/u 1 year   Arnoldo Lenis, M.D., F.A.C.C.

## 2013-09-15 ENCOUNTER — Other Ambulatory Visit: Payer: Self-pay | Admitting: Cardiology

## 2013-09-15 LAB — COMPREHENSIVE METABOLIC PANEL
ALT: 30 U/L (ref 0–53)
AST: 24 U/L (ref 0–37)
Albumin: 4.3 g/dL (ref 3.5–5.2)
Alkaline Phosphatase: 64 U/L (ref 39–117)
BILIRUBIN TOTAL: 0.5 mg/dL (ref 0.2–1.2)
BUN: 12 mg/dL (ref 6–23)
CALCIUM: 9.4 mg/dL (ref 8.4–10.5)
CHLORIDE: 103 meq/L (ref 96–112)
CO2: 25 meq/L (ref 19–32)
CREATININE: 0.99 mg/dL (ref 0.50–1.35)
GLUCOSE: 107 mg/dL — AB (ref 70–99)
Potassium: 4.3 mEq/L (ref 3.5–5.3)
Sodium: 137 mEq/L (ref 135–145)
Total Protein: 7.1 g/dL (ref 6.0–8.3)

## 2013-09-15 LAB — LIPID PANEL
CHOL/HDL RATIO: 3.6 ratio
CHOLESTEROL: 140 mg/dL (ref 0–200)
HDL: 39 mg/dL — AB (ref >39)
LDL Cholesterol: 66 mg/dL (ref 0–99)
TRIGLYCERIDES: 173 mg/dL — AB (ref <150)
VLDL: 35 mg/dL (ref 0–40)

## 2013-09-17 ENCOUNTER — Encounter: Payer: Self-pay | Admitting: *Deleted

## 2013-11-16 ENCOUNTER — Telehealth: Payer: Self-pay | Admitting: Cardiology

## 2013-11-16 MED ORDER — ROSUVASTATIN CALCIUM 40 MG PO TABS: 40.0000 mg | ORAL_TABLET | Freq: Every day | ORAL | Status: DC

## 2013-11-16 NOTE — Telephone Encounter (Signed)
Needs refill on rosuvastatin (CRESTOR) 40 MG tablet States that pharmacy contacted office last week. Patient needs today.

## 2013-11-16 NOTE — Telephone Encounter (Signed)
Patient notified rx sent to [facility] .

## 2013-11-17 ENCOUNTER — Ambulatory Visit (INDEPENDENT_AMBULATORY_CARE_PROVIDER_SITE_OTHER): Payer: PRIVATE HEALTH INSURANCE | Admitting: Sports Medicine

## 2013-11-17 ENCOUNTER — Encounter: Payer: Self-pay | Admitting: Sports Medicine

## 2013-11-17 VITALS — BP 123/71 | HR 84 | Ht 75.0 in | Wt 221.0 lb

## 2013-11-17 DIAGNOSIS — L0292 Furuncle, unspecified: Secondary | ICD-10-CM

## 2013-11-17 DIAGNOSIS — H6123 Impacted cerumen, bilateral: Secondary | ICD-10-CM

## 2013-11-17 DIAGNOSIS — H6120 Impacted cerumen, unspecified ear: Secondary | ICD-10-CM | POA: Insufficient documentation

## 2013-11-17 DIAGNOSIS — J0100 Acute maxillary sinusitis, unspecified: Secondary | ICD-10-CM

## 2013-11-17 DIAGNOSIS — G4733 Obstructive sleep apnea (adult) (pediatric): Secondary | ICD-10-CM

## 2013-11-17 DIAGNOSIS — E785 Hyperlipidemia, unspecified: Secondary | ICD-10-CM

## 2013-11-17 DIAGNOSIS — G4730 Sleep apnea, unspecified: Secondary | ICD-10-CM

## 2013-11-17 DIAGNOSIS — I10 Essential (primary) hypertension: Secondary | ICD-10-CM

## 2013-11-17 DIAGNOSIS — L0293 Carbuncle, unspecified: Secondary | ICD-10-CM

## 2013-11-17 DIAGNOSIS — J309 Allergic rhinitis, unspecified: Secondary | ICD-10-CM | POA: Insufficient documentation

## 2013-11-17 MED ORDER — MUPIROCIN CALCIUM 2 % NA OINT: 1.0000 "application " | TOPICAL_OINTMENT | Freq: Two times a day (BID) | NASAL | Status: DC

## 2013-11-17 MED ORDER — CHLORHEXIDINE GLUCONATE 4 % EX LIQD: CUTANEOUS | Status: DC

## 2013-11-17 MED ORDER — FLUTICASONE PROPIONATE 50 MCG/ACT NA SUSP: NASAL | Status: DC

## 2013-11-17 MED ORDER — AZITHROMYCIN 250 MG PO TABS: ORAL_TABLET | ORAL | Status: DC

## 2013-11-17 MED ORDER — CARBAMIDE PEROXIDE 6.5 % OT SOLN: 10.0000 [drp] | Freq: Two times a day (BID) | OTIC | Status: DC

## 2013-11-17 NOTE — Assessment & Plan Note (Signed)
Diet controlled.  

## 2013-11-17 NOTE — Assessment & Plan Note (Signed)
Continue CPAP.  

## 2013-11-17 NOTE — Assessment & Plan Note (Signed)
Continue Crestor 

## 2013-11-17 NOTE — Assessment & Plan Note (Addendum)
Removed by physician with curette and flushing. Bilateral. Debrox.

## 2013-11-17 NOTE — Patient Instructions (Signed)
Chlorhexidine topical antiseptic What is this medicine? CHLORHEXIDINE (klor HEX i deen) is used as a skin wound cleanser and a general skin cleanser. This medicine is also used as a surgical hand scrub and to cleanse the skin before surgery to help prevent infections. This medicine may be used for other purposes; ask your health care provider or pharmacist if you have questions. COMMON BRAND NAME(S): Betasept, Chlorostat, Hibiclens What should I tell my health care provider before I take this medicine? They need to know if you have any of the following conditions: -any skin rashes or problems -an unusual or allergic reaction to chlorhexidine, other medicines, foods, dyes, or preservatives -pregnant or trying to get pregnant -breast-feeding How should I use this medicine? This medicine is for external use only. Do not take by mouth. Follow the directions on the label or those given to you by your doctor or health care professional. Keep out of eyes, ears and mouth. This medicine should not be used as a preoperative skin preparation of the face or head. Talk to your pediatrician regarding the use of this medicine in children. While this medicine may be used for children for selected conditions, precautions do apply. Overdosage: If you think you have taken too much of this medicine contact a poison control center or emergency room at once. NOTE: This medicine is only for you. Do not share this medicine with others. What if I miss a dose? This does not apply; this medicine is not for regular use. What may interact with this medicine? Interactions are not expected. This list may not describe all possible interactions. Give your health care provider a list of all the medicines, herbs, non-prescription drugs, or dietary supplements you use. Also tell them if you smoke, drink alcohol, or use illegal drugs. Some items may interact with your medicine. What should I watch for while using this  medicine? This medicine may cause severe allergic reactions. Notify a health care professional right away if you think you are having an allergic reaction. Do not take this medicine by mouth. Avoid contact with your ears and eyes. If contact with the eyes occur, rinse the eyes well with plenty of cool tap water. What side effects may I notice from receiving this medicine? Side effects that you should report to your doctor or other health care professional as soon as possible: -allergic reactions like skin rash, itching or hives, swelling of the face, lips, or tongue -breathing problems -cough Side effects that usually do not require medical attention (report to your doctor or other health care professional if they continue or are bothersome): -increased sensitivity to the sun -skin irritation This list may not describe all possible side effects. Call your doctor for medical advice about side effects. You may report side effects to FDA at 1-800-FDA-1088. Where should I keep my medicine? Keep out of the reach of children. Store at room temperature between 15 and 30 degrees C (59 and 86 degrees F). Store away from direct light and heat. Do not freeze. Throw away any unused medicine after the expiration date. NOTE: This sheet is a summary. It may not cover all possible information. If you have questions about this medicine, talk to your doctor, pharmacist, or health care provider.  2015, [employer]. (2007-07-30 16:55:34)

## 2013-11-17 NOTE — Progress Notes (Signed)
  Subjective:    CC: Establish care.   HPI:  CAD: Post quadruple bypass, for followed by [facility] cardiology, no further chest pain.  Cerumen impaction: New onset, difficulty hearing.  Hypertension: Well controlled. With diet.  Hyperlipidemia: Stable on Crestor.  Skin lesions: Multiple, localized in the legs and groin, mildly pruritic but predominantly painful, they were resolved with a single course of antibiotics given by another provider.  Sinusitis: Pain and pressure of the maxillary sinuses with purulent nasal discharge.  Sleep apnea: Stable on CPAP.  Past medical history, Surgical history, Family history not pertinant except as noted below, Social history, Allergies, and medications have been entered into the medical record, reviewed, and no changes needed.   Review of Systems: No headache, visual changes, nausea, vomiting, diarrhea, constipation, dizziness, abdominal pain, skin rash, fevers, chills, night sweats, swollen lymph nodes, weight loss, chest pain, body aches, joint swelling, muscle aches, shortness of breath, mood changes, visual or auditory hallucinations.  Objective:    General: Well Developed, well nourished, and in no acute distress.  Neuro: Alert and oriented x3, extra-ocular muscles intact, sensation grossly intact.  HEENT: Normocephalic, atraumatic, pupils equal round reactive to light, neck supple, no masses, no lymphadenopathy, thyroid nonpalpable.  Skin: Warm and dry, no rashes noted. Multiple furuncles noted on the legs and groin. Cardiac: Regular rate and rhythm, no murmurs rubs or gallops.  Respiratory: Clear to auscultation bilaterally. Not using accessory muscles, speaking in full sentences.  Abdominal: Soft, nontender, nondistended, positive bowel sounds, no masses, no organomegaly.  Musculoskeletal: Shoulder, elbow, wrist, hip, knee, ankle stable, and with full range of motion.  Impression and Recommendations:    The patient was counselled, risk  factors were discussed, anticipatory guidance given.

## 2013-11-17 NOTE — Assessment & Plan Note (Signed)
Mupirocin and Hibiclens.

## 2013-11-17 NOTE — Assessment & Plan Note (Signed)
Azithromycin, Flonase 

## 2014-01-26 ENCOUNTER — Ambulatory Visit (INDEPENDENT_AMBULATORY_CARE_PROVIDER_SITE_OTHER): Payer: PRIVATE HEALTH INSURANCE | Admitting: Family Medicine

## 2014-01-26 ENCOUNTER — Encounter: Payer: Self-pay | Admitting: Family Medicine

## 2014-01-26 VITALS — BP 128/78 | HR 83 | Temp 98.3°F | Wt 222.0 lb

## 2014-01-26 DIAGNOSIS — A499 Bacterial infection, unspecified: Secondary | ICD-10-CM

## 2014-01-26 DIAGNOSIS — B9689 Other specified bacterial agents as the cause of diseases classified elsewhere: Secondary | ICD-10-CM

## 2014-01-26 DIAGNOSIS — J329 Chronic sinusitis, unspecified: Secondary | ICD-10-CM

## 2014-01-26 MED ORDER — DOXYCYCLINE HYCLATE 100 MG PO TABS: ORAL_TABLET | ORAL | Status: AC

## 2014-01-26 NOTE — Progress Notes (Signed)
CC: [name] is a 48 y.o. male is here for URI   Subjective: HPI:  Patient complains of facial pressure localize any both eyes along with thick nasal discharge and subjective postnasal drip that has been present for the past week worsening on a daily basis. Declined last night when he became accompanied by dry cough, subjective fevers and chills with night sweats. Pressure and discharge are slightly improved after taking pseudoephedrine today. No other interventions as of yet. Denies wheezing, blood and sputum, shortness of breath, chest pain, motor or sensory disturbances   Review Of Systems Outlined In HPI  Past Medical History  Diagnosis Date  . Hypotension, unspecified   . CAD (coronary artery disease) of artery bypass graft 2009    Status post bypass grafting 2009 , status post stress echo 2012 no ischemia.  . Hyperlipidemia, mixed   . Dermatitis     Left upper extremity  . Sleep apnea     On 8 cm of water CPAP  . Erectile dysfunction     Past Surgical History  Procedure Laterality Date  . Coronary artery bypass graft     Family History  Problem Relation Age of Onset  . Kidney disease Other   . Coronary artery disease Other   . Heart failure Other     History   Social History  . Marital Status: Married    Spouse Name: N/A    Number of Children: N/A  . Years of Education: N/A   Occupational History  . Full time [employer]   Social History Main Topics  . Smoking status: Former Smoker -- 0.20 packs/day for 5 years    Types: Cigarettes, Cigars    Quit date: 05/07/2005  . Smokeless tobacco: Former Systems developer    Types: [place] date: 05/07/1997     Comment: chewed tobacco x 20 yrs - quit 2000  . Alcohol Use: 2.5 oz/week    5 drink(s) per week  . Drug Use: No  . Sexual Activity: Yes    Partners: Female   Other Topics Concern  . Not on file   Social History Narrative  . No narrative on file     Objective: BP 128/78  Pulse 83  Temp(Src) 98.3 F  (36.8 C) (Oral)  Wt 222 lb (100.699 kg)  General: Alert and Oriented, No Acute Distress HEENT: Pupils equal, round, reactive to light. Conjunctivae clear.  External ears unremarkable, canals clear with intact TMs with appropriate landmarks.  Right Middle ear appears open without effusion left middle ear with mild serous effusion. Pink inferior turbinates.  Moist mucous membranes, pharynx without inflammation nor lesions.  Neck supple without palpable lymphadenopathy nor abnormal masses. Lungs: Clear to auscultation bilaterally, no wheezing/ronchi/rales.  Comfortable work of breathing. Good air movement. Extremities: No peripheral edema.  Strong peripheral pulses.  Mental Status: No depression, anxiety, nor agitation. Skin: Warm and dry.  Assessment & Plan: Kallen was seen today for uri.  Diagnoses and associated orders for this visit:  Bacterial sinusitis - doxycycline (VIBRA-TABS) 100 MG tablet; One by mouth twice a day for ten days.    Bacterial sinusitis: Start doxycycline consider nasal saline washes along with Alka-Seltzer cold and sinus  Return if symptoms worsen or fail to improve.

## 2014-05-07 HISTORY — PX: COLONOSCOPY: SHX174

## 2014-05-20 ENCOUNTER — Ambulatory Visit (INDEPENDENT_AMBULATORY_CARE_PROVIDER_SITE_OTHER): Payer: PRIVATE HEALTH INSURANCE | Admitting: Sports Medicine

## 2014-05-20 ENCOUNTER — Encounter: Payer: Self-pay | Admitting: Sports Medicine

## 2014-05-20 VITALS — BP 114/72 | HR 81 | Ht 75.0 in | Wt 226.0 lb

## 2014-05-20 DIAGNOSIS — I10 Essential (primary) hypertension: Secondary | ICD-10-CM

## 2014-05-20 DIAGNOSIS — Z0000 Encounter for general adult medical examination without abnormal findings: Secondary | ICD-10-CM

## 2014-05-20 DIAGNOSIS — E785 Hyperlipidemia, unspecified: Secondary | ICD-10-CM

## 2014-05-20 DIAGNOSIS — J0100 Acute maxillary sinusitis, unspecified: Secondary | ICD-10-CM

## 2014-05-20 DIAGNOSIS — G4730 Sleep apnea, unspecified: Secondary | ICD-10-CM

## 2014-05-20 MED ORDER — DOXYCYCLINE HYCLATE 100 MG PO TABS: 100.0000 mg | ORAL_TABLET | Freq: Two times a day (BID) | ORAL | Status: AC

## 2014-05-20 NOTE — Assessment & Plan Note (Signed)
Stable and well-controlled, no changes.

## 2014-05-20 NOTE — Assessment & Plan Note (Signed)
Stable, no changes. Lipid panel and liver function has been checked history by his cardiologist.

## 2014-05-20 NOTE — Assessment & Plan Note (Signed)
61 is approaching, referral to gastroenterology for screening colonoscopy. He did have a colonoscopy approximately 9 years ago for diverticulitis.

## 2014-05-20 NOTE — Assessment & Plan Note (Signed)
Stable, no changes  

## 2014-05-20 NOTE — Progress Notes (Signed)
  Subjective:    CC: Follow-up  HPI: Sleep apnea: Stable  Hyperlipidemia: Stable  Hypertension: Stable  Preventive measures: Coming up to age 49, he will need a colonoscopy.  Sinus infection: Tender to palpation with pressure over the maxillary sinuses, nasal discharge, symptoms are moderate, persistent.  Past medical history, Surgical history, Family history not pertinant except as noted below, Social history, Allergies, and medications have been entered into the medical record, reviewed, and no changes needed.   Review of Systems: No fevers, chills, night sweats, weight loss, chest pain, or shortness of breath.   Objective:    General: Well Developed, well nourished, and in no acute distress.  Neuro: Alert and oriented x3, extra-ocular muscles intact, sensation grossly intact.  HEENT: Normocephalic, atraumatic, pupils equal round reactive to light, neck supple, no masses, no lymphadenopathy, thyroid nonpalpable. Oropharynx, nasopharynx, ear canals unremarkable, mild discomfort with palpation of the maxillary sinuses. Skin: Warm and dry, no rashes. Cardiac: Regular rate and rhythm, no murmurs rubs or gallops, no lower extremity edema.  Respiratory: Clear to auscultation bilaterally. Not using accessory muscles, speaking in full sentences.  Impression and Recommendations:

## 2014-05-20 NOTE — Assessment & Plan Note (Signed)
Doxycycline, Flonase.  

## 2014-06-23 ENCOUNTER — Encounter: Payer: Self-pay | Admitting: Internal Medicine

## 2014-08-19 ENCOUNTER — Ambulatory Visit (AMBULATORY_SURGERY_CENTER): Payer: Self-pay

## 2014-08-19 VITALS — Ht 75.0 in | Wt 229.6 lb

## 2014-08-19 DIAGNOSIS — Z800 Family history of malignant neoplasm of digestive organs: Secondary | ICD-10-CM

## 2014-08-19 NOTE — Progress Notes (Signed)
Per pt, no allergies to soy or egg products.Pt not taking any weight loss meds or using  O2 at home. 

## 2014-09-02 ENCOUNTER — Encounter: Payer: Self-pay | Admitting: Internal Medicine

## 2014-09-02 ENCOUNTER — Ambulatory Visit (AMBULATORY_SURGERY_CENTER): Payer: PRIVATE HEALTH INSURANCE | Admitting: Internal Medicine

## 2014-09-02 VITALS — BP 135/82 | HR 68 | Temp 97.6°F | Resp 16 | Ht 75.0 in | Wt 229.0 lb

## 2014-09-02 DIAGNOSIS — Z86010 Personal history of colonic polyps: Secondary | ICD-10-CM

## 2014-09-02 DIAGNOSIS — D128 Benign neoplasm of rectum: Secondary | ICD-10-CM | POA: Diagnosis not present

## 2014-09-02 DIAGNOSIS — D124 Benign neoplasm of descending colon: Secondary | ICD-10-CM

## 2014-09-02 DIAGNOSIS — D129 Benign neoplasm of anus and anal canal: Secondary | ICD-10-CM

## 2014-09-02 DIAGNOSIS — Z860101 Personal history of adenomatous and serrated colon polyps: Secondary | ICD-10-CM | POA: Insufficient documentation

## 2014-09-02 DIAGNOSIS — Z1211 Encounter for screening for malignant neoplasm of colon: Secondary | ICD-10-CM

## 2014-09-02 DIAGNOSIS — D122 Benign neoplasm of ascending colon: Secondary | ICD-10-CM

## 2014-09-02 HISTORY — DX: Personal history of adenomatous and serrated colon polyps: Z86.0101

## 2014-09-02 HISTORY — DX: Personal history of colonic polyps: Z86.010

## 2014-09-02 MED ORDER — SODIUM CHLORIDE 0.9 % IV SOLN: 500.0000 mL | INTRAVENOUS | Status: DC

## 2014-09-02 NOTE — Op Note (Signed)
[place]  [address] [place], [id number]   COLONOSCOPY PROCEDURE REPORT  PATIENT: [name], [name]  MR#: [id number] BIRTHDATE: 1965/10/15 , 49  yrs. old GENDER: male ENDOSCOPIST: Gatha Mayer, MD, [facility] PROCEDURE DATE:  09/02/2014 PROCEDURE:   Colonoscopy, screening and Colonoscopy with snare polypectomy First Screening Colonoscopy - Avg.  risk and is 50 yrs.  old or older Yes.  Prior Negative Screening - Now for repeat screening. N/A  History of Adenoma - Now for follow-up colonoscopy & has been > or = to 3 yrs.  N/A ASA CLASS:   Class II INDICATIONS:Screening for colonic neoplasia and Colorectal Neoplasm Risk Assessment for this procedure is average risk. MEDICATIONS: Propofol 300 mg IV and Monitored anesthesia care  DESCRIPTION OF PROCEDURE:   After the risks benefits and alternatives of the procedure were thoroughly explained, informed consent was obtained.  The digital rectal exam revealed no abnormalities of the rectum, revealed no prostatic nodules, and revealed the prostate was not enlarged.   The LB [id number]-H190 [id number] endoscope was introduced through the anus and advanced to the cecum, which was identified by both the appendix and ileocecal valve. No adverse events experienced.   The quality of the prep was good.  (MiraLax was used)  The instrument was then slowly withdrawn as the colon was fully examined.  COLON FINDINGS: Five sessile polyps ranging from 2 to 55mm in size were found in the ascending colon (2), descending colon, and rectum (2).  Polypectomies were performed using snare cautery (10 mm descending) and with a cold snare (others).  The resection was complete, the polyp tissue was completely retrieved and sent to histology.   There was mild diverticulosis noted in the sigmoid colon.   The examination was otherwise normal.  Retroflexed views revealed no abnormalities. The time to cecum = 5.0 Withdrawal time = 15.3   The scope was  withdrawn and the procedure completed. COMPLICATIONS: There were no immediate complications.  ENDOSCOPIC IMPRESSION: 1.   Five sessile polyps ranging from 2 to 81mm in size were found in the ascending colon, descending colon, and rectum; polypectomies were performed using snare cautery and with a cold snare 2.   There was mild diverticulosis noted in the sigmoid colon 3.   The examination was otherwise normal - good prep first screening   RECOMMENDATIONS: 1.  Hold Aspirin and all other NSAIDS for 2 weeks. 2.  Timing of repeat colonoscopy will be determined by pathology findings.  eSigned:  Gatha Mayer, MD, [facility] 09/02/2014 10:16 AM   cc: The Patient and Dr, T. Thekkekandam   PATIENT NAME:  [name], [name] MR#: [id number]

## 2014-09-02 NOTE — Progress Notes (Signed)
Called to room to assist during endoscopic procedure.  Patient ID and intended procedure confirmed with present staff. Received instructions for my participation in the procedure from the performing physician.  

## 2014-09-02 NOTE — Progress Notes (Signed)
Report to PACU, RN, vss, BBS= Clear.  

## 2014-09-02 NOTE — Patient Instructions (Addendum)
I found and removed 5 polyps - all look benign.  I will let you know pathology results and when to have another routine colonoscopy by mail.  I appreciate the opportunity to care for you. Gatha Mayer, MD, FACG    YOU HAD AN ENDOSCOPIC PROCEDURE TODAY AT [facility]:   Refer to the procedure report that was given to you for any specific questions about what was found during the examination.  If the procedure report does not answer your questions, please call your gastroenterologist to clarify.  If you requested that your care partner not be given the details of your procedure findings, then the procedure report has been included in a sealed envelope for you to review at your convenience later.  YOU SHOULD EXPECT: Some feelings of bloating in the abdomen. Passage of more gas than usual.  Walking can help get rid of the air that was put into your GI tract during the procedure and reduce the bloating. If you had a lower endoscopy (such as a colonoscopy or flexible sigmoidoscopy) you may notice spotting of blood in your stool or on the toilet paper. If you underwent a bowel prep for your procedure, you may not have a normal bowel movement for a few days.  Please Note:  You might notice some irritation and congestion in your nose or some drainage.  This is from the oxygen used during your procedure.  There is no need for concern and it should clear up in a day or so.  SYMPTOMS TO REPORT IMMEDIATELY:   Following lower endoscopy (colonoscopy or flexible sigmoidoscopy):  Excessive amounts of blood in the stool  Significant tenderness or worsening of abdominal pains  Swelling of the abdomen that is new, acute  Fever of 100F or higher    For urgent or emergent issues, a gastroenterologist can be reached at any hour by calling [phone].   DIET: Your first meal following the procedure should be a small meal and then it is ok to progress to your normal diet. Heavy  or fried foods are harder to digest and may make you feel nauseous or bloated.  Likewise, meals heavy in dairy and vegetables can increase bloating.  Drink plenty of fluids but you should avoid alcoholic beverages for 24 hours.  ACTIVITY:  You should plan to take it easy for the rest of today and you should NOT DRIVE or use heavy machinery until tomorrow (because of the sedation medicines used during the test).    FOLLOW UP: Our staff will call the number listed on your records the next business day following your procedure to check on you and address any questions or concerns that you may have regarding the information given to you following your procedure. If we do not reach you, we will leave a message.  However, if you are feeling well and you are not experiencing any problems, there is no need to return our call.  We will assume that you have returned to your regular daily activities without incident.  If any biopsies were taken you will be contacted by phone or by letter within the next 1-3 weeks.  Please call us at [phone] if you have not heard about the biopsies in 3 weeks.    SIGNATURES/CONFIDENTIALITY: You and/or your care partner have signed paperwork which will be entered into your electronic medical record.  These signatures attest to the fact that that the information above on your After Visit Summary has  been reviewed and is understood.  Full responsibility of the confidentiality of this discharge information lies with you and/or your care-partner.   INFORMATION ON POLYPS AND DIVERTICULOSIS GIVEN TO YOU TODAY

## 2014-09-08 ENCOUNTER — Encounter: Payer: Self-pay | Admitting: Cardiology

## 2014-09-08 ENCOUNTER — Ambulatory Visit (INDEPENDENT_AMBULATORY_CARE_PROVIDER_SITE_OTHER): Payer: PRIVATE HEALTH INSURANCE | Admitting: Cardiology

## 2014-09-08 VITALS — BP 105/69 | HR 80 | Ht 75.0 in | Wt 225.0 lb

## 2014-09-08 DIAGNOSIS — E785 Hyperlipidemia, unspecified: Secondary | ICD-10-CM

## 2014-09-08 DIAGNOSIS — I25700 Atherosclerosis of coronary artery bypass graft(s), unspecified, with unstable angina pectoris: Secondary | ICD-10-CM | POA: Diagnosis not present

## 2014-09-08 DIAGNOSIS — R7309 Other abnormal glucose: Secondary | ICD-10-CM | POA: Diagnosis not present

## 2014-09-08 NOTE — Progress Notes (Signed)
Clinical Summary Mr. Buckle is a 49 y.o.male seen today for follow up of the following medical problems.   1. CAD - CABG in 2009 4 vessel (LIMA-LAD, left radial to OM, SVG-diag, SVG-RCA) - 2012 DSE with baseline LVEF 55-60%, no ischemia  - no chest pain, no SOB, no DOE - compliant with meds. Medical therapy limited due to low bp's, has not been on beta blocker or ACE-I  2. Hyperlipidemia - compliant with crestor   3. OSA - on CPAP Past Medical History  Diagnosis Date  . Hypotension, unspecified   . CAD (coronary artery disease) of artery bypass graft 2009    Status post bypass grafting 2009 , status post stress echo 2012 no ischemia.  . Hyperlipidemia, mixed   . Dermatitis     Left upper extremity  . Sleep apnea     On 8 cm of water CPAP  . Erectile dysfunction   . Myocardial infarction     per pt had 2 MI     Allergies  Allergen Reactions  . Penicillins     Unsure of reaction  . Sulfonamide Derivatives     REACTION: rash,SOB,hives     Current Outpatient Prescriptions  Medication Sig Dispense Refill  . aspirin 81 MG tablet Take 81 mg by mouth daily.      . carbamide peroxide (DEBROX) 6.5 % otic solution Place 10 drops into both ears 2 (two) times daily. Dispense 1 bottle (Patient taking differently: Place 10 drops into both ears as needed. Dispense 1 bottle) 15 mL 11  . chlorhexidine (HIBICLENS) 4 % external liquid Apply topically daily for a week (Patient not taking: Reported on 08/19/2014) 946 mL 11  . cyclobenzaprine (FLEXERIL) 5 MG tablet   0  . fluticasone (FLONASE) 50 MCG/ACT nasal spray One spray in each nostril twice a day, use left hand for right nostril, and right hand for left nostril. 48 g 3  . ibuprofen (ADVIL,MOTRIN) 800 MG tablet   0  . Multiple Vitamin (MULTIVITAMIN) tablet Take 1 tablet by mouth daily.    . mupirocin nasal ointment (BACTROBAN) 2 % Place 1 application into the nose 2 (two) times daily. Apply to both nares BID for 5 days.  (Patient not taking: Reported on 08/19/2014) 30 g 3  . Omega-3 Fatty Acids (FISH OIL) 1200 MG CAPS Take 1 capsule by mouth daily.    . rosuvastatin (CRESTOR) 40 MG tablet Take 1 tablet (40 mg total) by mouth daily. 90 tablet 3   No current facility-administered medications for this visit.     Past Surgical History  Procedure Laterality Date  . Coronary artery bypass graft  2009     Allergies  Allergen Reactions  . Penicillins     Unsure of reaction  . Sulfonamide Derivatives     REACTION: rash,SOB,hives      Family History  Problem Relation Age of Onset  . Kidney disease Other   . Coronary artery disease Other   . Heart failure Other   . Heart disease Mother   . Heart disease Father   . Colon cancer Paternal Uncle   . Esophageal cancer Neg Hx   . Prostate cancer Neg Hx   . Rectal cancer Neg Hx   . Stomach cancer Neg Hx      Social History Mr. Banfill reports that he quit smoking about 9 years ago. His smoking use included Cigarettes and Cigars. He has a 1 pack-year smoking history. He quit smokeless tobacco  use about 17 years ago. His smokeless tobacco use included Chew. Mr. Kashuba reports that he drinks about 9.0 oz of alcohol per week.   Review of Systems CONSTITUTIONAL: No weight loss, fever, chills, weakness or fatigue.  HEENT: Eyes: No visual loss, blurred vision, double vision or yellow sclerae.No hearing loss, sneezing, congestion, runny nose or sore throat.  SKIN: No rash or itching.  CARDIOVASCULAR: per HPI RESPIRATORY: No shortness of breath, cough or sputum.  GASTROINTESTINAL: No anorexia, nausea, vomiting or diarrhea. No abdominal pain or blood.  GENITOURINARY: No burning on urination, no polyuria NEUROLOGICAL: No headache, dizziness, syncope, paralysis, ataxia, numbness or tingling in the extremities. No change in bowel or bladder control.  MUSCULOSKELETAL: No muscle, back pain, joint pain or stiffness.  LYMPHATICS: No enlarged nodes. No history of  splenectomy.  PSYCHIATRIC: No history of depression or anxiety.  ENDOCRINOLOGIC: No reports of sweating, cold or heat intolerance. No polyuria or polydipsia.  [name]   Physical Examination p 80 bp 105/69 Wt 225 lbs BMI 28 Gen: resting comfortably, no acute distress HEENT: no scleral icterus, pupils equal round and reactive, no palptable cervical adenopathy,  CV: RRR, no m/r/g, no JVD, no carotid bruits Resp: Clear to auscultation bilaterally GI: abdomen is soft, non-tender, non-distended, normal bowel sounds, no hepatosplenomegaly MSK: extremities are warm, no edema.  Skin: warm, no rash Neuro:  no focal deficits Psych: appropriate affect   Diagnostic Studies 12/2007 ANGIOGRAPHIC DATA:  1. The left main is free of critical disease.  2. The LAD courses to the apex. The LAD has multiple lesions  including a 70% proximal mid and 80% lesion at the takeoff of the  third diagonal. There were then in the distal vessel multiple  lesions including a 90, 70, 50, 90, and 90 in sequence. There is a  70% apical stenosis as well. The first diagonal Joshu Furukawa is small-to-  moderate in size with 90% ostial narrowing. Second diagonal is  fairly large in size with 80% proximal and 80% mid narrowing.  Second diagonal does appear to be graftable. The third diagonal  has 90% ostial narrowing.  3. The circumflex has a tiny second marginal with 90% narrowing that  was totally occluded and it started filling late by collaterals.  4. The right coronary artery is totally occluded proximally and fills  in by left-to-right collaterals.  5. Ventriculography in the RAO projection reveals estimated ejection  fraction of 45-50%. There is inferior wall motion abnormality in  the mid inferior wall corresponding the circumflex territory and  anteroapical abnormality, that is mild.  CONCLUSIONS:  1. Total occlusion of the circumflex and right coronary arteries.  2. Minimum 8 successive lesions in  the left anterior descending  artery, and including high-grade disease of the large second  diagonal Zitlali Primm as noted above.  3. Progressive symptoms compatible with angina pectoris.  RECOMMENDATIONS: The LAD is not ideal for grafting. Nonetheless, the  apical vessel possibly could be grafted as well as the large diagonal.  In addition, the marginal and the RCA also appear to be optimal for  grafting. Surgical consultation will be obtained.    Assessment and Plan  1. CAD - no current symptoms - continue secondary prevention and risk factor modifcation  2. Hyperlipidemia - continue crestor - repeat lipid panel  3. OSA - continue CPAP   F/u 1 year   Arnoldo Lenis, M.D.

## 2014-09-08 NOTE — Patient Instructions (Signed)
Your physician wants you to follow-up in: 1 year with Dr. Bryna Colander will receive a reminder letter in the mail two months in advance. If you don't receive a letter, please call our office to schedule the follow-up appointment.  Your physician recommends that you continue on your current medications as directed. Please refer to the Current Medication list given to you today.  Your physician recommends that you return for lab work LIPIDS/HGBA1C/CBC/CMP/MAG/TSH  Thank you for choosing [facility]!!

## 2014-09-09 ENCOUNTER — Other Ambulatory Visit: Payer: Self-pay | Admitting: Cardiology

## 2014-09-09 LAB — CBC
HCT: 45 % (ref 39.0–52.0)
HEMOGLOBIN: 15.6 g/dL (ref 13.0–17.0)
MCH: 29.4 pg (ref 26.0–34.0)
MCHC: 34.7 g/dL (ref 30.0–36.0)
MCV: 84.9 fL (ref 78.0–100.0)
MPV: 9.8 fL (ref 8.6–12.4)
PLATELETS: 308 10*3/uL (ref 150–400)
RBC: 5.3 MIL/uL (ref 4.22–5.81)
RDW: 13.4 % (ref 11.5–15.5)
WBC: 9.1 10*3/uL (ref 4.0–10.5)

## 2014-09-09 LAB — COMPREHENSIVE METABOLIC PANEL
ALT: 35 U/L (ref 0–53)
AST: 26 U/L (ref 0–37)
Albumin: 4.4 g/dL (ref 3.5–5.2)
Alkaline Phosphatase: 64 U/L (ref 39–117)
BUN: 16 mg/dL (ref 6–23)
CO2: 22 mEq/L (ref 19–32)
Calcium: 9.3 mg/dL (ref 8.4–10.5)
Chloride: 104 mEq/L (ref 96–112)
Creat: 0.98 mg/dL (ref 0.50–1.35)
Glucose, Bld: 106 mg/dL — ABNORMAL HIGH (ref 70–99)
POTASSIUM: 4.1 meq/L (ref 3.5–5.3)
Sodium: 137 mEq/L (ref 135–145)
TOTAL PROTEIN: 7.1 g/dL (ref 6.0–8.3)
Total Bilirubin: 0.4 mg/dL (ref 0.2–1.2)

## 2014-09-09 LAB — LIPID PANEL
CHOL/HDL RATIO: 3.8 ratio
Cholesterol: 119 mg/dL (ref 0–200)
HDL: 31 mg/dL — AB (ref >=40)
LDL Cholesterol: 61 mg/dL (ref 0–99)
TRIGLYCERIDES: 135 mg/dL (ref <150)
VLDL: 27 mg/dL (ref 0–40)

## 2014-09-09 LAB — MAGNESIUM: Magnesium: 1.9 mg/dL (ref 1.5–2.5)

## 2014-09-10 LAB — HEMOGLOBIN A1C
HEMOGLOBIN A1C: 5.8 % — AB (ref <5.7)
MEAN PLASMA GLUCOSE: 120 mg/dL — AB (ref <117)

## 2014-09-10 LAB — TSH: TSH: 1.996 u[IU]/mL (ref 0.350–4.500)

## 2014-09-14 ENCOUNTER — Encounter: Payer: Self-pay | Admitting: Internal Medicine

## 2014-09-14 ENCOUNTER — Telehealth: Payer: Self-pay | Admitting: *Deleted

## 2014-09-14 DIAGNOSIS — Z86010 Personal history of colonic polyps: Secondary | ICD-10-CM

## 2014-09-14 NOTE — Telephone Encounter (Signed)
Pt made aware, forwarded to pcp 

## 2014-09-14 NOTE — Telephone Encounter (Signed)
-----   Message from Laurine Blazer, LPN sent at 12/12/2759  2:01 PM EDT -----   ----- Message -----    From: Arnoldo Lenis, MD    Sent: 09/10/2014  12:22 PM      To: Laurine Blazer, LPN  Labs overall look good. ONe blood test shows the very early signs of prediabetes (aka prediabetes, not full blown diabetes). Needs to work on diet, exercise, and weight loss to help improve this  Zandra Abts MD

## 2014-09-14 NOTE — Progress Notes (Signed)
Quick Note:  5 adenomas max 10 mm Repeat colonoscopy 2019 ______

## 2014-12-16 ENCOUNTER — Encounter: Payer: Self-pay | Admitting: Family Medicine

## 2014-12-16 ENCOUNTER — Ambulatory Visit (INDEPENDENT_AMBULATORY_CARE_PROVIDER_SITE_OTHER): Payer: PRIVATE HEALTH INSURANCE | Admitting: Family Medicine

## 2014-12-16 VITALS — BP 116/74 | HR 71 | Wt 228.0 lb

## 2014-12-16 DIAGNOSIS — T148 Other injury of unspecified body region: Secondary | ICD-10-CM

## 2014-12-16 DIAGNOSIS — W57XXXA Bitten or stung by nonvenomous insect and other nonvenomous arthropods, initial encounter: Secondary | ICD-10-CM | POA: Insufficient documentation

## 2014-12-16 MED ORDER — TRIAMCINOLONE ACETONIDE 0.5 % EX OINT: 1.0000 "application " | TOPICAL_OINTMENT | Freq: Two times a day (BID) | CUTANEOUS | Status: DC

## 2014-12-16 NOTE — Patient Instructions (Signed)
Thank you for coming in today.  Insect Bite Mosquitoes, flies, fleas, bedbugs, and many other insects can bite. Insect bites are different from insect stings. A sting is when venom is injected into the skin. Some insect bites can transmit infectious diseases. SYMPTOMS  Insect bites usually turn red, swell, and itch for 2 to 4 days. They often go away on their own. TREATMENT  Your caregiver may prescribe antibiotic medicines if a bacterial infection develops in the bite. HOME CARE INSTRUCTIONS  Do not scratch the bite area.  Keep the bite area clean and dry. Wash the bite area thoroughly with soap and water.  Put ice or cool compresses on the bite area.  Put ice in a plastic bag.  Place a towel between your skin and the bag.  Leave the ice on for 20 minutes, 4 times a day for the first 2 to 3 days, or as directed.  You may apply a baking soda paste, cortisone cream, or calamine lotion to the bite area as directed by your caregiver. This can help reduce itching and swelling.  Only take over-the-counter or prescription medicines as directed by your caregiver.  If you are given antibiotics, take them as directed. Finish them even if you start to feel better. You may need a tetanus shot if:  You cannot remember when you had your last tetanus shot.  You have never had a tetanus shot.  The injury broke your skin. If you get a tetanus shot, your arm may swell, get red, and feel warm to the touch. This is common and not a problem. If you need a tetanus shot and you choose not to have one, there is a rare chance of getting tetanus. Sickness from tetanus can be serious. SEEK IMMEDIATE MEDICAL CARE IF:   You have increased pain, redness, or swelling in the bite area.  You see a red line on the skin coming from the bite.  You have a fever.  You have joint pain.  You have a headache or neck pain.  You have unusual weakness.  You have a rash.  You have chest pain or shortness of  breath.  You have abdominal pain, nausea, or vomiting.  You feel unusually tired or sleepy. MAKE SURE YOU:   Understand these instructions.  Will watch your condition.  Will get help right away if you are not doing well or get worse. Document Released: 05/31/2004 Document Revised: 07/16/2011 Document Reviewed: 11/22/2010 [facility] Patient Information 2015 [place], [place]. This information is not intended to replace advice given to you by your health care provider. Make sure you discuss any questions you have with your health care provider.

## 2014-12-16 NOTE — Progress Notes (Signed)
[name] is a 49 y.o. male who presents to [facility]  today for rash. Patient has small papular rash on his back scattered. He notes he spent some nights in a hotel week ago. He has not tried any treatment. He notes the rash is itchy and not painful. No similar rash. No fevers chills nausea vomiting or diarrhea. No treatment tried yet. No new soaps detergents or shampoos cosmetics or medications.   Past Medical History  Diagnosis Date  . Hypotension, unspecified   . CAD (coronary artery disease) of artery bypass graft 2009    Status post bypass grafting 2009 , status post stress echo 2012 no ischemia.  . Hyperlipidemia, mixed   . Dermatitis     Left upper extremity  . Sleep apnea     On 8 cm of water CPAP  . Erectile dysfunction   . Myocardial infarction     per pt had 2 MI  . Hx of adenomatous colonic polyps 09/02/2014   Past Surgical History  Procedure Laterality Date  . Coronary artery bypass graft  2009   Social History  Substance Use Topics  . Smoking status: Former Smoker -- 0.20 packs/day for 5 years    Types: Cigarettes, Cigars    Start date: 05/28/2000    Quit date: 05/07/2005  . Smokeless tobacco: Former Systems developer    Types: [place] date: 05/07/1997     Comment: chewed tobacco x 20 yrs - quit 2000  . Alcohol Use: 9.0 oz/week    5 Standard drinks or equivalent, 10 Shots of liquor per week     Comment: beer & liquor   ROS as above Medications: Current Outpatient Prescriptions  Medication Sig Dispense Refill  . aspirin 81 MG tablet Take 81 mg by mouth daily.      . carbamide peroxide (DEBROX) 6.5 % otic solution Place 10 drops into both ears 2 (two) times daily. Dispense 1 bottle (Patient taking differently: Place 10 drops into both ears as needed. Dispense 1 bottle) 15 mL 11  . fluticasone (FLONASE) 50 MCG/ACT nasal spray One spray in each nostril twice a day, use left hand for right nostril, and right hand for left nostril.  48 g 3  . Multiple Vitamin (MULTIVITAMIN) tablet Take 1 tablet by mouth daily.    . Omega-3 Fatty Acids (FISH OIL) 1200 MG CAPS Take 1 capsule by mouth daily.    . rosuvastatin (CRESTOR) 40 MG tablet Take 1 tablet (40 mg total) by mouth daily. 90 tablet 3  . triamcinolone ointment (KENALOG) 0.5 % Apply 1 application topically 2 (two) times daily. To affected area, avoid eyes and face 30 g 3   No current facility-administered medications for this visit.   Allergies  Allergen Reactions  . Penicillins     Unsure of reaction  . Sulfonamide Derivatives     REACTION: rash,SOB,hives     Exam:  BP 116/74 mmHg  Pulse 71  Wt 228 lb (103.42 kg) Gen: Well NAD HEENT: EOMI,  MMM Lungs: Normal work of breathing. CTABL Heart: RRR no MRG Abd: NABS, Soft. Nondistended, Nontender Exts: Brisk capillary refill, warm and well perfused.  Skin: Erythematous papular rash on back scattered across the mid back. Nontender. No fluctuance or induration  No results found for this or any previous visit (from the past 24 hour(s)). No results found.   Please see individual assessment and plan sections.

## 2014-12-16 NOTE — Assessment & Plan Note (Signed)
Rash likely due to some sort of bug bite. Doubtful for scabies. Treatment with triamcinolone ointment. Return as needed. New problem

## 2014-12-28 ENCOUNTER — Other Ambulatory Visit: Payer: Self-pay | Admitting: Cardiology

## 2014-12-28 MED ORDER — ROSUVASTATIN CALCIUM 40 MG PO TABS: 40.0000 mg | ORAL_TABLET | Freq: Every day | ORAL | Status: DC

## 2014-12-28 NOTE — Telephone Encounter (Signed)
Needs refill on Crestor sent to [facility] / tg

## 2015-05-19 ENCOUNTER — Ambulatory Visit (INDEPENDENT_AMBULATORY_CARE_PROVIDER_SITE_OTHER): Payer: Commercial Managed Care - PPO | Admitting: Sports Medicine

## 2015-05-19 ENCOUNTER — Encounter: Payer: Self-pay | Admitting: Sports Medicine

## 2015-05-19 VITALS — BP 122/89 | HR 88 | Temp 97.7°F | Resp 16 | Ht 75.0 in | Wt 229.1 lb

## 2015-05-19 DIAGNOSIS — N402 Nodular prostate without lower urinary tract symptoms: Secondary | ICD-10-CM | POA: Diagnosis not present

## 2015-05-19 DIAGNOSIS — J309 Allergic rhinitis, unspecified: Secondary | ICD-10-CM | POA: Diagnosis not present

## 2015-05-19 DIAGNOSIS — Z1211 Encounter for screening for malignant neoplasm of colon: Secondary | ICD-10-CM

## 2015-05-19 DIAGNOSIS — Z0000 Encounter for general adult medical examination without abnormal findings: Secondary | ICD-10-CM

## 2015-05-19 LAB — POC HEMOCCULT BLD/STL (OFFICE/1-CARD/DIAGNOSTIC)
Card #1 Date: 11217
Fecal Occult Blood, POC: NEGATIVE

## 2015-05-19 MED ORDER — BECLOMETHASONE DIPROPIONATE 80 MCG/ACT NA AERS: 1.0000 | INHALATION_SPRAY | Freq: Every day | NASAL | Status: DC

## 2015-05-19 NOTE — Addendum Note (Signed)
Addended by: Elizabeth Sauer on: 05/19/2015 04:00 PM   Modules accepted: Orders

## 2015-05-19 NOTE — Progress Notes (Signed)
  Subjective:    CC: Complete physical  HPI:  This is a pleasant 50 year old male, he is here for a physical, has no complaints with the exception of some nasal congestion. Currently using Flonase with minimal efficacy.  Past medical history, Surgical history, Family history not pertinant except as noted below, Social history, Allergies, and medications have been entered into the medical record, reviewed, and no changes needed.   Review of Systems: No headache, visual changes, nausea, vomiting, diarrhea, constipation, dizziness, abdominal pain, skin rash, fevers, chills, night sweats, swollen lymph nodes, weight loss, chest pain, body aches, joint swelling, muscle aches, shortness of breath, mood changes, visual or auditory hallucinations.  Objective:    General: Well Developed, well nourished, and in no acute distress.  Neuro: Alert and oriented x3, extra-ocular muscles intact, sensation grossly intact. Cranial nerves II through XII are intact, motor, sensory, and coordinative functions are all intact. HEENT: Normocephalic, atraumatic, pupils equal round reactive to light, neck supple, no masses, no lymphadenopathy, thyroid nonpalpable. Oropharynx, nasopharynx, external ear canals are unremarkable. Skin: Warm and dry, no rashes noted.  Cardiac: Regular rate and rhythm, no murmurs rubs or gallops.  Respiratory: Clear to auscultation bilaterally. Not using accessory muscles, speaking in full sentences.  Abdominal: Soft, nontender, nondistended, positive bowel sounds, no masses, no organomegaly.  Musculoskeletal: Shoulder, elbow, wrist, hip, knee, ankle stable, and with full range of motion. Rectal: Good tone, prostate with a small lump on the left, nontender, subcentimeter. No visible hemorrhoids. Hemoccult negative.  Impression and Recommendations:    The patient was counselled, risk factors were discussed, anticipatory guidance given.

## 2015-05-19 NOTE — Assessment & Plan Note (Signed)
No obstructive symptoms and nontender, checking a PSA in a week.

## 2015-05-19 NOTE — Assessment & Plan Note (Signed)
Complete physical as above. 

## 2015-05-19 NOTE — Assessment & Plan Note (Signed)
Switch to QNasl

## 2015-06-07 LAB — PSA, TOTAL AND FREE
PSA, Free Pct: 46 % (ref >25)
PSA, Free: 0.18 ng/mL
PSA: 0.39 ng/mL (ref <=4.00)

## 2015-06-07 LAB — VITAMIN D 25 HYDROXY (VIT D DEFICIENCY, FRACTURES): Vit D, 25-Hydroxy: 16 ng/mL — ABNORMAL LOW (ref 30–100)

## 2015-06-07 MED ORDER — VITAMIN D (ERGOCALCIFEROL) 1.25 MG (50000 UNIT) PO CAPS: 50000.0000 [IU] | ORAL_CAPSULE | ORAL | Status: DC

## 2015-06-07 NOTE — Addendum Note (Signed)
Addended by: Silverio Decamp on: 06/07/2015 08:50 AM   Modules accepted: Orders

## 2015-07-07 ENCOUNTER — Ambulatory Visit (INDEPENDENT_AMBULATORY_CARE_PROVIDER_SITE_OTHER): Payer: Commercial Managed Care - PPO

## 2015-07-07 ENCOUNTER — Other Ambulatory Visit: Payer: Self-pay | Admitting: Sports Medicine

## 2015-07-07 ENCOUNTER — Encounter: Payer: Self-pay | Admitting: Sports Medicine

## 2015-07-07 ENCOUNTER — Ambulatory Visit (INDEPENDENT_AMBULATORY_CARE_PROVIDER_SITE_OTHER): Payer: Commercial Managed Care - PPO | Admitting: Sports Medicine

## 2015-07-07 VITALS — BP 106/66 | HR 88 | Temp 98.5°F | Ht 75.0 in | Wt 230.0 lb

## 2015-07-07 DIAGNOSIS — R05 Cough: Secondary | ICD-10-CM

## 2015-07-07 DIAGNOSIS — R52 Pain, unspecified: Secondary | ICD-10-CM | POA: Diagnosis not present

## 2015-07-07 DIAGNOSIS — R053 Chronic cough: Secondary | ICD-10-CM | POA: Insufficient documentation

## 2015-07-07 DIAGNOSIS — Z20828 Contact with and (suspected) exposure to other viral communicable diseases: Secondary | ICD-10-CM

## 2015-07-07 DIAGNOSIS — R059 Cough, unspecified: Secondary | ICD-10-CM

## 2015-07-07 LAB — POCT INFLUENZA A/B
Influenza A, POC: NEGATIVE
Influenza B, POC: NEGATIVE

## 2015-07-07 MED ORDER — OSELTAMIVIR PHOSPHATE 75 MG PO CAPS: 75.0000 mg | ORAL_CAPSULE | Freq: Two times a day (BID) | ORAL | Status: DC

## 2015-07-07 MED ORDER — BENZONATATE 200 MG PO CAPS: 200.0000 mg | ORAL_CAPSULE | Freq: Three times a day (TID) | ORAL | Status: DC | PRN

## 2015-07-07 NOTE — Assessment & Plan Note (Signed)
Negative influenza, needs a chest x-ray, out of work and [employer] Return to see if no better in 2 weeks.

## 2015-07-07 NOTE — Progress Notes (Signed)
  Subjective:    CC: Coughing  HPI: For the past 2 days this pleasant 50 year old male has had fevers, chills, muscle aches, body aches, nonproductive cough, wife was hospitalized with influenza. Symptoms are moderate, persistent, no vomiting, nausea, diarrhea.  Past medical history, Surgical history, Family history not pertinant except as noted below, Social history, Allergies, and medications have been entered into the medical record, reviewed, and no changes needed.   Review of Systems: No fevers, chills, night sweats, weight loss, chest pain, or shortness of breath.   Objective:    General: Well Developed, well nourished, and in no acute distress.  Neuro: Alert and oriented x3, extra-ocular muscles intact, sensation grossly intact.  HEENT: Normocephalic, atraumatic, pupils equal round reactive to light, neck supple, no masses, no lymphadenopathy, thyroid nonpalpable. Oropharynx, nasopharynx come ear canals unremarkable. Skin: Warm and dry, no rashes. Cardiac: Regular rate and rhythm, no murmurs rubs or gallops, no lower extremity edema.  Respiratory: Clear to auscultation bilaterally. Not using accessory muscles, speaking in full sentences.  Rapid flu test is negative.  Impression and Recommendations:

## 2015-07-08 ENCOUNTER — Telehealth: Payer: Self-pay | Admitting: Sports Medicine

## 2015-07-08 MED ORDER — MELOXICAM 15 MG PO TABS: ORAL_TABLET | ORAL | Status: DC

## 2015-07-08 NOTE — Telephone Encounter (Signed)
Adding meloxicam. This is a prescription strength NSAID, if no improvement by the beginning of next week we should obtain CK levels.

## 2015-07-08 NOTE — Telephone Encounter (Signed)
Pt advised of new Rx. Advised to contact office on Monday if he is not feeling any better and an order can be placed for a new chest xray. Verbalized understanding.

## 2015-07-08 NOTE — Telephone Encounter (Signed)
Pt called to state he has been taking the tamiflu, cough pearls, and ibuprofen. Pt reports he feels worse and his body aches are worse. Questioning if there is another Rx or OTC Rx he can try. Will route.

## 2015-07-22 ENCOUNTER — Ambulatory Visit (INDEPENDENT_AMBULATORY_CARE_PROVIDER_SITE_OTHER): Payer: Commercial Managed Care - PPO | Admitting: Sports Medicine

## 2015-07-22 VITALS — BP 112/74 | HR 77 | Resp 18 | Wt 232.0 lb

## 2015-07-22 DIAGNOSIS — J0100 Acute maxillary sinusitis, unspecified: Secondary | ICD-10-CM

## 2015-07-22 DIAGNOSIS — J0110 Acute frontal sinusitis, unspecified: Secondary | ICD-10-CM | POA: Insufficient documentation

## 2015-07-22 MED ORDER — PREDNISONE 50 MG PO TABS: 50.0000 mg | ORAL_TABLET | Freq: Every day | ORAL | Status: DC

## 2015-07-22 MED ORDER — AZITHROMYCIN 250 MG PO TABS: ORAL_TABLET | ORAL | Status: DC

## 2015-07-22 NOTE — Progress Notes (Signed)
  Subjective:    CC: sinus infection  HPI: I recently saw this 50 year old male for influenza-like illness, we treated him conservatively, he returns today with increasing sinus pain and pressure. Sore throat. Symptoms are moderate, persistent with radiation to the left ear.  Past medical history, Surgical history, Family history not pertinant except as noted below, Social history, Allergies, and medications have been entered into the medical record, reviewed, and no changes needed.   Review of Systems: No fevers, chills, night sweats, weight loss, chest pain, or shortness of breath.   Objective:    General: Well Developed, well nourished, and in no acute distress.  Neuro: Alert and oriented x3, extra-ocular muscles intact, sensation grossly intact.  HEENT: Normocephalic, atraumatic, pupils equal round reactive to light, neck supple, no masses, no lymphadenopathy, thyroid nonpalpable. Oropharynx, nasopharynx unremarkable, left tympanic membrane is erythematous. Skin: Warm and dry, no rashes. Cardiac: Regular rate and rhythm, no murmurs rubs or gallops, no lower extremity edema.  Respiratory: Clear to auscultation bilaterally. Not using accessory muscles, speaking in full sentences.  Impression and Recommendations:    I spent 25 minutes with this patient, greater than 50% was face-to-face time counseling regarding the above diagnoses

## 2015-07-22 NOTE — Assessment & Plan Note (Signed)
Persistent symptoms, with a left otitis media. Azithromycin, prednisone for 5 days, return to see me as needed.

## 2015-09-08 ENCOUNTER — Encounter: Payer: Self-pay | Admitting: Cardiology

## 2015-09-08 ENCOUNTER — Ambulatory Visit (INDEPENDENT_AMBULATORY_CARE_PROVIDER_SITE_OTHER): Payer: No Typology Code available for payment source | Admitting: Cardiology

## 2015-09-08 VITALS — BP 117/75 | HR 69 | Ht 75.0 in | Wt 227.8 lb

## 2015-09-08 DIAGNOSIS — I10 Essential (primary) hypertension: Secondary | ICD-10-CM

## 2015-09-08 NOTE — Patient Instructions (Signed)

## 2015-09-08 NOTE — Progress Notes (Signed)
Patient ID: [name], male   DOB: [birth year], 50 y.o.   MRN: [id number]     Clinical Summary [name] is a 50 y.o.male seen today for follow up of the following medical problems.   1. CAD - CABG in 2009 4 vessel (LIMA-LAD, left radial to OM, SVG-diag, SVG-RCA) - 2012 DSE with baseline LVEF 55-60%, no ischemia - no chest pain. No SOB or DOE.  -meds limited due to low bp's, has not been on beta blocker or ACE-I  2. Hyperlipidemia - compliant with crestor - 09/2014 TC 119 TG 135 HDL 31 LDL 61  3. OSA - on CPAP    SH: works for [name] Past Medical History  Diagnosis Date  . Hypotension, unspecified   . CAD (coronary artery disease) of artery bypass graft 2009    Status post bypass grafting 2009 , status post stress echo 2012 no ischemia.  . Hyperlipidemia, mixed   . Dermatitis     Left upper extremity  . Sleep apnea     On 8 cm of water CPAP  . Erectile dysfunction   . Myocardial infarction ([place])     per pt had 2 MI  . Hx of adenomatous colonic polyps 09/02/2014     Allergies  Allergen Reactions  . Penicillins     Unsure of reaction  . Sulfonamide Derivatives     REACTION: rash,SOB,hives     Current Outpatient Prescriptions  Medication Sig Dispense Refill  . aspirin 81 MG tablet Take 81 mg by mouth daily.      [name] azithromycin (ZITHROMAX Z-PAK) 250 MG tablet Take 2 tablets (500 mg) on  Day 1,  followed by 1 tablet (250 mg) once daily on Days 2 through 5. 6 tablet 0  . Beclomethasone Dipropionate (QNASL) 80 MCG/ACT AERS Place 1 spray into both nostrils daily. 1 Inhaler 11  . meloxicam (MOBIC) 15 MG tablet One tab PO qAM with breakfast for 2 weeks, then daily prn pain. 30 tablet 3  . Multiple Vitamin (MULTIVITAMIN) tablet Take 1 tablet by mouth daily.    . Omega-3 Fatty Acids (FISH OIL) 1200 MG CAPS Take 1 capsule by mouth daily.    . predniSONE (DELTASONE) 50 MG tablet Take 1 tablet (50 mg total) by mouth daily. 5 tablet 0  . rosuvastatin (CRESTOR)  40 MG tablet Take 1 tablet (40 mg total) by mouth daily. 90 tablet 3  . Vitamin D, Ergocalciferol, (DRISDOL) 50000 units CAPS capsule Take 1 capsule (50,000 Units total) by mouth every 7 (seven) days. Take for 8 total doses(weeks) 8 capsule 0   No current facility-administered medications for this visit.     Past Surgical History  Procedure Laterality Date  . Coronary artery bypass graft  2009     Allergies  Allergen Reactions  . Penicillins     Unsure of reaction  . Sulfonamide Derivatives     REACTION: rash,SOB,hives      Family History  Problem Relation Age of Onset  . Kidney disease Other   . Coronary artery disease Other   . Heart failure Other   . Heart disease Mother   . Heart disease Father   . Colon cancer Paternal Uncle   . Esophageal cancer Neg Hx   . Prostate cancer Neg Hx   . Rectal cancer Neg Hx   . Stomach cancer Neg Hx      Social History [name] reports that he quit smoking about 10 years ago. His smoking use included  Cigarettes and Cigars. He started smoking about 15 years ago. He has a 1 pack-year smoking history. He quit smokeless tobacco use about 18 years ago. His smokeless tobacco use included Chew. [name] reports that he drinks about 9.0 oz of alcohol per week.   Review of Systems CONSTITUTIONAL: No weight loss, fever, chills, weakness or fatigue.  HEENT: Eyes: No visual loss, blurred vision, double vision or yellow sclerae.No hearing loss, sneezing, congestion, runny nose or sore throat.  SKIN: No rash or itching.  CARDIOVASCULAR: per HPI RESPIRATORY: No shortness of breath, cough or sputum.  GASTROINTESTINAL: No anorexia, nausea, vomiting or diarrhea. No abdominal pain or blood.  GENITOURINARY: No burning on urination, no polyuria NEUROLOGICAL: No headache, dizziness, syncope, paralysis, ataxia, numbness or tingling in the extremities. No change in bowel or bladder control.  MUSCULOSKELETAL: No muscle, back pain, joint pain or  stiffness.  LYMPHATICS: No enlarged nodes. No history of splenectomy.  PSYCHIATRIC: No history of depression or anxiety.  ENDOCRINOLOGIC: No reports of sweating, cold or heat intolerance. No polyuria or polydipsia.  [name]   Physical Examination Filed Vitals:   09/08/15 1303  BP: 117/75  Pulse: 69   Filed Vitals:   09/08/15 1303  Height: 6\' 3"  (1.905 m)  Weight: 227 lb 12.8 oz (103.329 kg)    Gen: resting comfortably, no acute distress HEENT: no scleral icterus, pupils equal round and reactive, no palptable cervical adenopathy,  CV: RRR, no m/r/g, no jvd Resp: Clear to auscultation bilaterally GI: abdomen is soft, non-tender, non-distended, normal bowel sounds, no hepatosplenomegaly MSK: extremities are warm, no edema.  Skin: warm, no rash Neuro:  no focal deficits Psych: appropriate affect   Diagnostic Studies 12/2007 ANGIOGRAPHIC DATA:  1. The left main is free of critical disease.  2. The LAD courses to the apex. The LAD has multiple lesions  including a 70% proximal mid and 80% lesion at the takeoff of the  third diagonal. There were then in the distal vessel multiple  lesions including a 90, 70, 50, 90, and 90 in sequence. There is a  70% apical stenosis as well. The first diagonal [name] is small-to-  moderate in size with 90% ostial narrowing. Second diagonal is  fairly large in size with 80% proximal and 80% mid narrowing.  Second diagonal does appear to be graftable. The third diagonal  has 90% ostial narrowing.  3. The circumflex has a tiny second marginal with 90% narrowing that  was totally occluded and it started filling late by collaterals.  4. The right coronary artery is totally occluded proximally and fills  in by left-to-right collaterals.  5. Ventriculography in the RAO projection reveals estimated ejection  fraction of 45-50%. There is inferior wall motion abnormality in  the mid inferior wall corresponding the circumflex territory and   anteroapical abnormality, that is mild.  CONCLUSIONS:  1. Total occlusion of the circumflex and right coronary arteries.  2. Minimum 8 successive lesions in the left anterior descending  artery, and including high-grade disease of the large second  diagonal Tami Blass as noted above.  3. Progressive symptoms compatible with angina pectoris.  RECOMMENDATIONS: The LAD is not ideal for grafting. Nonetheless, the  apical vessel possibly could be grafted as well as the large diagonal.  In addition, the marginal and the RCA also appear to be optimal for  grafting. Surgical consultation will be obtained.    Assessment and Plan  1. CAD - no current symptoms - EKG in clinic shows NSR and no  acute ischemic changes -we will  continue secondary prevention   2. Hyperlipidemia - continue crestor, lipids have been at goal  3. OSA - continue CPAP   F/u 1 year      Arnoldo Lenis, M.D.

## 2016-03-23 ENCOUNTER — Other Ambulatory Visit: Payer: Self-pay | Admitting: Cardiology

## 2016-04-23 ENCOUNTER — Encounter: Payer: Self-pay | Admitting: Family Medicine

## 2016-04-23 ENCOUNTER — Ambulatory Visit (INDEPENDENT_AMBULATORY_CARE_PROVIDER_SITE_OTHER): Payer: No Typology Code available for payment source | Admitting: Family Medicine

## 2016-04-23 VITALS — BP 128/71 | HR 91 | Temp 97.6°F | Wt 231.0 lb

## 2016-04-23 DIAGNOSIS — J069 Acute upper respiratory infection, unspecified: Secondary | ICD-10-CM

## 2016-04-23 MED ORDER — IPRATROPIUM BROMIDE 0.06 % NA SOLN: 2.0000 | NASAL | 6 refills | Status: DC | PRN

## 2016-04-23 MED ORDER — PREDNISONE 10 MG PO TABS: 30.0000 mg | ORAL_TABLET | Freq: Every day | ORAL | 0 refills | Status: DC

## 2016-04-23 MED ORDER — AZITHROMYCIN 250 MG PO TABS: 250.0000 mg | ORAL_TABLET | Freq: Every day | ORAL | 0 refills | Status: DC

## 2016-04-23 NOTE — Patient Instructions (Signed)
Thank you for coming in today. Use the nasal spray.  Take over the counter medicines.  Use prednisone and azithromycin if not better.  Call or go to the emergency room if you get worse, have trouble breathing, have chest pains, or palpitations.    Upper Respiratory Infection, Adult Most upper respiratory infections (URIs) are caused by a virus. A URI affects the nose, throat, and upper air passages. The most common type of URI is often called "the common cold." Follow these instructions at home:  Take medicines only as told by your doctor.  Gargle warm saltwater or take cough drops to comfort your throat as told by your doctor.  Use a warm mist humidifier or inhale steam from a shower to increase air moisture. This may make it easier to breathe.  Drink enough fluid to keep your pee (urine) clear or pale yellow.  Eat soups and other clear broths.  Have a healthy diet.  Rest as needed.  Go back to work when your fever is gone or your doctor says it is okay.  You may need to stay home longer to avoid giving your URI to others.  You can also wear a face mask and wash your hands often to prevent spread of the virus.  Use your inhaler more if you have asthma.  Do not use any tobacco products, including cigarettes, chewing tobacco, or electronic cigarettes. If you need help quitting, ask your doctor. Contact a doctor if:  You are getting worse, not better.  Your symptoms are not helped by medicine.  You have chills.  You are getting more short of breath.  You have brown or red mucus.  You have yellow or brown discharge from your nose.  You have pain in your face, especially when you bend forward.  You have a fever.  You have puffy (swollen) neck glands.  You have pain while swallowing.  You have white areas in the back of your throat. Get help right away if:  You have very bad or constant:  Headache.  Ear pain.  Pain in your forehead, behind your eyes, and  over your cheekbones (sinus pain).  Chest pain.  You have long-lasting (chronic) lung disease and any of the following:  Wheezing.  Long-lasting cough.  Coughing up blood.  A change in your usual mucus.  You have a stiff neck.  You have changes in your:  Vision.  Hearing.  Thinking.  Mood. This information is not intended to replace advice given to you by your health care provider. Make sure you discuss any questions you have with your health care provider. Document Released: 10/10/2007 Document Revised: 12/25/2015 Document Reviewed: 07/29/2013 Elsevier Interactive Patient Education  2017 [employer].

## 2016-04-23 NOTE — Progress Notes (Signed)
[name] is a 50 y.o. male who presents to [facility] today for cough congestion and runny nose ear pain. Symptoms present for one day. Patient has tried some over-the-counter medicines which have helped a bit. Patient denies any change in hearing vomiting diarrhea or shortness of breath. He feels well otherwise.   Past Medical History:  Diagnosis Date  . CAD (coronary artery disease) of artery bypass graft 2009   Status post bypass grafting 2009 , status post stress echo 2012 no ischemia.  . Dermatitis    Left upper extremity  . Erectile dysfunction   . Hx of adenomatous colonic polyps 09/02/2014  . Hyperlipidemia, mixed   . Hypotension, unspecified   . Myocardial infarction    per pt had 2 MI  . Sleep apnea    On 8 cm of water CPAP   Past Surgical History:  Procedure Laterality Date  . CORONARY ARTERY BYPASS GRAFT  2009   Social History  Substance Use Topics  . Smoking status: Former Smoker    Packs/day: 0.20    Years: 5.00    Types: Cigarettes, Cigars    Start date: 05/28/2000    Quit date: 05/07/2005  . Smokeless tobacco: Former Systems developer    Types: [place] date: 05/07/1997     Comment: chewed tobacco x 20 yrs - quit 2000  . Alcohol use 9.0 oz/week    5 Standard drinks or equivalent, 10 Shots of liquor per week     Comment: beer & liquor   family history includes Colon cancer in his paternal uncle; Coronary artery disease in his other; Heart disease in his father and mother; Heart failure in his other; Kidney disease in his other.  ROS as above:  Medications: Current Outpatient Prescriptions  Medication Sig Dispense Refill  . aspirin 81 MG tablet Take 81 mg by mouth daily.      . meloxicam (MOBIC) 15 MG tablet One tab PO qAM with breakfast for 2 weeks, then daily prn pain. 30 tablet 3  . Multiple Vitamin (MULTIVITAMIN) tablet Take 1 tablet by  mouth daily.    . Omega-3 Fatty Acids (FISH OIL) 1200 MG CAPS Take 1 capsule by mouth daily.    . rosuvastatin (CRESTOR) 40 MG tablet TAKE 1 TABLET DAILY 90 tablet 3  . Vitamin D, Ergocalciferol, (DRISDOL) 50000 units CAPS capsule Take 1 capsule (50,000 Units total) by mouth every 7 (seven) days. Take for 8 total doses(weeks) 8 capsule 0  . azithromycin (ZITHROMAX) 250 MG tablet Take 1 tablet (250 mg total) by mouth daily. Take first 2 tablets together, then 1 every day until finished. 6 tablet 0  . ipratropium (ATROVENT) 0.06 % nasal spray Place 2 sprays into both nostrils every 4 (four) hours as needed for rhinitis. 10 mL 6  . predniSONE (DELTASONE) 10 MG tablet Take 3 tablets (30 mg total) by mouth daily with breakfast. 15 tablet 0   No current facility-administered medications for this visit.    Allergies  Allergen Reactions  . Penicillins     Unsure of reaction  . Sulfonamide Derivatives     REACTION: rash,SOB,hives    Health Maintenance Health Maintenance  Topic Date Due  . HIV Screening  05/29/1979  . INFLUENZA VACCINE  05/18/2016 (Originally 12/06/2015)  . COLONOSCOPY  09/01/2017  . TETANUS/TDAP  01/03/2018     Exam:  BP 128/71   Pulse 91   Temp 97.6  F (36.4 C) (Oral)   Wt 231 lb (104.8 kg)   SpO2 99%   BMI 28.87 kg/m  Gen: Well NAD HEENT: EOMI,  MMM Tympanic membranes are retracted bilaterally without erythema. Clear nasal discharge is present. Posterior pharynx with mild cobblestoning. Lungs: Normal work of breathing. CTABL Heart: RRR no MRG Abd: NABS, Soft. Nondistended, Nontender Exts: Brisk capillary refill, warm and well perfused.    No results found for this or any previous visit (from the past 72 hour(s)). No results found.    Assessment and Plan: 50 y.o. male with viral URI with eustachian tube dysfunction. Treat empirically with over-the-counter medications and Atrovent nasal spray. Use prednisone and azithromycin if worsening patient displays  second sickening symptoms.   No orders of the defined types were placed in this encounter.   Discussed warning signs or symptoms. Please see discharge instructions. Patient expresses understanding.

## 2016-08-14 ENCOUNTER — Encounter: Payer: Self-pay | Admitting: Sports Medicine

## 2016-08-14 ENCOUNTER — Ambulatory Visit (INDEPENDENT_AMBULATORY_CARE_PROVIDER_SITE_OTHER): Payer: No Typology Code available for payment source | Admitting: Sports Medicine

## 2016-08-14 DIAGNOSIS — H6123 Impacted cerumen, bilateral: Secondary | ICD-10-CM | POA: Diagnosis not present

## 2016-08-14 DIAGNOSIS — J0110 Acute frontal sinusitis, unspecified: Secondary | ICD-10-CM | POA: Diagnosis not present

## 2016-08-14 MED ORDER — AZITHROMYCIN 250 MG PO TABS: ORAL_TABLET | ORAL | 0 refills | Status: DC

## 2016-08-14 MED ORDER — LEVOCETIRIZINE DIHYDROCHLORIDE 5 MG PO TABS: 5.0000 mg | ORAL_TABLET | Freq: Every evening | ORAL | 3 refills | Status: DC

## 2016-08-14 MED ORDER — MONTELUKAST SODIUM 10 MG PO TABS: 10.0000 mg | ORAL_TABLET | Freq: Every day | ORAL | 3 refills | Status: DC

## 2016-08-14 NOTE — Progress Notes (Signed)
  Subjective:    CC: Sinus pressure  HPI: For the past couple of weeks this pleasant 51 year old male has noted increasing nasal stuffiness, sinus pressure. Symptoms are moderate, persistent, localized over the frontal sinuses, he's tried ipratropium nasal spray without much improvement. No cough, no shortness of breath. No fevers or chills.  Past medical history:  Negative.  See flowsheet/record as well for more information.  Surgical history: Negative.  See flowsheet/record as well for more information.  Family history: Negative.  See flowsheet/record as well for more information.  Social history: Negative.  See flowsheet/record as well for more information.  Allergies, and medications have been entered into the medical record, reviewed, and no changes needed.   Review of Systems: No fevers, chills, night sweats, weight loss, chest pain, or shortness of breath.   Objective:    General: Well Developed, well nourished, and in no acute distress.  Neuro: Alert and oriented x3, extra-ocular muscles intact, sensation grossly intact.  HEENT: Normocephalic, atraumatic, pupils equal round reactive to light, neck supple, no masses, no lymphadenopathy, thyroid nonpalpable. Tender to palpation over the frontal sinuses, oropharynx, nasopharynx unremarkable with the exception of some erythema and boggy turbinates, bilateral cerumen impactions. Skin: Warm and dry, no rashes. Cardiac: Regular rate and rhythm, no murmurs rubs or gallops, no lower extremity edema.  Respiratory: Clear to auscultation bilaterally. Not using accessory muscles, speaking in full sentences.  Indication: Cerumen impaction of the left and right ear(s) Medical necessity statement: On physical examination, cerumen impairs clinically significant portions of the external auditory canal, and tympanic membrane. Noted obstructive, copious cerumen that cannot be removed without irrigation Consent: Discussed benefits and risks of procedure  and verbal consent obtained Procedure: Patient was prepped for the procedure. Utilized an otoscope to assess and take note of the ear canal, the tympanic membrane, and the presence, amount, and placement of the cerumen. Gentle water irrigation was utilized to remove cerumen.  Post procedure examination: shows cerumen was completely removed. Patient tolerated procedure well. The patient is made aware that they may experience temporary vertigo, temporary hearing loss, and temporary discomfort. If these symptom last for more than 24 hours to call the clinic or proceed to the ED.  Impression and Recommendations:    Acute frontal sinusitis Adding Xyzal, Singulair, azithromycin. Return to see me if no better in one month.

## 2016-08-14 NOTE — Assessment & Plan Note (Signed)
Adding Xyzal, Singulair, azithromycin. Return to see me if no better in one month.

## 2016-09-07 ENCOUNTER — Encounter: Payer: Self-pay | Admitting: *Deleted

## 2016-09-10 ENCOUNTER — Ambulatory Visit (INDEPENDENT_AMBULATORY_CARE_PROVIDER_SITE_OTHER): Payer: No Typology Code available for payment source | Admitting: Cardiology

## 2016-09-10 ENCOUNTER — Encounter: Payer: Self-pay | Admitting: Cardiology

## 2016-09-10 VITALS — BP 112/73 | HR 74 | Ht 75.0 in | Wt 229.4 lb

## 2016-09-10 DIAGNOSIS — R7309 Other abnormal glucose: Secondary | ICD-10-CM | POA: Diagnosis not present

## 2016-09-10 DIAGNOSIS — I10 Essential (primary) hypertension: Secondary | ICD-10-CM | POA: Diagnosis not present

## 2016-09-10 DIAGNOSIS — E785 Hyperlipidemia, unspecified: Secondary | ICD-10-CM

## 2016-09-10 DIAGNOSIS — Z125 Encounter for screening for malignant neoplasm of prostate: Secondary | ICD-10-CM

## 2016-09-10 DIAGNOSIS — I2510 Atherosclerotic heart disease of native coronary artery without angina pectoris: Secondary | ICD-10-CM | POA: Diagnosis not present

## 2016-09-10 NOTE — Patient Instructions (Signed)
Your physician wants you to follow-up in: [place] will receive a reminder letter in the mail two months in advance. If you don't receive a letter, please call our office to schedule the follow-up appointment.  Your physician recommends that you continue on your current medications as directed. Please refer to the Current Medication list given to you today.  Your physician recommends that you return for lab work BMP/MG/PSA/CBC/TSH/HGBA1C/VITAMIN LIPIDS  Thank you for choosing [facility]!!

## 2016-09-10 NOTE — Progress Notes (Signed)
Clinical Summary [name] is a 51 y.o.male seen today for follow up of the following medical problems.   1. CAD - CABG in 2009 4 vessel (LIMA-LAD, left radial to OM, SVG-diag, SVG-RCA) - 2012 DSE with baseline LVEF 55-60%, no ischemia - no chest pain. No SOB or DOE.  -meds limited due to low bp's, has not been on beta blocker or ACE-I  - denies any chest pain. Some SOB related to allergies, better with antihistamine - compliant with meds  2. Hyperlipidemia - compliant with crestor - 09/2014 TC 119 TG 135 HDL 31 LDL 61   3. OSA - on CPAP    SH: works for [name] Daughter getting ready graduate high school, going to [name].    Past Medical History:  Diagnosis Date  . CAD (coronary artery disease) of artery bypass graft 2009   Status post bypass grafting 2009 , status post stress echo 2012 no ischemia.  . Dermatitis    Left upper extremity  . Erectile dysfunction   . Hx of adenomatous colonic polyps 09/02/2014  . Hyperlipidemia, mixed   . Hypotension, unspecified   . Myocardial infarction ([name])    per pt had 2 MI  . Sleep apnea    On 8 cm of water CPAP     Allergies  Allergen Reactions  . Penicillins     Unsure of reaction  . Sulfonamide Derivatives     REACTION: rash,SOB,hives     Current Outpatient Prescriptions  Medication Sig Dispense Refill  . aspirin 81 MG tablet Take 81 mg by mouth daily.      [name] azithromycin (ZITHROMAX Z-PAK) 250 MG tablet Take 2 tablets (500 mg) on  Day 1,  followed by 1 tablet (250 mg) once daily on Days 2 through 5. 6 tablet 0  . ipratropium (ATROVENT) 0.06 % nasal spray Place 2 sprays into both nostrils every 4 (four) hours as needed for rhinitis. 10 mL 6  . levocetirizine (XYZAL) 5 MG tablet Take 1 tablet (5 mg total) by mouth every evening. 30 tablet 3  . montelukast (SINGULAIR) 10 MG tablet Take 1 tablet (10 mg total) by mouth at bedtime. 30 tablet 3  . Multiple Vitamin (MULTIVITAMIN) tablet Take 1 tablet by  mouth daily.    . Omega-3 Fatty Acids (FISH OIL) 1200 MG CAPS Take 1 capsule by mouth daily.    . rosuvastatin (CRESTOR) 40 MG tablet TAKE 1 TABLET DAILY 90 tablet 3   No current facility-administered medications for this visit.      Past Surgical History:  Procedure Laterality Date  . CORONARY ARTERY BYPASS GRAFT  2009     Allergies  Allergen Reactions  . Penicillins     Unsure of reaction  . Sulfonamide Derivatives     REACTION: rash,SOB,hives      Family History  Problem Relation Age of Onset  . Heart disease Mother   . Heart disease Father   . Colon cancer Paternal Uncle   . Kidney disease Other   . Coronary artery disease Other   . Heart failure Other   . Esophageal cancer Neg Hx   . Prostate cancer Neg Hx   . Rectal cancer Neg Hx   . Stomach cancer Neg Hx      Social History [name] reports that he quit smoking about 11 years ago. His smoking use included Cigarettes and Cigars. He started smoking about 16 years ago. He has a 1.00 pack-year smoking history. He quit  smokeless tobacco use about 19 years ago. His smokeless tobacco use included Chew. [name] reports that he drinks about 9.0 oz of alcohol per week .   Review of Systems CONSTITUTIONAL: No weight loss, fever, chills, weakness or fatigue.  HEENT: Eyes: No visual loss, blurred vision, double vision or yellow sclerae.No hearing loss, sneezing, congestion, runny nose or sore throat.  SKIN: No rash or itching.  CARDIOVASCULAR: per hpi RESPIRATORY: No shortness of breath, cough or sputum.  GASTROINTESTINAL: No anorexia, nausea, vomiting or diarrhea. No abdominal pain or blood.  GENITOURINARY: No burning on urination, no polyuria NEUROLOGICAL: No headache, dizziness, syncope, paralysis, ataxia, numbness or tingling in the extremities. No change in bowel or bladder control.  MUSCULOSKELETAL: No muscle, back pain, joint pain or stiffness.  LYMPHATICS: No enlarged nodes. No history of splenectomy.    PSYCHIATRIC: No history of depression or anxiety.  ENDOCRINOLOGIC: No reports of sweating, cold or heat intolerance. No polyuria or polydipsia.  [name]   Physical Examination Vitals:   09/10/16 1355  BP: 112/73  Pulse: 74   Vitals:   09/10/16 1355  Weight: 229 lb 6.4 oz (104.1 kg)  Height: 6\' 3"  (1.905 m)    Gen: resting comfortably, no acute distress HEENT: no scleral icterus, pupils equal round and reactive, no palptable cervical adenopathy,  CV: RRR, no m/r/g, no jvd Resp: Clear to auscultation bilaterally GI: abdomen is soft, non-tender, non-distended, normal bowel sounds, no hepatosplenomegaly MSK: extremities are warm, no edema.  Skin: warm, no rash Neuro:  no focal deficits Psych: appropriate affect   Diagnostic Studies 12/2007 ANGIOGRAPHIC DATA:  1. The left main is free of critical disease.  2. The LAD courses to the apex. The LAD has multiple lesions  including a 70% proximal mid and 80% lesion at the takeoff of the  third diagonal. There were then in the distal vessel multiple  lesions including a 90, 70, 50, 90, and 90 in sequence. There is a  70% apical stenosis as well. The first diagonal [name] is small-to-  moderate in size with 90% ostial narrowing. Second diagonal is  fairly large in size with 80% proximal and 80% mid narrowing.  Second diagonal does appear to be graftable. The third diagonal  has 90% ostial narrowing.  3. The circumflex has a tiny second marginal with 90% narrowing that  was totally occluded and it started filling late by collaterals.  4. The right coronary artery is totally occluded proximally and fills  in by left-to-right collaterals.  5. Ventriculography in the RAO projection reveals estimated ejection  fraction of 45-50%. There is inferior wall motion abnormality in  the mid inferior wall corresponding the circumflex territory and  anteroapical abnormality, that is mild.  CONCLUSIONS:  1. Total occlusion of  the circumflex and right coronary arteries.  2. Minimum 8 successive lesions in the left anterior descending  artery, and including high-grade disease of the large second  diagonal [name] as noted above.  3. Progressive symptoms compatible with angina pectoris.  RECOMMENDATIONS: The LAD is not ideal for grafting. Nonetheless, the  apical vessel possibly could be grafted as well as the large diagonal.  In addition, the marginal and the RCA also appear to be optimal for  grafting. Surgical consultation will be obtained.      Assessment and Plan  1. CAD - no current symptoms - EKG today shows no acute ischemic changes - continue secondary current meds  2. Hyperlipidemia - continue crestor, repeat lipid panel  3. OSA -  continue CPAP   F/u 1 year. Obtain annual labs, including PSA       Arnoldo Lenis, M.D

## 2016-09-12 ENCOUNTER — Other Ambulatory Visit: Payer: Self-pay

## 2016-09-12 DIAGNOSIS — J0110 Acute frontal sinusitis, unspecified: Secondary | ICD-10-CM

## 2016-09-12 MED ORDER — MONTELUKAST SODIUM 10 MG PO TABS: 10.0000 mg | ORAL_TABLET | Freq: Every day | ORAL | 3 refills | Status: DC

## 2016-09-20 ENCOUNTER — Other Ambulatory Visit: Payer: Self-pay | Admitting: Cardiology

## 2016-09-20 LAB — BASIC METABOLIC PANEL
BUN: 14 mg/dL (ref 7–25)
CO2: 23 mmol/L (ref 20–31)
Calcium: 9.3 mg/dL (ref 8.6–10.3)
Chloride: 107 mmol/L (ref 98–110)
Creat: 0.9 mg/dL (ref 0.70–1.33)
GLUCOSE: 109 mg/dL — AB (ref 65–99)
POTASSIUM: 4.4 mmol/L (ref 3.5–5.3)
Sodium: 140 mmol/L (ref 135–146)

## 2016-09-20 LAB — CBC
HCT: 46.2 % (ref 38.5–50.0)
HEMOGLOBIN: 15.9 g/dL (ref 13.2–17.1)
MCH: 29.9 pg (ref 27.0–33.0)
MCHC: 34.4 g/dL (ref 32.0–36.0)
MCV: 86.8 fL (ref 80.0–100.0)
MPV: 9.4 fL (ref 7.5–12.5)
Platelets: 280 10*3/uL (ref 140–400)
RBC: 5.32 MIL/uL (ref 4.20–5.80)
RDW: 13.3 % (ref 11.0–15.0)
WBC: 9.6 10*3/uL (ref 3.8–10.8)

## 2016-09-20 LAB — LIPID PANEL
CHOL/HDL RATIO: 3.8 ratio (ref <5.0)
CHOLESTEROL: 122 mg/dL (ref <200)
HDL: 32 mg/dL — ABNORMAL LOW (ref >40)
LDL Cholesterol: 56 mg/dL (ref <100)
TRIGLYCERIDES: 169 mg/dL — AB (ref <150)
VLDL: 34 mg/dL — AB (ref <30)

## 2016-09-20 LAB — TSH: TSH: 2.97 m[IU]/L (ref 0.40–4.50)

## 2016-09-20 LAB — PSA: PSA: 0.4 ng/mL (ref <=4.0)

## 2016-09-20 LAB — MAGNESIUM: Magnesium: 2.2 mg/dL (ref 1.5–2.5)

## 2016-09-21 LAB — HEMOGLOBIN A1C
Hgb A1c MFr Bld: 5.6 % (ref <5.7)
Mean Plasma Glucose: 114 mg/dL

## 2016-09-21 LAB — VITAMIN D 25 HYDROXY (VIT D DEFICIENCY, FRACTURES): VIT D 25 HYDROXY: 19 ng/mL — AB (ref 30–100)

## 2016-09-28 ENCOUNTER — Telehealth: Payer: Self-pay | Admitting: *Deleted

## 2016-09-28 NOTE — Telephone Encounter (Signed)
Pt aware - will add Vitamin D to regimen  - routed to pcp

## 2016-09-28 NOTE — Telephone Encounter (Signed)
-----   Message from Arnoldo Lenis, MD sent at 09/28/2016  4:02 PM EDT ----- Labs look good other than low vit D. I would suggest just getting some over the counted vitamin D to take daily. Please forward labs to pcp

## 2016-10-19 ENCOUNTER — Ambulatory Visit (INDEPENDENT_AMBULATORY_CARE_PROVIDER_SITE_OTHER): Payer: No Typology Code available for payment source | Admitting: Sports Medicine

## 2016-10-19 ENCOUNTER — Encounter: Payer: Self-pay | Admitting: Sports Medicine

## 2016-10-19 ENCOUNTER — Ambulatory Visit (INDEPENDENT_AMBULATORY_CARE_PROVIDER_SITE_OTHER): Payer: No Typology Code available for payment source

## 2016-10-19 DIAGNOSIS — R05 Cough: Secondary | ICD-10-CM

## 2016-10-19 DIAGNOSIS — I2510 Atherosclerotic heart disease of native coronary artery without angina pectoris: Secondary | ICD-10-CM

## 2016-10-19 DIAGNOSIS — R059 Cough, unspecified: Secondary | ICD-10-CM

## 2016-10-19 MED ORDER — BENZONATATE 200 MG PO CAPS: 200.0000 mg | ORAL_CAPSULE | Freq: Three times a day (TID) | ORAL | 0 refills | Status: DC | PRN

## 2016-10-19 MED ORDER — AZITHROMYCIN 250 MG PO TABS: ORAL_TABLET | ORAL | 0 refills | Status: DC

## 2016-10-19 MED ORDER — PREDNISONE 50 MG PO TABS: ORAL_TABLET | ORAL | 0 refills | Status: DC

## 2016-10-19 NOTE — Progress Notes (Signed)
  Subjective:    CC: Follow-up  HPI: This is a pleasant 51 year old male, we treated him for an acute sinusitis at the last visit, overall he improved but then had some left-sided chest wall pain with increasing cough. No shortness of breath, no chest pain with exertion, no constitutional symptoms.  Past medical history:  Negative.  See flowsheet/record as well for more information.  Surgical history: Negative.  See flowsheet/record as well for more information.  Family history: Negative.  See flowsheet/record as well for more information.  Social history: Negative.  See flowsheet/record as well for more information.  Allergies, and medications have been entered into the medical record, reviewed, and no changes needed.   Review of Systems: No fevers, chills, night sweats, weight loss, chest pain, or shortness of breath.   Objective:    General: Well Developed, well nourished, and in no acute distress.  Neuro: Alert and oriented x3, extra-ocular muscles intact, sensation grossly intact.  HEENT: Normocephalic, atraumatic, pupils equal round reactive to light, neck supple, no masses, no lymphadenopathy, thyroid nonpalpable. Oropharynx, nasopharynx, ear canals unremarkable Skin: Warm and dry, no rashes. Cardiac: Regular rate and rhythm, no murmurs rubs or gallops, no lower extremity edema.  Respiratory: Clear to auscultation bilaterally. Not using accessory muscles, speaking in full sentences.  Impression and Recommendations:    Cough Just got over a acute sinusitis 2 months ago. Now with a cough with a benign exam. Chest x-ray, azithromycin, prednisone, Tessalon Perles. He will return in 2 weeks and we will received with pre-and postbronchodilator spirometry if no better. I think he is just a very atopic guy.  I spent 25 minutes with this patient, greater than 50% was face-to-face time counseling regarding the above diagnoses

## 2016-10-19 NOTE — Assessment & Plan Note (Signed)
Just got over a acute sinusitis 2 months ago. Now with a cough with a benign exam. Chest x-ray, azithromycin, prednisone, Tessalon Perles. He will return in 2 weeks and we will received with pre-and postbronchodilator spirometry if no better. I think he is just a very atopic guy.

## 2016-11-02 ENCOUNTER — Ambulatory Visit (INDEPENDENT_AMBULATORY_CARE_PROVIDER_SITE_OTHER): Payer: No Typology Code available for payment source | Admitting: Sports Medicine

## 2016-11-02 DIAGNOSIS — R053 Chronic cough: Secondary | ICD-10-CM

## 2016-11-02 DIAGNOSIS — J301 Allergic rhinitis due to pollen: Secondary | ICD-10-CM | POA: Diagnosis not present

## 2016-11-02 DIAGNOSIS — R05 Cough: Secondary | ICD-10-CM | POA: Diagnosis not present

## 2016-11-02 MED ORDER — ESOMEPRAZOLE MAGNESIUM 40 MG PO CPDR: DELAYED_RELEASE_CAPSULE | ORAL | 3 refills | Status: DC

## 2016-11-02 MED ORDER — FLUTICASONE PROPIONATE 50 MCG/ACT NA SUSP: NASAL | 3 refills | Status: DC

## 2016-11-02 NOTE — Progress Notes (Signed)
  Subjective:    CC:  Follow-up cough  HPI: [name] returns, he's had a chronic cough now for several months, we treated him several times for sinus infections and bronchitis without resolution. Chest x-ray has been negative, he does endorse throat clearing through the day, sometimes a stuffy nose. Thinking that there was an allergic on as I added Singulair and Xyzal at the last visit which seemed to help significantly. He does eat a lot of spicy food.  Past medical history:  Negative.  See flowsheet/record as well for more information.  Surgical history: Negative.  See flowsheet/record as well for more information.  Family history: Negative.  See flowsheet/record as well for more information.  Social history: Negative.  See flowsheet/record as well for more information.  Allergies, and medications have been entered into the medical record, reviewed, and no changes needed.   Review of Systems: No fevers, chills, night sweats, weight loss, chest pain, or shortness of breath.   Objective:    General: Well Developed, well nourished, and in no acute distress.  Neuro: Alert and oriented x3, extra-ocular muscles intact, sensation grossly intact.  HEENT: Normocephalic, atraumatic, pupils equal round reactive to light, neck supple, no masses, no lymphadenopathy, thyroid nonpalpable.  Skin: Warm and dry, no rashes. Cardiac: Regular rate and rhythm, no murmurs rubs or gallops, no lower extremity edema.  Respiratory: Clear to auscultation bilaterally. Not using accessory muscles, speaking in full sentences.  Impression and Recommendations:    Allergic rhinitis Excellent response to Xyzal and Singulair.  Chronic cough Persistent cough now for months, we have tried multiple courses of antibiotics, steroids. Persistent nonbloody cough. Exam is fairly normal. I'm going to bring him back for pre-and postbronchodilator spirometry, and due to the chronic cough and his smoking history we are going to get  a CT of the chest with IV contrast looking for a mass. In the meantime we will treat him aggressively for acid reflux and postnasal drip with Flonase and Nexium.  I spent 25 minutes with this patient, greater than 50% was face-to-face time counseling regarding the above diagnoses

## 2016-11-02 NOTE — Assessment & Plan Note (Signed)
Persistent cough now for months, we have tried multiple courses of antibiotics, steroids. Persistent nonbloody cough. Exam is fairly normal. I'm going to bring him back for pre-and postbronchodilator spirometry, and due to the chronic cough and his smoking history we are going to get a CT of the chest with IV contrast looking for a mass. In the meantime we will treat him aggressively for acid reflux and postnasal drip with Flonase and Nexium.

## 2016-11-02 NOTE — Assessment & Plan Note (Signed)
Excellent response to Xyzal and Singulair.

## 2016-11-15 ENCOUNTER — Ambulatory Visit (INDEPENDENT_AMBULATORY_CARE_PROVIDER_SITE_OTHER): Payer: No Typology Code available for payment source

## 2016-11-15 ENCOUNTER — Ambulatory Visit (INDEPENDENT_AMBULATORY_CARE_PROVIDER_SITE_OTHER): Payer: No Typology Code available for payment source | Admitting: Sports Medicine

## 2016-11-15 DIAGNOSIS — R05 Cough: Secondary | ICD-10-CM | POA: Diagnosis not present

## 2016-11-15 DIAGNOSIS — R053 Chronic cough: Secondary | ICD-10-CM

## 2016-11-15 DIAGNOSIS — Z951 Presence of aortocoronary bypass graft: Secondary | ICD-10-CM

## 2016-11-15 MED ORDER — IOPAMIDOL (ISOVUE-300) INJECTION 61%
100.0000 mL | Freq: Once | INTRAVENOUS | Status: AC | PRN
  Administered 2016-11-15: 80 mL via INTRAVENOUS

## 2016-11-15 MED ORDER — DEXTROMETHORPHAN POLISTIREX ER 30 MG/5ML PO SUER: 30.0000 mg | Freq: Two times a day (BID) | ORAL | 11 refills | Status: DC

## 2016-11-15 MED ORDER — BENZONATATE 100 MG PO CAPS: 100.0000 mg | ORAL_CAPSULE | Freq: Two times a day (BID) | ORAL | 11 refills | Status: DC

## 2016-11-15 NOTE — Assessment & Plan Note (Addendum)
Persistent nonbloody cough for several months now, multiple courses of steroids and antibiotics have been ineffective. CT of the chest with IV contrast was negative, pre-and postbronchodilator spirometry done today was normal. He did get a small improvement with Xyzal and Singulair suggesting a mild allergic component. No improvement with Nexium, Flonase decreasing the likelihood of acid reflux and postnasal drip being the cause. Cough does occur sometimes when eating, we have discussed concentrating on small bites, chewing well and focusing on his swallow to decrease any chance of aspiration. He has no cardiac or anginal type symptoms. At this point it sounds like this is either mild aspiration or upper airway cough syndrome. I would like him to touch base with pulmonology for assistance, and I am going to add long-term benzonatate and Delsym. We haven't done testing for tuberculosis, or other autoimmune causes of chronic cough.

## 2016-11-15 NOTE — Progress Notes (Signed)
  Subjective:    CC: Pre-and postbronchodilator spirometry  HPI: [name] is a pleasant 51 year old male, he has been through a lot with regards to his chronic cough, see below in the assessment and plan for details, he is here for spirometry testing.  Past medical history:  Negative.  See flowsheet/record as well for more information.  Surgical history: Negative.  See flowsheet/record as well for more information.  Family history: Negative.  See flowsheet/record as well for more information.  Social history: Negative.  See flowsheet/record as well for more information.  Allergies, and medications have been entered into the medical record, reviewed, and no changes needed.   Review of Systems: No fevers, chills, night sweats, weight loss, chest pain, or shortness of breath.   Objective:    General: Well Developed, well nourished, and in no acute distress.  Neuro: Alert and oriented x3, extra-ocular muscles intact, sensation grossly intact.  HEENT: Normocephalic, atraumatic, pupils equal round reactive to light, neck supple, no masses, no lymphadenopathy, thyroid nonpalpable.  Skin: Warm and dry, no rashes. Cardiac: Regular rate and rhythm, no murmurs rubs or gallops, no lower extremity edema.  Respiratory: Clear to auscultation bilaterally. Not using accessory muscles, speaking in full sentences.Mild coughing in the exam room.  Reviewed pre-and postbronchodilator spirometry which was normal.  Impression and Recommendations:    Chronic cough Persistent nonbloody cough for several months now, multiple courses of steroids and antibiotics have been ineffective. CT of the chest with IV contrast was negative, pre-and postbronchodilator spirometry done today was normal. He did get a small improvement with Xyzal and Singulair suggesting a mild allergic component. No improvement with Nexium, Flonase decreasing the likelihood of acid reflux and postnasal drip being the cause. Cough does occur  sometimes when eating, we have discussed concentrating on small bites, chewing well and focusing on his swallow to decrease any chance of aspiration. He has no cardiac or anginal type symptoms. At this point it sounds like this is either mild aspiration or upper airway cough syndrome. I would like him to touch base with pulmonology for assistance, and I am going to add long-term benzonatate and Delsym. We haven't done testing for tuberculosis, or other autoimmune causes of chronic cough.  I spent 25 minutes with this patient, greater than 50% was face-to-face time counseling regarding the above diagnoses, this was separate from the time spent performing the spirometry.

## 2016-12-11 ENCOUNTER — Ambulatory Visit (INDEPENDENT_AMBULATORY_CARE_PROVIDER_SITE_OTHER): Payer: No Typology Code available for payment source | Admitting: Sports Medicine

## 2016-12-11 DIAGNOSIS — R05 Cough: Secondary | ICD-10-CM | POA: Diagnosis not present

## 2016-12-11 DIAGNOSIS — R053 Chronic cough: Secondary | ICD-10-CM

## 2016-12-11 NOTE — Progress Notes (Signed)
  Subjective:    CC: Follow-up  HPI: Chronic cough: After an extensive workup including CT of the chest, pre-and postbronchodilator spirometry, sleep apnea testing, multiple labs, treatment with proton pump inhibitors, leukotriene antagonists, antihistamines, nasal steroids, and training to avoid aspiration, his cough has finally resolved after several months with simply the addition of Tessalon Perles and Delsym. He never went to see the pulmonologist and would like to cancel the appointment. He understands that the likely diagnosis is upper airway cough syndrome.  Past medical history:  Negative.  See flowsheet/record as well for more information.  Surgical history: Negative.  See flowsheet/record as well for more information.  Family history: Negative.  See flowsheet/record as well for more information.  Social history: Negative.  See flowsheet/record as well for more information.  Allergies, and medications have been entered into the medical record, reviewed, and no changes needed.   Review of Systems: No fevers, chills, night sweats, weight loss, chest pain, or shortness of breath.   Objective:    General: Well Developed, well nourished, and in no acute distress.  Neuro: Alert and oriented x3, extra-ocular muscles intact, sensation grossly intact.  HEENT: Normocephalic, atraumatic, pupils equal round reactive to light, neck supple, no masses, no lymphadenopathy, thyroid nonpalpable.  Skin: Warm and dry, no rashes. Cardiac: Regular rate and rhythm, no murmurs rubs or gallops, no lower extremity edema.  Respiratory: Clear to auscultation bilaterally. Not using accessory muscles, speaking in full sentences.  Impression and Recommendations:    Chronic cough At this point cough has resolved with the addition of benzonatate and Delsym.

## 2016-12-11 NOTE — Assessment & Plan Note (Signed)
At this point cough has resolved with the addition of benzonatate and Delsym.

## 2017-01-17 ENCOUNTER — Ambulatory Visit (INDEPENDENT_AMBULATORY_CARE_PROVIDER_SITE_OTHER): Payer: No Typology Code available for payment source | Admitting: Physician Assistant

## 2017-01-17 ENCOUNTER — Encounter: Payer: Self-pay | Admitting: Physician Assistant

## 2017-01-17 VITALS — BP 110/73 | HR 82 | Temp 98.2°F | Wt 238.0 lb

## 2017-01-17 DIAGNOSIS — J029 Acute pharyngitis, unspecified: Secondary | ICD-10-CM

## 2017-01-17 MED ORDER — LIDOCAINE VISCOUS 2 % MT SOLN: 10.0000 mL | OROMUCOSAL | 0 refills | Status: DC | PRN

## 2017-01-17 NOTE — Patient Instructions (Addendum)
-   Gargle and spit magic mouthwash every 3-4 hours as needed for pain - Tylenol 1000mg  every 8 hours as needed for headache/pain. Can continue Ibuprofen - Cepacol throat lozenges - Warm salt water gargles - Follow-up if no improvement in 5-7 days   Pharyngitis Pharyngitis is redness, pain, and swelling (inflammation) of your pharynx. What are the causes? Pharyngitis is usually caused by infection. Most of the time, these infections are from viruses (viral) and are part of a cold. However, sometimes pharyngitis is caused by bacteria (bacterial). Pharyngitis can also be caused by allergies. Viral pharyngitis may be spread from person to person by coughing, sneezing, and personal items or utensils (cups, forks, spoons, toothbrushes). Bacterial pharyngitis may be spread from person to person by more intimate contact, such as kissing. What are the signs or symptoms? Symptoms of pharyngitis include:  Sore throat.  Tiredness (fatigue).  Low-grade fever.  Headache.  Joint pain and muscle aches.  Skin rashes.  Swollen lymph nodes.  Plaque-like film on throat or tonsils (often seen with bacterial pharyngitis).  How is this diagnosed? Your health care provider will ask you questions about your illness and your symptoms. Your medical history, along with a physical exam, is often all that is needed to diagnose pharyngitis. Sometimes, a rapid strep test is done. Other lab tests may also be done, depending on the suspected cause. How is this treated? Viral pharyngitis will usually get better in 3-4 days without the use of medicine. Bacterial pharyngitis is treated with medicines that kill germs (antibiotics). Follow these instructions at home:  Drink enough water and fluids to keep your urine clear or pale yellow.  Only take over-the-counter or prescription medicines as directed by your health care provider: ? If you are prescribed antibiotics, make sure you finish them even if you start to  feel better. ? Do not take aspirin.  Get lots of rest.  Gargle with 8 oz of salt water ( tsp of salt per 1 qt of water) as often as every 1-2 hours to soothe your throat.  Throat lozenges (if you are not at risk for choking) or sprays may be used to soothe your throat. Contact a health care provider if:  You have large, tender lumps in your neck.  You have a rash.  You cough up green, yellow-brown, or bloody spit. Get help right away if:  Your neck becomes stiff.  You drool or are unable to swallow liquids.  You vomit or are unable to keep medicines or liquids down.  You have severe pain that does not go away with the use of recommended medicines.  You have trouble breathing (not caused by a stuffy nose). This information is not intended to replace advice given to you by your health care provider. Make sure you discuss any questions you have with your health care provider. Document Released: 04/23/2005 Document Revised: 09/29/2015 Document Reviewed: 12/29/2012 Elsevier Interactive Patient Education  2017 [employer].

## 2017-01-17 NOTE — Progress Notes (Signed)
HPI:                                                                [name] is a 51 y.o. male who presents to [place]: [place] today for sore throat  Patient reports sore throat x 5 days. Unchanged. Endorses altered taste, frontal headaches, left ear pain, and myalgias. Voice is hoarse. Denies dysphagia. Has tried Ibuprofen which relieved the headache, but did not help with throat pain.  Past Medical History:  Diagnosis Date  . CAD (coronary artery disease) of artery bypass graft 2009   Status post bypass grafting 2009 , status post stress echo 2012 no ischemia.  . Dermatitis    Left upper extremity  . Erectile dysfunction   . Hx of adenomatous colonic polyps 09/02/2014  . Hyperlipidemia, mixed   . Hypotension, unspecified   . Myocardial infarction ([place])    per pt had 2 MI  . Sleep apnea    On 8 cm of water CPAP   Past Surgical History:  Procedure Laterality Date  . CORONARY ARTERY BYPASS GRAFT  2009   Social History  Substance Use Topics  . Smoking status: Former Smoker    Packs/day: 0.20    Years: 5.00    Types: Cigarettes, Cigars    Start date: 05/28/2000    Quit date: 05/07/2005  . Smokeless tobacco: Former Systems developer    Types: [place] date: 05/07/1997     Comment: chewed tobacco x 20 yrs - quit 2000  . Alcohol use 9.0 oz/week    5 Standard drinks or equivalent, 10 Shots of liquor per week     Comment: beer & liquor   family history includes Colon cancer in his paternal uncle; Coronary artery disease in his other; Heart disease in his father and mother; Heart failure in his other; Kidney disease in his other.  ROS: negative except as noted in the HPI  Medications: Current Outpatient Prescriptions  Medication Sig Dispense Refill  . aspirin 81 MG tablet Take 81 mg by mouth daily.      . Cholecalciferol (VITAMIN D PO) Take by mouth.    . dextromethorphan (DELSYM) 30 MG/5ML liquid Take 5 mLs (30 mg total) by mouth 2  (two) times daily. 89 mL 11  . ipratropium (ATROVENT) 0.06 % nasal spray Place 2 sprays into both nostrils every 4 (four) hours as needed for rhinitis. 10 mL 6  . levocetirizine (XYZAL) 5 MG tablet Take 1 tablet (5 mg total) by mouth every evening. 30 tablet 3  . lidocaine (XYLOCAINE) 2 % solution Use as directed 10 mLs in the mouth or throat every 3 (three) hours as needed (throat pain). 100 mL 0  . montelukast (SINGULAIR) 10 MG tablet Take 1 tablet (10 mg total) by mouth at bedtime. 90 tablet 3  . Multiple Vitamin (MULTIVITAMIN) tablet Take 1 tablet by mouth daily.    . Omega-3 Fatty Acids (FISH OIL) 1200 MG CAPS Take 1 capsule by mouth daily.    . rosuvastatin (CRESTOR) 40 MG tablet TAKE 1 TABLET DAILY 90 tablet 3   No current facility-administered medications for this visit.    Allergies  Allergen Reactions  . Penicillins     Unsure of  reaction  . Sulfonamide Derivatives     REACTION: rash,SOB,hives       Objective:  BP 110/73   Pulse 82   Temp 98.2 F (36.8 C) (Oral)   Wt 238 lb (108 kg)   BMI 29.75 kg/m  Gen:  alert, not ill-appearing, no distress, appropriate for age 51: head normocephalic without obvious abnormality, conjunctiva and cornea clear, oropharynx with erythema, no exudates, right TM mildly erythematous, left TM clear, uvula midline, trachea midline Pulm: Normal work of breathing, normal phonation, clear to auscultation bilaterally CV: normal rate, regular rhythm, s1 and s2 distinct, no murmurs Neuro: alert and oriented x 3, no tremor MSK: extremities atraumatic, normal gait and station Skin: intact, no rashes on exposed skin, no jaundice, no cyanosis Lymph: there is right tender tonsillar adenopathy, no cervical adenopathy    No results found for this or any previous visit (from the past 72 hour(s)). No results found.    Assessment and Plan: 51 y.o. male with   1. Acute pharyngitis, unspecified etiology - rapid Strep negative - continue  symptomatic management - Gargle and spit magic mouthwash every 3-4 hours as needed for pain - Tylenol 1000mg  every 8 hours as needed for headache/pain. Can continue Ibuprofen - Cepacol throat lozenges - Warm salt water gargles - lidocaine (XYLOCAINE) 2 % solution; Use as directed 10 mLs in the mouth or throat every 3 (three) hours as needed (throat pain).  Dispense: 100 mL; Refill: 0   Patient education and anticipatory guidance given Patient agrees with treatment plan Follow-up as needed if symptoms worsen or fail to improve  Darlyne Russian PA-C

## 2017-01-24 ENCOUNTER — Institutional Professional Consult (permissible substitution): Payer: No Typology Code available for payment source | Admitting: Pulmonary Disease

## 2017-04-02 ENCOUNTER — Ambulatory Visit (INDEPENDENT_AMBULATORY_CARE_PROVIDER_SITE_OTHER): Payer: No Typology Code available for payment source | Admitting: Family Medicine

## 2017-04-02 ENCOUNTER — Encounter: Payer: Self-pay | Admitting: Family Medicine

## 2017-04-02 VITALS — BP 131/87 | HR 68 | Ht 75.0 in | Wt 236.0 lb

## 2017-04-02 DIAGNOSIS — R519 Headache, unspecified: Secondary | ICD-10-CM

## 2017-04-02 DIAGNOSIS — R51 Headache: Secondary | ICD-10-CM

## 2017-04-02 DIAGNOSIS — J0100 Acute maxillary sinusitis, unspecified: Secondary | ICD-10-CM

## 2017-04-02 MED ORDER — CEFDINIR 300 MG PO CAPS: 300.0000 mg | ORAL_CAPSULE | Freq: Two times a day (BID) | ORAL | 0 refills | Status: DC

## 2017-04-02 NOTE — Progress Notes (Signed)
[name] is a 51 y.o. male who presents to [place]: [place] today for left sided facial pain.   Patient presents with 4 day history of left sided facial pain over zygomatic arch. Patient noted pain with onset of congestion approximately 4 days ago. Pain became significant this morning when patient woke up. Patient felt extremely fatigued this morning, as if he never went to sleep. Endorses facial congestion, sinus fullness, ear fullness, and left sided facial pressure. No symptoms on right side.   Denies shortness of breath, fevers, or GI symptoms. Denies pain with chewing, shaving, or brushing teeth. Does have headaches, but no significant tenderness over temporal artery. Patient had extensive sinus workup over the summer. Has had azithromycin in the past for sinus infection. States that he has allergies and used over the counter medication regularly.   Otherwise, patient in normal state of health prior to onset of symptoms.    Past Medical History:  Diagnosis Date  . CAD (coronary artery disease) of artery bypass graft 2009   Status post bypass grafting 2009 , status post stress echo 2012 no ischemia.  . Dermatitis    Left upper extremity  . Erectile dysfunction   . Hx of adenomatous colonic polyps 09/02/2014  . Hyperlipidemia, mixed   . Hypotension, unspecified   . Myocardial infarction ([place])    per pt had 2 MI  . Sleep apnea    On 8 cm of water CPAP   Past Surgical History:  Procedure Laterality Date  . CORONARY ARTERY BYPASS GRAFT  2009   Social History   Tobacco Use  . Smoking status: Former Smoker    Packs/day: 0.20    Years: 5.00    Pack years: 1.00    Types: Cigarettes, Cigars    Start date: 05/28/2000    Last attempt to quit: 05/07/2005    Years since quitting: 11.9  . Smokeless tobacco: Former Systems developer    Types: Chew    Quit date: 05/07/1997  .  Tobacco comment: chewed tobacco x 20 yrs - quit 2000  Substance Use Topics  . Alcohol use: Yes    Alcohol/week: 9.0 oz    Types: 5 Standard drinks or equivalent, 10 Shots of liquor per week    Comment: beer & liquor   family history includes Colon cancer in his paternal uncle; Coronary artery disease in his other; Heart disease in his father and mother; Heart failure in his other; Kidney disease in his other.  ROS as above:  Medications: Current Outpatient Medications  Medication Sig Dispense Refill  . aspirin 81 MG tablet Take 81 mg by mouth daily.      . Cholecalciferol (VITAMIN D PO) Take by mouth.    . dextromethorphan (DELSYM) 30 MG/5ML liquid Take 5 mLs (30 mg total) by mouth 2 (two) times daily. 89 mL 11  . ipratropium (ATROVENT) 0.06 % nasal spray Place 2 sprays into both nostrils every 4 (four) hours as needed for rhinitis. 10 mL 6  . levocetirizine (XYZAL) 5 MG tablet Take 1 tablet (5 mg total) by mouth every evening. 30 tablet 3  . lidocaine (XYLOCAINE) 2 % solution Use as directed 10 mLs in the mouth or throat every 3 (three) hours as needed (throat pain). 100 mL 0  . montelukast (SINGULAIR) 10 MG tablet Take 1 tablet (10 mg total) by mouth at bedtime. 90 tablet 3  . Multiple Vitamin (MULTIVITAMIN) tablet Take 1 tablet  by mouth daily.    . Omega-3 Fatty Acids (FISH OIL) 1200 MG CAPS Take 1 capsule by mouth daily.    . rosuvastatin (CRESTOR) 40 MG tablet TAKE 1 TABLET DAILY 90 tablet 3  . cefdinir (OMNICEF) 300 MG capsule Take 1 capsule (300 mg total) by mouth 2 (two) times daily. 14 capsule 0   No current facility-administered medications for this visit.    Allergies  Allergen Reactions  . Penicillins     Unsure of reaction  . Sulfonamide Derivatives     REACTION: rash,SOB,hives    Health Maintenance Health Maintenance  Topic Date Due  . HIV Screening  05/29/1979  . INFLUENZA VACCINE  03/07/2018 (Originally 12/05/2016)  . COLONOSCOPY  09/01/2017  . TETANUS/TDAP   01/03/2018     Exam:  BP 131/87   Pulse 68   Ht '6\' 3"'  (1.905 m)   Wt 236 lb (107 kg)   BMI 29.50 kg/m  Gen: Well NAD HEENT: EOMI,  MMM. Tenderness over zygomatic arch. No pain with biting down. No pain with tapping over facial nerve. No LN noted in posterior/anterior cervical chain. No exudate or palatal petechiae.  Lungs: Normal work of breathing. CTABL Heart: RRR no MRG Abd: NABS, Soft. Nondistended, Nontender Exts: Brisk capillary refill, warm and well perfused.   No results found for this or any previous visit (from the past 72 hour(s)). No results found.  Assessment and Plan: 51 y.o. male with likely left sided acute bacterial sinusitis. Patient's signs and symptoms of unilateral facial fullness, congestion, and pain most consistent with maxillary sinusitis. Other considerations include VZV, trigeminal neuralgia, temporal arteritis, TMJ, and dental etiology. All of these seem less likely given acute onset and dull achy pain over maxillary sinus.   Will prescribe omnicef for bacterial sinusitis. Patient can continue home OTC medication for congestion and allergies. Also ordered ESR to rule out temporal arteritis.   Patient should return to follow up if pain does not improve with course of antibiotics.    Orders Placed This Encounter  Procedures  . CBC  . COMPLETE METABOLIC PANEL WITH GFR  . Sed Rate (ESR)   Meds ordered this encounter  Medications  . cefdinir (OMNICEF) 300 MG capsule    Sig: Take 1 capsule (300 mg total) by mouth 2 (two) times daily.    Dispense:  14 capsule    Refill:  0     Discussed warning signs or symptoms. Please see discharge instructions. Patient expresses understanding.

## 2017-04-02 NOTE — Patient Instructions (Signed)
Thank you for coming in today. Continue over the counter medicine.  Get labs today.  Take omnicef twice daily for 1 week.  Recheck as needed.    Sinusitis, Adult Sinusitis is soreness and inflammation of your sinuses. Sinuses are hollow spaces in the bones around your face. Your sinuses are located:  Around your eyes.  In the middle of your forehead.  Behind your nose.  In your cheekbones.  Your sinuses and nasal passages are lined with a stringy fluid (mucus). Mucus normally drains out of your sinuses. When your nasal tissues become inflamed or swollen, the mucus can become trapped or blocked so air cannot flow through your sinuses. This allows bacteria, viruses, and funguses to grow, which leads to infection. Sinusitis can develop quickly and last for 7?10 days (acute) or for more than 12 weeks (chronic). Sinusitis often develops after a cold. What are the causes? This condition is caused by anything that creates swelling in the sinuses or stops mucus from draining, including:  Allergies.  Asthma.  Bacterial or viral infection.  Abnormally shaped bones between the nasal passages.  Nasal growths that contain mucus (nasal polyps).  Narrow sinus openings.  Pollutants, such as chemicals or irritants in the air.  A foreign object stuck in the nose.  A fungal infection. This is rare.  What increases the risk? The following factors may make you more likely to develop this condition:  Having allergies or asthma.  Having had a recent cold or respiratory tract infection.  Having structural deformities or blockages in your nose or sinuses.  Having a weak immune system.  Doing a lot of swimming or diving.  Overusing nasal sprays.  Smoking.  What are the signs or symptoms? The main symptoms of this condition are pain and a feeling of pressure around the affected sinuses. Other symptoms include:  Upper toothache.  Earache.  Headache.  Bad breath.  Decreased  sense of smell and taste.  A cough that may get worse at night.  Fatigue.  Fever.  Thick drainage from your nose. The drainage is often green and it may contain pus (purulent).  Stuffy nose or congestion.  Postnasal drip. This is when extra mucus collects in the throat or back of the nose.  Swelling and warmth over the affected sinuses.  Sore throat.  Sensitivity to light.  How is this diagnosed? This condition is diagnosed based on symptoms, a medical history, and a physical exam. To find out if your condition is acute or chronic, your health care provider may:  Look in your nose for signs of nasal polyps.  Tap over the affected sinus to check for signs of infection.  View the inside of your sinuses using an imaging device that has a light attached (endoscope).  If your health care provider suspects that you have chronic sinusitis, you may also:  Be tested for allergies.  Have a sample of mucus taken from your nose (nasal culture) and checked for bacteria.  Have a mucus sample examined to see if your sinusitis is related to an allergy.  If your sinusitis does not respond to treatment and it lasts longer than 8 weeks, you may have an MRI or CT scan to check your sinuses. These scans also help to determine how severe your infection is. In rare cases, a bone biopsy may be done to rule out more serious types of fungal sinus disease. How is this treated? Treatment for sinusitis depends on the cause and whether your condition is  chronic or acute. If a virus is causing your sinusitis, your symptoms will go away on their own within 10 days. You may be given medicines to relieve your symptoms, including:  Topical nasal decongestants. They shrink swollen nasal passages and let mucus drain from your sinuses.  Antihistamines. These drugs block inflammation that is triggered by allergies. This can help to ease swelling in your nose and sinuses.  Topical nasal corticosteroids. These  are nasal sprays that ease inflammation and swelling in your nose and sinuses.  Nasal saline washes. These rinses can help to get rid of thick mucus in your nose.  If your condition is caused by bacteria, you will be given an antibiotic medicine. If your condition is caused by a fungus, you will be given an antifungal medicine. Surgery may be needed to correct underlying conditions, such as narrow nasal passages. Surgery may also be needed to remove polyps. Follow these instructions at home: Medicines  Take, use, or apply over-the-counter and prescription medicines only as told by your health care provider. These may include nasal sprays.  If you were prescribed an antibiotic medicine, take it as told by your health care provider. Do not stop taking the antibiotic even if you start to feel better. Hydrate and Humidify  Drink enough water to keep your urine clear or pale yellow. Staying hydrated will help to thin your mucus.  Use a cool mist humidifier to keep the humidity level in your home above 50%.  Inhale steam for 10-15 minutes, 3-4 times a day or as told by your health care provider. You can do this in the bathroom while a hot shower is running.  Limit your exposure to cool or dry air. Rest  Rest as much as possible.  Sleep with your head raised (elevated).  Make sure to get enough sleep each night. General instructions  Apply a warm, moist washcloth to your face 3-4 times a day or as told by your health care provider. This will help with discomfort.  Wash your hands often with soap and water to reduce your exposure to viruses and other germs. If soap and water are not available, use hand sanitizer.  Do not smoke. Avoid being around people who are smoking (secondhand smoke).  Keep all follow-up visits as told by your health care provider. This is important. Contact a health care provider if:  You have a fever.  Your symptoms get worse.  Your symptoms do not improve  within 10 days. Get help right away if:  You have a severe headache.  You have persistent vomiting.  You have pain or swelling around your face or eyes.  You have vision problems.  You develop confusion.  Your neck is stiff.  You have trouble breathing. This information is not intended to replace advice given to you by your health care provider. Make sure you discuss any questions you have with your health care provider. Document Released: 04/23/2005 Document Revised: 12/18/2015 Document Reviewed: 02/16/2015 Elsevier Interactive Patient Education  2017 [employer].

## 2017-04-03 LAB — COMPLETE METABOLIC PANEL WITH GFR
AG RATIO: 1.7 (calc) (ref 1.0–2.5)
ALT: 38 U/L (ref 9–46)
AST: 31 U/L (ref 10–35)
Albumin: 4.8 g/dL (ref 3.6–5.1)
Alkaline phosphatase (APISO): 67 U/L (ref 40–115)
BUN: 14 mg/dL (ref 7–25)
CALCIUM: 10 mg/dL (ref 8.6–10.3)
CO2: 21 mmol/L (ref 20–32)
CREATININE: 0.96 mg/dL (ref 0.70–1.33)
Chloride: 104 mmol/L (ref 98–110)
GFR, EST AFRICAN AMERICAN: 105 mL/min/{1.73_m2} (ref >=60)
GFR, EST NON AFRICAN AMERICAN: 91 mL/min/{1.73_m2} (ref >=60)
Globulin: 2.9 g/dL (calc) (ref 1.9–3.7)
Glucose, Bld: 85 mg/dL (ref 65–99)
POTASSIUM: 4.1 mmol/L (ref 3.5–5.3)
Sodium: 137 mmol/L (ref 135–146)
TOTAL PROTEIN: 7.7 g/dL (ref 6.1–8.1)
Total Bilirubin: 0.6 mg/dL (ref 0.2–1.2)

## 2017-04-03 LAB — CBC
HCT: 45.2 % (ref 38.5–50.0)
Hemoglobin: 15.8 g/dL (ref 13.2–17.1)
MCH: 29.1 pg (ref 27.0–33.0)
MCHC: 35 g/dL (ref 32.0–36.0)
MCV: 83.2 fL (ref 80.0–100.0)
MPV: 9.7 fL (ref 7.5–12.5)
PLATELETS: 313 10*3/uL (ref 140–400)
RBC: 5.43 10*6/uL (ref 4.20–5.80)
RDW: 12.4 % (ref 11.0–15.0)
WBC: 11.1 10*3/uL — AB (ref 3.8–10.8)

## 2017-04-03 LAB — SEDIMENTATION RATE: SED RATE: 6 mm/h (ref 0–20)

## 2017-05-06 ENCOUNTER — Other Ambulatory Visit: Payer: Self-pay | Admitting: *Deleted

## 2017-05-06 ENCOUNTER — Other Ambulatory Visit: Payer: Self-pay

## 2017-05-06 DIAGNOSIS — J0110 Acute frontal sinusitis, unspecified: Secondary | ICD-10-CM

## 2017-05-06 MED ORDER — ROSUVASTATIN CALCIUM 40 MG PO TABS: 40.0000 mg | ORAL_TABLET | Freq: Every day | ORAL | 3 refills | Status: DC

## 2017-05-06 MED ORDER — MONTELUKAST SODIUM 10 MG PO TABS: 10.0000 mg | ORAL_TABLET | Freq: Every day | ORAL | 3 refills | Status: DC

## 2017-07-08 ENCOUNTER — Ambulatory Visit: Payer: No Typology Code available for payment source | Admitting: Family Medicine

## 2017-07-08 ENCOUNTER — Encounter: Payer: Self-pay | Admitting: Family Medicine

## 2017-07-08 VITALS — BP 114/76 | HR 85 | Ht 75.0 in | Wt 233.0 lb

## 2017-07-08 DIAGNOSIS — R109 Unspecified abdominal pain: Secondary | ICD-10-CM

## 2017-07-08 DIAGNOSIS — N410 Acute prostatitis: Secondary | ICD-10-CM | POA: Diagnosis not present

## 2017-07-08 LAB — POCT URINALYSIS DIPSTICK
Bilirubin, UA: NEGATIVE
GLUCOSE UA: NEGATIVE
Ketones, UA: NEGATIVE
Leukocytes, UA: NEGATIVE
Nitrite, UA: NEGATIVE
Protein, UA: NEGATIVE
RBC UA: NEGATIVE
Spec Grav, UA: 1.015 (ref 1.010–1.025)
Urobilinogen, UA: 0.2 E.U./dL
pH, UA: 6 (ref 5.0–8.0)

## 2017-07-08 MED ORDER — ONDANSETRON HCL 8 MG PO TABS: 8.0000 mg | ORAL_TABLET | Freq: Three times a day (TID) | ORAL | 1 refills | Status: DC | PRN

## 2017-07-08 MED ORDER — CIPROFLOXACIN HCL 500 MG PO TABS: 500.0000 mg | ORAL_TABLET | Freq: Two times a day (BID) | ORAL | 0 refills | Status: DC

## 2017-07-08 NOTE — Progress Notes (Signed)
[name] is a 52 y.o. male who presents to [facility]: [place] today for pelvis pain.  [name] notes a one-week history of pelvis pain associated with cloudy urine urinary frequency and urgency mild subjective fevers and chills and fatigue.  Symptoms are worsened initially 1 week ago improved over the week and worsened over the weekend.  He is tried some ibuprofen which does help.  The pain does radiate into his testicles bilaterally but denies any testicular mass or tenderness.  Denies any bulging into his groin.  He notes a pertinent history of diverticulitis but notes the pain does not feel like diverticulitis.  He denies any diarrhea or blood in the stool.  He denies any pertinent history of kidney stone or of prostate enlargement or infection.  Patient notes mild nausea but denies any vomiting or diarrhea.    Past Medical History:  Diagnosis Date  . CAD (coronary artery disease) of artery bypass graft 2009   Status post bypass grafting 2009 , status post stress echo 2012 no ischemia.  . Dermatitis    Left upper extremity  . Erectile dysfunction   . Hx of adenomatous colonic polyps 09/02/2014  . Hyperlipidemia, mixed   . Hypotension, unspecified   . Myocardial infarction ([place])    per pt had 2 MI  . Sleep apnea    On 8 cm of water CPAP   Past Surgical History:  Procedure Laterality Date  . CORONARY ARTERY BYPASS GRAFT  2009   Social History   Tobacco Use  . Smoking status: Former Smoker    Packs/day: 0.20    Years: 5.00    Pack years: 1.00    Types: Cigarettes, Cigars    Start date: 05/28/2000    Last attempt to quit: 05/07/2005    Years since quitting: 12.1  . Smokeless tobacco: Former Systems developer    Types: Chew    Quit date: 05/07/1997  . Tobacco comment: chewed tobacco x 20 yrs - quit 2000  Substance Use Topics  . Alcohol use: Yes    Alcohol/week: 9.0 oz    Types:  5 Standard drinks or equivalent, 10 Shots of liquor per week    Comment: beer & liquor   family history includes Colon cancer in his paternal uncle; Coronary artery disease in his other; Heart disease in his father and mother; Heart failure in his other; Kidney disease in his other.  ROS as above:  Medications: Current Outpatient Medications  Medication Sig Dispense Refill  . aspirin 81 MG tablet Take 81 mg by mouth daily.      . cefdinir (OMNICEF) 300 MG capsule Take 1 capsule (300 mg total) by mouth 2 (two) times daily. 14 capsule 0  . Cholecalciferol (VITAMIN D PO) Take by mouth.    . dextromethorphan (DELSYM) 30 MG/5ML liquid Take 5 mLs (30 mg total) by mouth 2 (two) times daily. 89 mL 11  . ipratropium (ATROVENT) 0.06 % nasal spray Place 2 sprays into both nostrils every 4 (four) hours as needed for rhinitis. 10 mL 6  . levocetirizine (XYZAL) 5 MG tablet Take 1 tablet (5 mg total) by mouth every evening. 30 tablet 3  . lidocaine (XYLOCAINE) 2 % solution Use as directed 10 mLs in the mouth or throat every 3 (three) hours as needed (throat pain). 100 mL 0  . montelukast (SINGULAIR) 10 MG tablet Take 1 tablet (10 mg total) by mouth at bedtime. 90 tablet 3  .  Multiple Vitamin (MULTIVITAMIN) tablet Take 1 tablet by mouth daily.    . Omega-3 Fatty Acids (FISH OIL) 1200 MG CAPS Take 1 capsule by mouth daily.    . rosuvastatin (CRESTOR) 40 MG tablet Take 1 tablet (40 mg total) by mouth daily. 90 tablet 3  . ciprofloxacin (CIPRO) 500 MG tablet Take 1 tablet (500 mg total) by mouth 2 (two) times daily. 28 tablet 0  . ondansetron (ZOFRAN) 8 MG tablet Take 1 tablet (8 mg total) by mouth every 8 (eight) hours as needed for nausea or vomiting. 20 tablet 1   No current facility-administered medications for this visit.    Allergies  Allergen Reactions  . Penicillins     Unsure of reaction  . Sulfonamide Derivatives     REACTION: rash,SOB,hives    Health Maintenance Health Maintenance  Topic  Date Due  . HIV Screening  05/29/1979  . INFLUENZA VACCINE  03/07/2018 (Originally 12/05/2016)  . COLONOSCOPY  09/01/2017  . TETANUS/TDAP  01/03/2018     Exam:  BP 114/76   Pulse 85   Ht 6\' 3"  (1.905 m)   Wt 233 lb (105.7 kg)   BMI 29.12 kg/m  Gen: Well NAD non-toxic appearing.  HEENT: EOMI,  MMM Lungs: Normal work of breathing. CTABL Heart: RRR no MRG Abd: NABS, Soft. Nondistended, Mild TTP right to central pelvis. No rebound or guarding. No masses.  Genital exam: Testicles are descended BL with no masses palpated. Non-tender. No hernia present.  Rectal exam reveals no masses. Prostate is normal size and slightly boggy and tender to palpitation. No nodules present.   Exts: Brisk capillary refill, warm and well perfused.    Results for orders placed or performed in visit on 07/08/17 (from the past 72 hour(s))  POCT Urinalysis Dipstick     Status: None   Collection Time: 07/08/17  2:47 PM  Result Value Ref Range   Color, UA yellow    Clarity, UA clear    Glucose, UA negative    Bilirubin, UA negative    Ketones, UA 'negative    Spec Grav, UA 1.015 1.010 - 1.025   Blood, UA negative    pH, UA 6.0 5.0 - 8.0   Protein, UA negative    Urobilinogen, UA 0.2 0.2 or 1.0 E.U./dL   Nitrite, UA negative    Leukocytes, UA Negative Negative   Appearance     Odor     No results found.    Assessment and Plan: 52 y.o. male with pelvis pain likely due to prostatitis.  Urine culture pending.  Plan for workup listed below.  Will treat empirically for prostatitis with Cipro.  Will control nausea with Zofran.  Follow-up if not improved.  Precautions reviewed.   Orders Placed This Encounter  Procedures  . Urine Culture  . CBC  . PSA  . COMPLETE METABOLIC PANEL WITH GFR  . POCT Urinalysis Dipstick   Meds ordered this encounter  Medications  . ciprofloxacin (CIPRO) 500 MG tablet    Sig: Take 1 tablet (500 mg total) by mouth 2 (two) times daily.    Dispense:  28 tablet    Refill:   0  . ondansetron (ZOFRAN) 8 MG tablet    Sig: Take 1 tablet (8 mg total) by mouth every 8 (eight) hours as needed for nausea or vomiting.    Dispense:  20 tablet    Refill:  1     Discussed warning signs or symptoms. Please see discharge instructions. Patient expresses  understanding.

## 2017-07-08 NOTE — Patient Instructions (Signed)
Thank you for coming in today. Take cipro twice daily for 2 weeks.  Use zofran as needed for nausea or vomiting.  Recheck if not better.  If your belly pain worsens, or you have high fever, bad vomiting, blood in your stool or black tarry stool go to the Emergency Room.    Prostatitis Prostatitis is swelling or inflammation of the prostate gland. The prostate is a walnut-sized gland that is involved in the production of semen. It is located below a man's bladder, in front of the rectum. There are four types of prostatitis:  Chronic nonbacterial prostatitis. This is the most common type of prostatitis. It may be associated with a viral infection or autoimmune disorder.  Acute bacterial prostatitis. This is the least common type of prostatitis. It starts quickly and is usually associated with a bladder infection, high fever, and shaking chills. It can occur at any age.  Chronic bacterial prostatitis. This type usually results from acute bacterial prostatitis that happens repeatedly (is recurrent) or has not been treated properly. It can occur in men of any age but is most common among middle-aged men whose prostate has begun to get larger. The symptoms are not as severe as symptoms caused by acute bacterial prostatitis.  Prostatodynia or chronic pelvic pain syndrome (CPPS). This type is also called pelvic floor disorder. It is associated with increased muscular tone in the pelvis surrounding the prostate.  What are the causes? Bacterial prostatitis is caused by infection from bacteria. Chronic nonbacterial prostatitis may be caused by:  Urinary tract infections (UTIs).  Nerve damage.  A response by the body's disease-fighting system (autoimmune response).  Chemicals in the urine.  The causes of the other types of prostatitis are usually not known. What are the signs or symptoms? Symptoms of this condition vary depending upon the type of prostatitis. If you have acute bacterial  prostatitis, you may experience:  Urinary symptoms, such as: ? Painful urination. ? Burning during urination. ? Frequent and sudden urges to urinate. ? Inability to start urinating. ? A weak or interrupted stream of urine.  Vomiting.  Nausea.  Fever.  Chills.  Inability to empty the bladder completely.  Pain in the: ? Muscles or joints. ? Lower back. ? Lower abdomen.  If you have any of the other types of prostatitis, you may experience:  Urinary symptoms, such as: ? Sudden urges to urinate. ? Frequent urination. ? Difficulty starting urination. ? Weak urine stream. ? Dribbling after urination.  Discharge from the urethra. The urethra is a tube that opens at the end of the penis.  Pain in the: ? Testicles. ? Penis or tip of the penis. ? Rectum. ? Area in front of the rectum and below the scrotum (perineum).  Problems with sexual function.  Painful ejaculation.  Bloody semen.  How is this diagnosed? This condition may be diagnosed based on:  A physical and medical exam.  Your symptoms.  A urine test to check for bacteria.  An exam in which a health care provider uses a finger to feel the prostate (digital rectal exam).  A test of a sample of semen.  Blood tests.  Ultrasound.  Removal of prostate tissue to be examined under a microscope (biopsy).  Tests to check how your body handles urine (urodynamic tests).  A test to look inside your bladder or urethra (cystoscopy).  How is this treated? Treatment for this condition depends on the type of prostatitis. Treatment may involve:  Medicines to relieve pain or  inflammation.  Medicines to help relax your muscles.  Physical therapy.  Heat therapy.  Techniques to help you control certain body functions (biofeedback).  Relaxation exercises.  Antibiotic medicine, if your condition is caused by bacteria.  Warm water baths (sitz baths). Sitz baths help with relaxing your pelvic floor muscles,  which helps to relieve pressure on the prostate.  Follow these instructions at home:  Take over-the-counter and prescription medicines only as told by your health care provider.  If you were prescribed an antibiotic, take it as told by your health care provider. Do not stop taking the antibiotic even if you start to feel better.  If physical therapy, biofeedback, or relaxation exercises were prescribed, do exercises as instructed.  Take sitz baths as directed by your health care provider. For a sitz bath, sit in warm water that is deep enough to cover your hips and buttocks.  Keep all follow-up visits as told by your health care provider. This is important. Contact a health care provider if:  Your symptoms get worse.  You have a fever. Get help right away if:  You have chills.  You feel nauseous.  You vomit.  You feel light-headed or feel like you are going to faint.  You are unable to urinate.  You have blood or blood clots in your urine. This information is not intended to replace advice given to you by your health care provider. Make sure you discuss any questions you have with your health care provider. Document Released: 04/20/2000 Document Revised: 01/12/2016 Document Reviewed: 01/12/2016 Elsevier Interactive Patient Education  2017 [employer].

## 2017-07-09 LAB — COMPLETE METABOLIC PANEL WITH GFR
AG Ratio: 1.5 (calc) (ref 1.0–2.5)
ALKALINE PHOSPHATASE (APISO): 72 U/L (ref 40–115)
ALT: 33 U/L (ref 9–46)
AST: 19 U/L (ref 10–35)
Albumin: 4.4 g/dL (ref 3.6–5.1)
BILIRUBIN TOTAL: 0.4 mg/dL (ref 0.2–1.2)
BUN: 11 mg/dL (ref 7–25)
CHLORIDE: 104 mmol/L (ref 98–110)
CO2: 22 mmol/L (ref 20–32)
CREATININE: 0.84 mg/dL (ref 0.70–1.33)
Calcium: 9.5 mg/dL (ref 8.6–10.3)
GFR, Est African American: 116 mL/min/{1.73_m2} (ref >=60)
GFR, Est Non African American: 100 mL/min/{1.73_m2} (ref >=60)
GLOBULIN: 3 g/dL (ref 1.9–3.7)
Glucose, Bld: 84 mg/dL (ref 65–99)
POTASSIUM: 4 mmol/L (ref 3.5–5.3)
SODIUM: 138 mmol/L (ref 135–146)
Total Protein: 7.4 g/dL (ref 6.1–8.1)

## 2017-07-09 LAB — CBC
HEMATOCRIT: 40.1 % (ref 38.5–50.0)
Hemoglobin: 14.2 g/dL (ref 13.2–17.1)
MCH: 29.2 pg (ref 27.0–33.0)
MCHC: 35.4 g/dL (ref 32.0–36.0)
MCV: 82.3 fL (ref 80.0–100.0)
MPV: 9.8 fL (ref 7.5–12.5)
Platelets: 355 10*3/uL (ref 140–400)
RBC: 4.87 10*6/uL (ref 4.20–5.80)
RDW: 12.2 % (ref 11.0–15.0)
WBC: 14.6 10*3/uL — AB (ref 3.8–10.8)

## 2017-07-09 LAB — PSA: PSA: 0.4 ng/mL (ref <=4.0)

## 2017-07-09 LAB — URINE CULTURE
MICRO NUMBER:: 90276164
Result:: NO GROWTH
SPECIMEN QUALITY:: ADEQUATE

## 2017-07-11 ENCOUNTER — Encounter: Payer: Self-pay | Admitting: Family Medicine

## 2017-07-11 ENCOUNTER — Encounter (HOSPITAL_COMMUNITY): Payer: Self-pay

## 2017-07-11 ENCOUNTER — Ambulatory Visit: Payer: No Typology Code available for payment source | Admitting: Family Medicine

## 2017-07-11 ENCOUNTER — Ambulatory Visit (INDEPENDENT_AMBULATORY_CARE_PROVIDER_SITE_OTHER): Payer: No Typology Code available for payment source

## 2017-07-11 ENCOUNTER — Inpatient Hospital Stay (HOSPITAL_COMMUNITY)
Admission: RE | Admit: 2017-07-11 | Discharge: 2017-07-17 | DRG: 392 | Disposition: A | Discharge status: Home / Self Care | Payer: No Typology Code available for payment source | Attending: Surgery | Admitting: Surgery

## 2017-07-11 ENCOUNTER — Emergency Department (HOSPITAL_COMMUNITY): Payer: No Typology Code available for payment source

## 2017-07-11 VITALS — BP 112/74 | HR 75 | Ht 75.0 in | Wt 232.0 lb

## 2017-07-11 DIAGNOSIS — E785 Hyperlipidemia, unspecified: Secondary | ICD-10-CM | POA: Diagnosis present

## 2017-07-11 DIAGNOSIS — I252 Old myocardial infarction: Secondary | ICD-10-CM

## 2017-07-11 DIAGNOSIS — Z79899 Other long term (current) drug therapy: Secondary | ICD-10-CM

## 2017-07-11 DIAGNOSIS — K5732 Diverticulitis of large intestine without perforation or abscess without bleeding: Secondary | ICD-10-CM | POA: Diagnosis not present

## 2017-07-11 DIAGNOSIS — R102 Pelvic and perineal pain: Secondary | ICD-10-CM

## 2017-07-11 DIAGNOSIS — Z880 Allergy status to penicillin: Secondary | ICD-10-CM | POA: Diagnosis not present

## 2017-07-11 DIAGNOSIS — I25700 Atherosclerosis of coronary artery bypass graft(s), unspecified, with unstable angina pectoris: Secondary | ICD-10-CM | POA: Diagnosis not present

## 2017-07-11 DIAGNOSIS — I2510 Atherosclerotic heart disease of native coronary artery without angina pectoris: Secondary | ICD-10-CM | POA: Diagnosis present

## 2017-07-11 DIAGNOSIS — Z7982 Long term (current) use of aspirin: Secondary | ICD-10-CM | POA: Diagnosis not present

## 2017-07-11 DIAGNOSIS — Z951 Presence of aortocoronary bypass graft: Secondary | ICD-10-CM | POA: Diagnosis not present

## 2017-07-11 DIAGNOSIS — K5792 Diverticulitis of intestine, part unspecified, without perforation or abscess without bleeding: Secondary | ICD-10-CM | POA: Diagnosis present

## 2017-07-11 DIAGNOSIS — Z8249 Family history of ischemic heart disease and other diseases of the circulatory system: Secondary | ICD-10-CM | POA: Diagnosis not present

## 2017-07-11 DIAGNOSIS — G4733 Obstructive sleep apnea (adult) (pediatric): Secondary | ICD-10-CM | POA: Diagnosis present

## 2017-07-11 DIAGNOSIS — Z86010 Personal history of colonic polyps: Secondary | ICD-10-CM

## 2017-07-11 DIAGNOSIS — Z87891 Personal history of nicotine dependence: Secondary | ICD-10-CM

## 2017-07-11 DIAGNOSIS — E782 Mixed hyperlipidemia: Secondary | ICD-10-CM | POA: Diagnosis present

## 2017-07-11 DIAGNOSIS — Z882 Allergy status to sulfonamides status: Secondary | ICD-10-CM | POA: Diagnosis not present

## 2017-07-11 DIAGNOSIS — I10 Essential (primary) hypertension: Secondary | ICD-10-CM | POA: Diagnosis present

## 2017-07-11 DIAGNOSIS — I25810 Atherosclerosis of coronary artery bypass graft(s) without angina pectoris: Secondary | ICD-10-CM | POA: Diagnosis present

## 2017-07-11 DIAGNOSIS — Z800 Family history of malignant neoplasm of digestive organs: Secondary | ICD-10-CM

## 2017-07-11 DIAGNOSIS — K5720 Diverticulitis of large intestine with perforation and abscess without bleeding: Principal | ICD-10-CM

## 2017-07-11 DIAGNOSIS — Z8719 Personal history of other diseases of the digestive system: Secondary | ICD-10-CM

## 2017-07-11 HISTORY — DX: Personal history of other diseases of the digestive system: Z87.19

## 2017-07-11 HISTORY — DX: Diverticulitis of intestine, part unspecified, without perforation or abscess without bleeding: K57.92

## 2017-07-11 LAB — COMPREHENSIVE METABOLIC PANEL
ALT: 33 U/L (ref 17–63)
AST: 28 U/L (ref 15–41)
Albumin: 3.8 g/dL (ref 3.5–5.0)
Alkaline Phosphatase: 71 U/L (ref 38–126)
Anion gap: 11 (ref 5–15)
BUN: 8 mg/dL (ref 6–20)
CO2: 25 mmol/L (ref 22–32)
Calcium: 9.4 mg/dL (ref 8.9–10.3)
Chloride: 102 mmol/L (ref 101–111)
Creatinine, Ser: 1.01 mg/dL (ref 0.61–1.24)
GFR calc Af Amer: >60 mL/min (ref >60)
GFR calc non Af Amer: >60 mL/min (ref >60)
Glucose, Bld: 88 mg/dL (ref 65–99)
Potassium: 3.9 mmol/L (ref 3.5–5.1)
Sodium: 138 mmol/L (ref 135–145)
Total Bilirubin: 0.6 mg/dL (ref 0.3–1.2)
Total Protein: 7.4 g/dL (ref 6.5–8.1)

## 2017-07-11 LAB — URINALYSIS, ROUTINE W REFLEX MICROSCOPIC
Bilirubin Urine: NEGATIVE
Glucose, UA: NEGATIVE mg/dL
Hgb urine dipstick: NEGATIVE
KETONES UR: NEGATIVE mg/dL
LEUKOCYTES UA: NEGATIVE
NITRITE: NEGATIVE
PH: 5 (ref 5.0–8.0)
Protein, ur: NEGATIVE mg/dL
Specific Gravity, Urine: >1.046 — ABNORMAL HIGH (ref 1.005–1.030)

## 2017-07-11 LAB — I-STAT CG4 LACTIC ACID, ED: Lactic Acid, Venous: 1.12 mmol/L (ref 0.5–1.9)

## 2017-07-11 MED ORDER — SODIUM CHLORIDE 0.9 % IV SOLN
INTRAVENOUS | Status: DC
  Administered 2017-07-11 – 2017-07-12 (×2): via INTRAVENOUS

## 2017-07-11 MED ORDER — LEVOCETIRIZINE DIHYDROCHLORIDE 5 MG PO TABS: 5.0000 mg | ORAL_TABLET | Freq: Every evening | ORAL | Status: DC

## 2017-07-11 MED ORDER — ACETAMINOPHEN 650 MG RE SUPP: 650.0000 mg | Freq: Four times a day (QID) | RECTAL | Status: DC | PRN

## 2017-07-11 MED ORDER — METRONIDAZOLE IN NACL 5-0.79 MG/ML-% IV SOLN
500.0000 mg | Freq: Once | INTRAVENOUS | Status: AC
  Administered 2017-07-11: 500 mg via INTRAVENOUS
  Filled 2017-07-11: qty 100

## 2017-07-11 MED ORDER — IBUPROFEN 600 MG PO TABS
600.0000 mg | ORAL_TABLET | Freq: Four times a day (QID) | ORAL | Status: DC | PRN
  Administered 2017-07-12: 600 mg via ORAL
  Filled 2017-07-11 (×2): qty 1

## 2017-07-11 MED ORDER — ACETAMINOPHEN 325 MG PO TABS
650.0000 mg | ORAL_TABLET | Freq: Four times a day (QID) | ORAL | Status: DC | PRN
  Administered 2017-07-12 – 2017-07-13 (×3): 650 mg via ORAL
  Filled 2017-07-11 (×3): qty 2

## 2017-07-11 MED ORDER — MONTELUKAST SODIUM 10 MG PO TABS
10.0000 mg | ORAL_TABLET | Freq: Every day | ORAL | Status: DC
  Administered 2017-07-11 – 2017-07-16 (×6): 10 mg via ORAL
  Filled 2017-07-11 (×6): qty 1

## 2017-07-11 MED ORDER — CIPROFLOXACIN IN D5W 400 MG/200ML IV SOLN
400.0000 mg | Freq: Once | INTRAVENOUS | Status: DC
  Filled 2017-07-11: qty 200

## 2017-07-11 MED ORDER — ONDANSETRON HCL 4 MG/2ML IJ SOLN: 4.0000 mg | Freq: Four times a day (QID) | INTRAMUSCULAR | Status: DC | PRN

## 2017-07-11 MED ORDER — MORPHINE SULFATE (PF) 4 MG/ML IV SOLN
2.0000 mg | INTRAVENOUS | Status: DC | PRN
  Administered 2017-07-12 – 2017-07-13 (×3): 4 mg via INTRAVENOUS
  Filled 2017-07-11 (×3): qty 1

## 2017-07-11 MED ORDER — IOPAMIDOL (ISOVUE-300) INJECTION 61%
100.0000 mL | Freq: Once | INTRAVENOUS | Status: AC | PRN
  Administered 2017-07-11: 100 mL via INTRAVENOUS

## 2017-07-11 MED ORDER — ROSUVASTATIN CALCIUM 40 MG PO TABS
40.0000 mg | ORAL_TABLET | Freq: Every day | ORAL | Status: DC
  Administered 2017-07-12 – 2017-07-17 (×6): 40 mg via ORAL
  Filled 2017-07-11 (×6): qty 1

## 2017-07-11 MED ORDER — ONDANSETRON HCL 4 MG PO TABS: 8.0000 mg | ORAL_TABLET | Freq: Three times a day (TID) | ORAL | Status: DC | PRN

## 2017-07-11 MED ORDER — LORATADINE 10 MG PO TABS
10.0000 mg | ORAL_TABLET | Freq: Every day | ORAL | Status: DC
  Administered 2017-07-12 – 2017-07-17 (×6): 10 mg via ORAL
  Filled 2017-07-11 (×6): qty 1

## 2017-07-11 MED ORDER — CEFTRIAXONE SODIUM 2 G IJ SOLR
2.0000 g | INTRAMUSCULAR | Status: DC
  Administered 2017-07-11 – 2017-07-16 (×6): 2 g via INTRAVENOUS
  Filled 2017-07-11 (×6): qty 20

## 2017-07-11 MED ORDER — ONDANSETRON HCL 4 MG PO TABS: 4.0000 mg | ORAL_TABLET | Freq: Four times a day (QID) | ORAL | Status: DC | PRN

## 2017-07-11 MED ORDER — ADULT MULTIVITAMIN W/MINERALS CH
1.0000 | ORAL_TABLET | Freq: Every day | ORAL | Status: DC
  Administered 2017-07-11 – 2017-07-17 (×7): 1 via ORAL
  Filled 2017-07-11 (×9): qty 1

## 2017-07-11 NOTE — Patient Instructions (Addendum)
Go directly to the ER.

## 2017-07-11 NOTE — ED Triage Notes (Signed)
Pt presents with 2 week h/o RLE discomfort, R groin and testicular pain and pain to R side abdomen.  Pt was worked up last week for infection to prostate with negative urinalysis, started on abx since Monday.  Pt had CT today which showed abscess with fluid pocket with diverticulitis. Pt referred here for surgical consult.

## 2017-07-11 NOTE — H&P (Addendum)
History and Physical    [name] DXI:[id number] DOB: 1966-01-04 DOA: 07/11/2017  PCP: Silverio Decamp, MD  Patient coming from: Home  I have personally briefly reviewed patient's old medical records in [place]  Chief Complaint: Abd pain  HPI: [name] is a 52 y.o. male with medical history significant of CAD s/p CABG in 2009, diverticulitis in 2009.  Patient presents to the ED with c/o abd pain.  1.5 week history of RLQ abd pain, pelvic pain.  Worsening 5 days ago.  Seen on 3/4 and put on cipro for presumed prostatitis.  Symptoms didn't improve, seen again today as outpt.  Outpt CT ordered and demonstrated diverticulitis with abscess.  Associated decreased appetite.   ED Course: Gen surg and med consulted for admission.   Review of Systems: As per HPI otherwise 10 point review of systems negative.   Past Medical History:  Diagnosis Date  . CAD (coronary artery disease) of artery bypass graft 2009   Status post bypass grafting 2009 , status post stress echo 2012 no ischemia.  . Dermatitis    Left upper extremity  . Diverticulitis   . Erectile dysfunction   . Hx of adenomatous colonic polyps 09/02/2014  . Hyperlipidemia, mixed   . Hypotension, unspecified   . Myocardial infarction ([place])    per pt had 2 MI  . Sleep apnea    On 8 cm of water CPAP    Past Surgical History:  Procedure Laterality Date  . CORONARY ARTERY BYPASS GRAFT  2009     reports that he quit smoking about 12 years ago. His smoking use included cigarettes and cigars. He started smoking about 17 years ago. He has a 1.00 pack-year smoking history. He quit smokeless tobacco use about 20 years ago. His smokeless tobacco use included chew. He reports that he drinks about 9.0 oz of alcohol per week. He reports that he does not use drugs.  Allergies  Allergen Reactions  . Penicillins     Unsure of reaction  . Sulfonamide Derivatives     REACTION: rash,SOB,hives    Family History   Problem Relation Age of Onset  . Heart disease Mother   . Heart disease Father   . Colon cancer Paternal Uncle   . Kidney disease Other   . Coronary artery disease Other   . Heart failure Other   . Esophageal cancer Neg Hx   . Prostate cancer Neg Hx   . Rectal cancer Neg Hx   . Stomach cancer Neg Hx      Prior to Admission medications   Medication Sig Start Date End Date Taking? Authorizing Provider  aspirin 81 MG tablet Take 81 mg by mouth daily.      [provider]  Cholecalciferol (VITAMIN D PO) Take by mouth.    [provider]  ipratropium (ATROVENT) 0.06 % nasal spray Place 2 sprays into both nostrils every 4 (four) hours as needed for rhinitis. 04/23/16   Gregor Hams, MD  levocetirizine (XYZAL) 5 MG tablet Take 1 tablet (5 mg total) by mouth every evening. 08/14/16   Silverio Decamp, MD  lidocaine (XYLOCAINE) 2 % solution Use as directed 10 mLs in the mouth or throat every 3 (three) hours as needed (throat pain). 01/17/17   Trixie Dredge, PA-C  montelukast (SINGULAIR) 10 MG tablet Take 1 tablet (10 mg total) by mouth at bedtime. 05/06/17   Silverio Decamp, MD  Multiple Vitamin (MULTIVITAMIN) tablet Take  1 tablet by mouth daily.    [provider]  Omega-3 Fatty Acids (FISH OIL) 1200 MG CAPS Take 1 capsule by mouth daily.    [provider]  ondansetron (ZOFRAN) 8 MG tablet Take 1 tablet (8 mg total) by mouth every 8 (eight) hours as needed for nausea or vomiting. 07/08/17   Gregor Hams, MD  rosuvastatin (CRESTOR) 40 MG tablet Take 1 tablet (40 mg total) by mouth daily. 05/06/17   Arnoldo Lenis, MD    Physical Exam: Vitals:   07/11/17 1703  BP: (!) 137/98  Pulse: (!) 101  Resp: 18  Temp: (!) 97.4 F (36.3 C)  TempSrc: Oral  SpO2: 99%    Constitutional: NAD, calm, comfortable Eyes: PERRL, lids and conjunctivae normal ENMT: Mucous membranes are moist. Posterior pharynx clear of any exudate or  lesions.Normal dentition.  Neck: normal, supple, no masses, no thyromegaly Respiratory: clear to auscultation bilaterally, no wheezing, no crackles. Normal respiratory effort. No accessory muscle use.  Cardiovascular: Regular rate and rhythm, no murmurs / rubs / gallops. No extremity edema. 2+ pedal pulses. No carotid bruits.  Abdomen: RLQ TTP Musculoskeletal: no clubbing / cyanosis. No joint deformity upper and lower extremities. Good ROM, no contractures. Normal muscle tone.  Skin: no rashes, lesions, ulcers. No induration Neurologic: CN 2-12 grossly intact. Sensation intact, DTR normal. Strength 5/5 in all 4.  Psychiatric: Normal judgment and insight. Alert and oriented x 3. Normal mood.    Labs on Admission: I have personally reviewed following labs and imaging studies  CBC: Recent Labs  Lab 07/08/17 1515 07/11/17 1155  WBC 14.6* 13.0*  NEUTROABS  --  8,918*  HGB 14.2 16.0  HCT 40.1 46.4  MCV 82.3 83.3  PLT 355 528   Basic Metabolic Panel: Recent Labs  Lab 07/08/17 1515 07/11/17 1155 07/11/17 1900  NA 138 139 138  K 4.0 4.2 3.9  CL 104 105 102  CO2 22 23 25   GLUCOSE 84 95 88  BUN 11 10 8   CREATININE 0.84 1.00 1.01  CALCIUM 9.5 9.6 9.4   GFR: Estimated Creatinine Clearance: 111 mL/min (by C-G formula based on SCr of 1.01 mg/dL). Liver Function Tests: Recent Labs  Lab 07/08/17 1515 07/11/17 1155 07/11/17 1900  AST 19 22 28   ALT 33 30 33  ALKPHOS  --   --  71  BILITOT 0.4 0.4 0.6  PROT 7.4 7.7 7.4  ALBUMIN  --   --  3.8   No results for input(s): LIPASE, AMYLASE in the last 168 hours. No results for input(s): AMMONIA in the last 168 hours. Coagulation Profile: No results for input(s): INR, PROTIME in the last 168 hours. Cardiac Enzymes: No results for input(s): CKTOTAL, CKMB, CKMBINDEX, TROPONINI in the last 168 hours. BNP (last 3 results) No results for input(s): PROBNP in the last 8760 hours. HbA1C: No results for input(s): HGBA1C in the last 72  hours. CBG: No results for input(s): GLUCAP in the last 168 hours. Lipid Profile: No results for input(s): CHOL, HDL, LDLCALC, TRIG, CHOLHDL, LDLDIRECT in the last 72 hours. Thyroid Function Tests: No results for input(s): TSH, T4TOTAL, FREET4, T3FREE, THYROIDAB in the last 72 hours. Anemia Panel: No results for input(s): VITAMINB12, FOLATE, FERRITIN, TIBC, IRON, RETICCTPCT in the last 72 hours. Urine analysis:    Component Value Date/Time   COLORURINE STRAW (A) 07/11/2017 1831   APPEARANCEUR CLEAR 07/11/2017 1831   LABSPEC >1.046 (H) 07/11/2017 1831   PHURINE 5.0 07/11/2017 1831   GLUCOSEU  NEGATIVE 07/11/2017 1831   HGBUR NEGATIVE 07/11/2017 1831   BILIRUBINUR NEGATIVE 07/11/2017 1831   BILIRUBINUR negative 07/08/2017 [place] 07/11/2017 1831   PROTEINUR NEGATIVE 07/11/2017 1831   UROBILINOGEN 0.2 07/08/2017 1447   UROBILINOGEN 1.0 01/08/2008 1143   NITRITE NEGATIVE 07/11/2017 1831   LEUKOCYTESUR NEGATIVE 07/11/2017 1831    Radiological Exams on Admission: Dg Chest 2 View  Result Date: 07/11/2017 CLINICAL DATA:  Sepsis. The patient had a CT in [place], and was told to come straight to the hospital. History of CAD, hypertension, MI. EXAM: CHEST - 2 VIEW COMPARISON:  10/19/2016 FINDINGS: Status post median sternotomy and CABG. The heart is normal in size. No focal consolidations or pleural effusions. No pulmonary edema. Contrast is identified within the colon. IMPRESSION: No active cardiopulmonary disease. Electronically Signed   By: Nolon Nations M.D.   On: 07/11/2017 20:14   Ct Abdomen Pelvis W Contrast  Result Date: 07/11/2017 CLINICAL DATA:  Lower abdominal pain for 3 weeks. EXAM: CT ABDOMEN AND PELVIS WITH CONTRAST TECHNIQUE: Multidetector CT imaging of the abdomen and pelvis was performed using the standard protocol following bolus administration of intravenous contrast. CONTRAST:  154mL ISOVUE-300 IOPAMIDOL (ISOVUE-300) INJECTION 61% COMPARISON:  None.  FINDINGS: Lower chest: No acute abnormality. Hepatobiliary: No focal liver abnormality is seen. No gallstones, gallbladder wall thickening, or biliary dilatation. Pancreas: Unremarkable. No pancreatic ductal dilatation or surrounding inflammatory changes. Spleen: Normal in size without focal abnormality. Adrenals/Urinary Tract: Adrenal glands are unremarkable. Kidneys are normal, without renal calculi, focal lesion, or hydronephrosis. Bladder is unremarkable. Stomach/Bowel: The stomach and appendix appear normal. There is no evidence of bowel obstruction. Sigmoid diverticulitis is noted with 3.7 x 2.8 cm abscess or contained perforation. Vascular/Lymphatic: No significant vascular findings are present. No enlarged abdominal or pelvic lymph nodes. Reproductive: Prostate is unremarkable. Other: No abdominal wall hernia or abnormality. No abdominopelvic ascites. Musculoskeletal: No acute or significant osseous findings. IMPRESSION: Acute sigmoid diverticulitis is noted. There is 3.7 x 2.8 cm adjacent fluid collection concerning for abscess or contained perforation. These results will be called to the ordering clinician or representative by the Radiologist Assistant, and communication documented in the PACS or zVision Dashboard. Electronically Signed   By: Marijo Conception, M.D.   On: 07/11/2017 15:32    EKG: Independently reviewed.  Assessment/Plan Principal Problem:   Diverticulitis of large intestine with abscess without bleeding Active Problems:   Hyperlipidemia   CAD (coronary artery disease) of artery bypass graft    1. Diverticulitis of large intestine with abscess - 1. Rocephin / flagyl 1. Tolerated omnicef course without reaction in 03/2017 2. IVF: NS at 125 cc/hr 3. NPO 4. IR eval for perc drainage in AM 5. Surg at bedside now 2. HLD - continue statin 3. CAD - continue statin, will hold ASA 81 due to planned procedure tomorrow.  DVT prophylaxis: SCDs for now - consider chemoprophylaxis  after IR drainage tomorrow Code Status: Full Family Communication: Wife at bedside Disposition Plan: Home after better Consults called: Dr. Redmond Pulling at bedside Admission status: Admit to inpatient   [place], [facility] Pager [phone]  If 7AM-7PM, please contact day team taking care of patient [email or web address] Password [email or web address]  07/11/2017, 8:22 PM

## 2017-07-11 NOTE — ED Provider Notes (Signed)
[facility] EMERGENCY DEPARTMENT Provider Note   CSN: [id number] Arrival date & time: 07/11/17  1651     History   Chief Complaint Chief Complaint  Patient presents with  . Abdominal Pain    HPI [name] is a 52 y.o. male.  HPI    52 year old male presents today with complaints of abdominal pain.  Patient with 1.5l week history of right lower quadrant abdominal and pelvic worsening 5 days ago.  Patient was seen on March 4 and treated for prostatitis with Cipro.  He notes symptoms did not improve and was seen again today as an outpatient.  Outpatient CT ordered returned showing diverticulitis with abscess versus contained perforation.  Patient with decreased appetite, no p.o. intake today.  No fevers at home.  No vomiting.   Past Medical History:  Diagnosis Date  . CAD (coronary artery disease) of artery bypass graft 2009   Status post bypass grafting 2009 , status post stress echo 2012 no ischemia.  . Dermatitis    Left upper extremity  . Erectile dysfunction   . Hx of adenomatous colonic polyps 09/02/2014  . Hyperlipidemia, mixed   . Hypotension, unspecified   . Myocardial infarction ([place])    per pt had 2 MI  . Sleep apnea    On 8 cm of water CPAP    Patient Active Problem List   Diagnosis Date Noted  . Chronic cough 07/07/2015  . Prostate nodule 05/19/2015  . Hx of adenomatous colonic polyps 09/02/2014  . Annual physical exam 05/20/2014  . Allergic rhinitis 11/17/2013  . Erectile dysfunction   . Sleep apnea   . CAD (coronary artery disease) of artery bypass graft   . Essential hypertension, benign 07/05/2009  . Hyperlipidemia 01/18/2009    Past Surgical History:  Procedure Laterality Date  . CORONARY ARTERY BYPASS GRAFT  2009     Home Medications    Prior to Admission medications   Medication Sig Start Date End Date Taking? Authorizing Provider  aspirin 81 MG tablet Take 81 mg by mouth daily.      [provider]    Cholecalciferol (VITAMIN D PO) Take by mouth.    [provider]  ipratropium (ATROVENT) 0.06 % nasal spray Place 2 sprays into both nostrils every 4 (four) hours as needed for rhinitis. 04/23/16   Gregor Hams, MD  levocetirizine (XYZAL) 5 MG tablet Take 1 tablet (5 mg total) by mouth every evening. 08/14/16   Silverio Decamp, MD  lidocaine (XYLOCAINE) 2 % solution Use as directed 10 mLs in the mouth or throat every 3 (three) hours as needed (throat pain). 01/17/17   Trixie Dredge, PA-C  montelukast (SINGULAIR) 10 MG tablet Take 1 tablet (10 mg total) by mouth at bedtime. 05/06/17   Silverio Decamp, MD  Multiple Vitamin (MULTIVITAMIN) tablet Take 1 tablet by mouth daily.    [provider]  Omega-3 Fatty Acids (FISH OIL) 1200 MG CAPS Take 1 capsule by mouth daily.    [provider]  ondansetron (ZOFRAN) 8 MG tablet Take 1 tablet (8 mg total) by mouth every 8 (eight) hours as needed for nausea or vomiting. 07/08/17   Gregor Hams, MD  rosuvastatin (CRESTOR) 40 MG tablet Take 1 tablet (40 mg total) by mouth daily. 05/06/17   Arnoldo Lenis, MD    Family History Family History  Problem Relation Age of Onset  . Heart disease Mother   . Heart disease Father   .  Colon cancer Paternal Uncle   . Kidney disease Other   . Coronary artery disease Other   . Heart failure Other   . Esophageal cancer Neg Hx   . Prostate cancer Neg Hx   . Rectal cancer Neg Hx   . Stomach cancer Neg Hx     Social History Social History   Tobacco Use  . Smoking status: Former Smoker    Packs/day: 0.20    Years: 5.00    Pack years: 1.00    Types: Cigarettes, Cigars    Start date: 05/28/2000    Last attempt to quit: 05/07/2005    Years since quitting: 12.1  . Smokeless tobacco: Former Systems developer    Types: Chew    Quit date: 05/07/1997  . Tobacco comment: chewed tobacco x 20 yrs - quit 2000  Substance Use Topics  . Alcohol use: Yes    Alcohol/week: 9.0 oz     Types: 5 Standard drinks or equivalent, 10 Shots of liquor per week    Comment: beer & liquor  . Drug use: No     Allergies   Penicillins and Sulfonamide derivatives   Review of Systems Review of Systems  All other systems reviewed and are negative.  Physical Exam Updated Vital Signs BP (!) 137/98   Pulse (!) 101   Temp (!) 97.4 F (36.3 C) (Oral)   Resp 18   SpO2 99%   Physical Exam  Constitutional: He is oriented to person, place, and time. He appears well-developed and well-nourished.  HENT:  Head: Normocephalic and atraumatic.  Eyes: Conjunctivae are normal. Pupils are equal, round, and reactive to light. Right eye exhibits no discharge. Left eye exhibits no discharge. No scleral icterus.  Neck: Normal range of motion. No JVD present. No tracheal deviation present.  Pulmonary/Chest: Effort normal. No stridor.  Abdominal:  Tenderness palpation right lower quadrant-remainder of abdomen soft nontender  Neurological: He is alert and oriented to person, place, and time. Coordination normal.  Psychiatric: He has a normal mood and affect. His behavior is normal. Judgment and thought content normal.  Nursing note and vitals reviewed.   ED Treatments / Results  Labs (all labs ordered are listed, but only abnormal results are displayed) Labs Reviewed  URINALYSIS, ROUTINE W REFLEX MICROSCOPIC - Abnormal; Notable for the following components:      Result Value   Color, Urine STRAW (*)    Specific Gravity, Urine >1.046 (*)    All other components within normal limits  CULTURE, BLOOD (ROUTINE X 2)  CULTURE, BLOOD (ROUTINE X 2)  COMPREHENSIVE METABOLIC PANEL  I-STAT CG4 LACTIC ACID, ED  I-STAT CG4 LACTIC ACID, ED    EKG  EKG Interpretation None       Radiology Ct Abdomen Pelvis W Contrast  Result Date: 07/11/2017 CLINICAL DATA:  Lower abdominal pain for 3 weeks. EXAM: CT ABDOMEN AND PELVIS WITH CONTRAST TECHNIQUE: Multidetector CT imaging of the abdomen and pelvis  was performed using the standard protocol following bolus administration of intravenous contrast. CONTRAST:  129mL ISOVUE-300 IOPAMIDOL (ISOVUE-300) INJECTION 61% COMPARISON:  None. FINDINGS: Lower chest: No acute abnormality. Hepatobiliary: No focal liver abnormality is seen. No gallstones, gallbladder wall thickening, or biliary dilatation. Pancreas: Unremarkable. No pancreatic ductal dilatation or surrounding inflammatory changes. Spleen: Normal in size without focal abnormality. Adrenals/Urinary Tract: Adrenal glands are unremarkable. Kidneys are normal, without renal calculi, focal lesion, or hydronephrosis. Bladder is unremarkable. Stomach/Bowel: The stomach and appendix appear normal. There is no evidence of bowel obstruction. Sigmoid  diverticulitis is noted with 3.7 x 2.8 cm abscess or contained perforation. Vascular/Lymphatic: No significant vascular findings are present. No enlarged abdominal or pelvic lymph nodes. Reproductive: Prostate is unremarkable. Other: No abdominal wall hernia or abnormality. No abdominopelvic ascites. Musculoskeletal: No acute or significant osseous findings. IMPRESSION: Acute sigmoid diverticulitis is noted. There is 3.7 x 2.8 cm adjacent fluid collection concerning for abscess or contained perforation. These results will be called to the ordering clinician or representative by the Radiologist Assistant, and communication documented in the PACS or zVision Dashboard. Electronically Signed   By: Marijo Conception, M.D.   On: 07/11/2017 15:32    Procedures Procedures (including critical care time)  Medications Ordered in ED Medications  metroNIDAZOLE (FLAGYL) IVPB 500 mg (not administered)  cefTRIAXone (ROCEPHIN) 2 g in sodium chloride 0.9 % 100 mL IVPB (not administered)  rosuvastatin (CRESTOR) tablet 40 mg (not administered)  montelukast (SINGULAIR) tablet 10 mg (not administered)  multivitamin tablet 1 tablet (not administered)  ondansetron (ZOFRAN) tablet 8 mg (not  administered)  levocetirizine (XYZAL) tablet 5 mg (not administered)     Initial Impression / Assessment and Plan / ED Course  I have reviewed the triage vital signs and the nursing notes.  Pertinent labs & imaging results that were available during my care of the patient were reviewed by me and considered in my medical decision making (see chart for details).     Final Clinical Impressions(s) / ED Diagnoses   Final diagnoses:  Diverticulitis    Labs: CBC White count 13.0, CMP, i-STAT lactic acid  Imaging: CT abdomen pelvis with contrast  Consults: General surgery  Therapeutics: Cipro, metronidazole, gentamicin  Discharge Meds:   Assessment/Plan: 52 year old male presents today with diverticulitis.  This appears to be complicated by abscess versus contained perforation.  Patient does not appear to peer ill, other than some minor right-sided abdominal tenderness he has no remaining abdominal tenderness.  Findings were discussed with on-call general surgeon who recommended hospital admission with interventional radiology consult tomorrow, with general surgery consultation tonight.  Patient was started on antibiotics here.  Patient has allergy to penicillin, I discussed this he reports that everyone in his family has had a severe allergy to penicillin and he has been told since a young age she should not take penicillin.  Given this patient was started on Cipro, metronidazole.  [facility] will be consulted.  Tried to see patient at bedside, general surgery to see patient at bedside.   ED Discharge Orders    None       Francee Gentile 07/11/17 Fredderick Phenix, MD 07/12/17 [phone]

## 2017-07-11 NOTE — Progress Notes (Signed)
Received pt from EDw/ Dx: Diverticulitis of large intestine with abcess without bleeding. Alert and oriented x4. Skin intact. Oriented to room and staff.

## 2017-07-11 NOTE — Consult Note (Signed)
Reason for Consult: Diverticulitis with abscess Referring Physician: Dellis Filbert Hedges PA-C  [name] is an 52 y.o. male.  HPI: 52 year old male was sent to the emergency room this evening because his outpatient CT scan earlier today showed diverticulitis with an abscess.  He has been having right lower quadrant pain for about a week and a half.  Initially started as right testicular pain which was fairly constant.  He had some urinary frequency with urgency along with subjective fever and chills.  He presented to primary care and his symptoms were felt to be consistent with prostatitis and he was started on ciprofloxacin.  A urinalysis was checked.  He followed up earlier today with ongoing symptoms, poor appetite and a CT scan was ordered.  He states he has not had a bowel movement since Monday which is unusual for him.  He denies any melena or hematochezia.  He has not really had an appetite.  He has had nausea but no vomiting.  He reports a prior episode of diverticulitis in 2006.  He has had at least 2 colonoscopies.  He states his last one was about 2 years ago with [facility].  He denies any prior abdominal surgery.  Past Medical History:  Diagnosis Date  . CAD (coronary artery disease) of artery bypass graft 2009   Status post bypass grafting 2009 , status post stress echo 2012 no ischemia.  . Dermatitis    Left upper extremity  . Diverticulitis   . Erectile dysfunction   . Hx of adenomatous colonic polyps 09/02/2014  . Hyperlipidemia, mixed   . Hypotension, unspecified   . Myocardial infarction ([place])    per pt had 2 MI  . Sleep apnea    On 8 cm of water CPAP    Past Surgical History:  Procedure Laterality Date  . CORONARY ARTERY BYPASS GRAFT  2009    Family History  Problem Relation Age of Onset  . Heart disease Mother   . Heart disease Father   . Colon cancer Paternal Uncle   . Kidney disease Other   . Coronary artery disease Other   . Heart failure Other   .  Esophageal cancer Neg Hx   . Prostate cancer Neg Hx   . Rectal cancer Neg Hx   . Stomach cancer Neg Hx     Social History:  reports that he quit smoking about 12 years ago. His smoking use included cigarettes and cigars. He started smoking about 17 years ago. He has a 1.00 pack-year smoking history. He quit smokeless tobacco use about 20 years ago. His smokeless tobacco use included chew. He reports that he drinks about 9.0 oz of alcohol per week. He reports that he does not use drugs.  Allergies:  Allergies  Allergen Reactions  . Penicillins     Unsure of reaction  Tolerated Omnicef 03/2017.  . Sulfonamide Derivatives     REACTION: rash,SOB,hives    Medications: I have reviewed the patient's current medications.  Results for orders placed or performed during the hospital encounter of 07/11/17 (from the past 48 hour(s))  Urinalysis, Routine w reflex microscopic     Status: Abnormal   Collection Time: 07/11/17  6:31 PM  Result Value Ref Range   Color, Urine STRAW (A) YELLOW   APPearance CLEAR CLEAR   Specific Gravity, Urine >1.046 (H) 1.005 - 1.030   pH 5.0 5.0 - 8.0   Glucose, UA NEGATIVE NEGATIVE mg/dL   Hgb urine dipstick NEGATIVE NEGATIVE   Bilirubin  Urine NEGATIVE NEGATIVE   Ketones, ur NEGATIVE NEGATIVE mg/dL   Protein, ur NEGATIVE NEGATIVE mg/dL   Nitrite NEGATIVE NEGATIVE   Leukocytes, UA NEGATIVE NEGATIVE    Comment: Performed at [facility] [address] [place], [place] [address]  Comprehensive metabolic panel     Status: None   Collection Time: 07/11/17  7:00 PM  Result Value Ref Range   Sodium 138 135 - 145 mmol/L   Potassium 3.9 3.5 - 5.1 mmol/L   Chloride 102 101 - 111 mmol/L   CO2 25 22 - 32 mmol/L   Glucose, Bld 88 65 - 99 mg/dL   BUN 8 6 - 20 mg/dL   Creatinine, Ser 1.01 0.61 - 1.24 mg/dL   Calcium 9.4 8.9 - 10.3 mg/dL   Total Protein 7.4 6.5 - 8.1 g/dL   Albumin 3.8 3.5 - 5.0 g/dL   AST 28 15 - 41 U/L   ALT 33 17 - 63 U/L   Alkaline  Phosphatase 71 38 - 126 U/L   Total Bilirubin 0.6 0.3 - 1.2 mg/dL   GFR calc non Af Amer >60 >60 mL/min   GFR calc Af Amer >60 >60 mL/min    Comment: (NOTE) The eGFR has been calculated using the CKD EPI equation. This calculation has not been validated in all clinical situations. eGFR's persistently <60 mL/min signify possible Chronic Kidney Disease.    Anion gap 11 5 - 15    Comment: Performed at [facility] [address], [place], [place] [address]  I-Stat CG4 Lactic Acid, ED     Status: None   Collection Time: 07/11/17  7:10 PM  Result Value Ref Range   Lactic Acid, Venous 1.12 0.5 - 1.9 mmol/L   CBC Latest Ref Rng & Units 07/11/2017 07/08/2017 04/02/2017  WBC 3.8 - 10.8 Thousand/uL 13.0(H) 14.6(H) 11.1(H)  Hemoglobin 13.2 - 17.1 g/dL 16.0 14.2 15.8  Hematocrit 38.5 - 50.0 % 46.4 40.1 45.2  Platelets 140 - 400 Thousand/uL 360 355 313    Dg Chest 2 View  Result Date: 07/11/2017 CLINICAL DATA:  Sepsis. The patient had a CT in [place], and was told to come straight to the hospital. History of CAD, hypertension, MI. EXAM: CHEST - 2 VIEW COMPARISON:  10/19/2016 FINDINGS: Status post median sternotomy and CABG. The heart is normal in size. No focal consolidations or pleural effusions. No pulmonary edema. Contrast is identified within the colon. IMPRESSION: No active cardiopulmonary disease. Electronically Signed   By: Nolon Nations M.D.   On: 07/11/2017 20:14   Ct Abdomen Pelvis W Contrast  Result Date: 07/11/2017 CLINICAL DATA:  Lower abdominal pain for 3 weeks. EXAM: CT ABDOMEN AND PELVIS WITH CONTRAST TECHNIQUE: Multidetector CT imaging of the abdomen and pelvis was performed using the standard protocol following bolus administration of intravenous contrast. CONTRAST:  150m ISOVUE-300 IOPAMIDOL (ISOVUE-300) INJECTION 61% COMPARISON:  None. FINDINGS: Lower chest: No acute abnormality. Hepatobiliary: No focal liver abnormality is seen. No gallstones, gallbladder wall  thickening, or biliary dilatation. Pancreas: Unremarkable. No pancreatic ductal dilatation or surrounding inflammatory changes. Spleen: Normal in size without focal abnormality. Adrenals/Urinary Tract: Adrenal glands are unremarkable. Kidneys are normal, without renal calculi, focal lesion, or hydronephrosis. Bladder is unremarkable. Stomach/Bowel: The stomach and appendix appear normal. There is no evidence of bowel obstruction. Sigmoid diverticulitis is noted with 3.7 x 2.8 cm abscess or contained perforation. Vascular/Lymphatic: No significant vascular findings are present. No enlarged abdominal or pelvic lymph nodes. Reproductive: Prostate is unremarkable.  Other: No abdominal wall hernia or abnormality. No abdominopelvic ascites. Musculoskeletal: No acute or significant osseous findings. IMPRESSION: Acute sigmoid diverticulitis is noted. There is 3.7 x 2.8 cm adjacent fluid collection concerning for abscess or contained perforation. These results will be called to the ordering clinician or representative by the Radiologist Assistant, and communication documented in the PACS or zVision Dashboard. Electronically Signed   By: Marijo Conception, M.D.   On: 07/11/2017 15:32    Review of Systems  Constitutional: Positive for fever (SUBJECTIVE). Negative for weight loss.  HENT: Negative for nosebleeds.   Eyes: Negative for blurred vision.  Respiratory: Negative for shortness of breath.   Cardiovascular: Negative for chest pain, palpitations, orthopnea and PND.       Denies DOE  Gastrointestinal: Positive for abdominal pain, constipation and nausea. Negative for blood in stool and melena.  Genitourinary: Negative for dysuria and hematuria.  Musculoskeletal: Negative.   Skin: Negative for itching and rash.  Neurological: Negative for dizziness, focal weakness, seizures, loss of consciousness and headaches.       Denies TIAs, amaurosis fugax  Endo/Heme/Allergies: Does not bruise/bleed easily.    Psychiatric/Behavioral: The patient is not nervous/anxious.    Blood pressure 120/64, pulse 87, temperature 98.5 F (36.9 C), temperature source Oral, resp. rate 18, height '6\' 3"'  (1.905 m), weight 101.4 kg (223 lb 8.7 oz), SpO2 98 %. Physical Exam  Vitals reviewed. Constitutional: He is oriented to person, place, and time. He appears well-developed and well-nourished. No distress.  Nontoxic/ resting comfortably  HENT:  Head: Normocephalic and atraumatic.  Right Ear: External ear normal.  Left Ear: External ear normal.  Eyes: Conjunctivae are normal. No scleral icterus.  Neck: Normal range of motion. Neck supple. No tracheal deviation present. No thyromegaly present.  Cardiovascular: Normal rate and normal heart sounds.  Respiratory: Effort normal and breath sounds normal. No stridor. No respiratory distress. He has no wheezes.    GI: Soft. Normal appearance. He exhibits no distension. There is tenderness in the right lower quadrant and suprapubic area. There is no rigidity and no rebound.    Musculoskeletal: He exhibits no edema or tenderness.  Lymphadenopathy:    He has no cervical adenopathy.  Neurological: He is alert and oriented to person, place, and time. He exhibits normal muscle tone.  Skin: Skin is warm and dry. No rash noted. He is not diaphoretic. No erythema. No pallor.  Psychiatric: He has a normal mood and affect. His behavior is normal. Judgment and thought content normal.    Assessment/Plan: Sigmoid diverticulitis with abscess Coronary artery disease Obstructive sleep apnea on CPAP Hyperlipidemia  He is nontoxic appearing.  He does not have peritonitis.  He has a mild leukocytosis.  There is no evidence of gross perforation.  There is no free air.  He has a normal lactate.  Therefore I believe he does not need urgent surgical intervention  Recommend n.p.o. except ice chips and meds IV fluids IV antibiotic Interventional radiology consult in the morning to see  if abscess is amenable to percutaneous drainage  I discussed diverticulosis and diverticulitis with the patient and his wife.  We discussed the typical hospitalization.  We discussed that generally this improves with the hopeful goal that he can have outpatient follow-up to discuss elective colectomy.  We discussed that sometimes his condition can worsen and/or fail to improve thus changing our recommendation of management  Discussed with Dr. Esau Grew. Redmond Pulling, MD, FACS General, Bariatric, & [facility]   Greer Pickerel 07/11/2017, 10:20 PM

## 2017-07-11 NOTE — Progress Notes (Addendum)
[name] is a 52 y.o. male who presents to [facility] today for pelvic pain.  [name] was seen on March 4 for lower abdominal or pelvic pain thought to be likely prostatitis.  He had laboratory work showing white blood cell count at 14.5 with normal PSA and metabolic panel.  Urine culture was negative.  He was treated empirically with Cipro which has not helped.  He notes worsening lower pelvic pain associated with bloating decreased appetite and no stool since Monday the fourth.  He notes he is occasionally passing gas per rectum but much less than usual.  He notes the pain is worsening.  He denies fevers or chills.  He denies vomiting.   Past Medical History:  Diagnosis Date  . CAD (coronary artery disease) of artery bypass graft 2009   Status post bypass grafting 2009 , status post stress echo 2012 no ischemia.  . Dermatitis    Left upper extremity  . Erectile dysfunction   . Hx of adenomatous colonic polyps 09/02/2014  . Hyperlipidemia, mixed   . Hypotension, unspecified   . Myocardial infarction ([place])    per pt had 2 MI  . Sleep apnea    On 8 cm of water CPAP   Past Surgical History:  Procedure Laterality Date  . CORONARY ARTERY BYPASS GRAFT  2009   Social History   Tobacco Use  . Smoking status: Former Smoker    Packs/day: 0.20    Years: 5.00    Pack years: 1.00    Types: Cigarettes, Cigars    Start date: 05/28/2000    Last attempt to quit: 05/07/2005    Years since quitting: 12.1  . Smokeless tobacco: Former Systems developer    Types: Chew    Quit date: 05/07/1997  . Tobacco comment: chewed tobacco x 20 yrs - quit 2000  Substance Use Topics  . Alcohol use: Yes    Alcohol/week: 9.0 oz    Types: 5 Standard drinks or equivalent, 10 Shots of liquor per week    Comment: beer & liquor   family history includes Colon cancer in his paternal uncle; Coronary artery  disease in his other; Heart disease in his father and mother; Heart failure in his other; Kidney disease in his other.  ROS as above: No headache, visual changes, , vomiting, diarrhea, , dizziness, , skin rash, fevers, chills, night sweats, weight loss, swollen lymph nodes, body aches, joint swelling, muscle aches, chest pain, shortness of breath, mood changes, visual or auditory hallucinations.   Medications: Current Outpatient Medications  Medication Sig Dispense Refill  . aspirin 81 MG tablet Take 81 mg by mouth daily.      . cefdinir (OMNICEF) 300 MG capsule Take 1 capsule (300 mg total) by mouth 2 (two) times daily. 14 capsule 0  . Cholecalciferol (VITAMIN D PO) Take by mouth.    . ciprofloxacin (CIPRO) 500 MG tablet Take 1 tablet (500 mg total) by mouth 2 (two) times daily. 28 tablet 0  . dextromethorphan (DELSYM) 30 MG/5ML liquid Take 5 mLs (30 mg total) by mouth 2 (two) times daily. 89 mL 11  . ipratropium (ATROVENT) 0.06 % nasal spray Place 2 sprays into both nostrils every 4 (four) hours as needed for rhinitis. 10 mL 6  . levocetirizine (XYZAL) 5 MG tablet Take 1 tablet (5 mg total) by mouth every evening. 30 tablet 3  . lidocaine (XYLOCAINE) 2 % solution Use as directed 10  mLs in the mouth or throat every 3 (three) hours as needed (throat pain). 100 mL 0  . montelukast (SINGULAIR) 10 MG tablet Take 1 tablet (10 mg total) by mouth at bedtime. 90 tablet 3  . Multiple Vitamin (MULTIVITAMIN) tablet Take 1 tablet by mouth daily.    . Omega-3 Fatty Acids (FISH OIL) 1200 MG CAPS Take 1 capsule by mouth daily.    . ondansetron (ZOFRAN) 8 MG tablet Take 1 tablet (8 mg total) by mouth every 8 (eight) hours as needed for nausea or vomiting. 20 tablet 1  . rosuvastatin (CRESTOR) 40 MG tablet Take 1 tablet (40 mg total) by mouth daily. 90 tablet 3   No current facility-administered medications for this visit.    Allergies  Allergen Reactions  . Penicillins     Unsure of reaction  .  Sulfonamide Derivatives     REACTION: rash,SOB,hives    Health Maintenance Health Maintenance  Topic Date Due  . HIV Screening  05/29/1979  . INFLUENZA VACCINE  03/07/2018 (Originally 12/05/2016)  . COLONOSCOPY  09/01/2017  . TETANUS/TDAP  01/03/2018     Exam:  BP 112/74   Pulse 75   Ht 6\' 3"  (1.905 m)   Wt 232 lb (105.2 kg)   BMI 29.00 kg/m  Gen: Well NAD HEENT: EOMI,  MMM Lungs: Normal work of breathing. CTABL Heart: RRR no MRG Abd: Hypoactive bowel sounds, Soft. Nondistended, tender to palpation lower abdomen and pelvis with no significant rebound or guarding.  No masses palpated. Exts: Brisk capillary refill, warm and well perfused.    Results for orders placed or performed in visit on 07/11/17 (from the past 72 hour(s))  CBC with Differential/Platelet     Status: Abnormal   Collection Time: 07/11/17 11:55 AM  Result Value Ref Range   WBC 13.0 (H) 3.8 - 10.8 Thousand/uL   RBC 5.57 4.20 - 5.80 Million/uL   Hemoglobin 16.0 13.2 - 17.1 g/dL   HCT 46.4 38.5 - 50.0 %   MCV 83.3 80.0 - 100.0 fL   MCH 28.7 27.0 - 33.0 pg   MCHC 34.5 32.0 - 36.0 g/dL   RDW 12.0 11.0 - 15.0 %   Platelets 360 140 - 400 Thousand/uL   MPV 9.7 7.5 - 12.5 fL   Neutro Abs 8,918 (H) 1,500 - 7,800 cells/uL   Lymphs Abs 2,912 850 - 3,900 cells/uL   WBC mixed population 728 200 - 950 cells/uL   Eosinophils Absolute 325 15 - 500 cells/uL   Basophils Absolute 117 0 - 200 cells/uL   Neutrophils Relative % 68.6 %   Total Lymphocyte 22.4 %   Monocytes Relative 5.6 %   Eosinophils Relative 2.5 %   Basophils Relative 0.9 %   Ct Abdomen Pelvis W Contrast  Result Date: 07/11/2017 CLINICAL DATA:  Lower abdominal pain for 3 weeks. EXAM: CT ABDOMEN AND PELVIS WITH CONTRAST TECHNIQUE: Multidetector CT imaging of the abdomen and pelvis was performed using the standard protocol following bolus administration of intravenous contrast. CONTRAST:  121mL ISOVUE-300 IOPAMIDOL (ISOVUE-300) INJECTION 61% COMPARISON:   None. FINDINGS: Lower chest: No acute abnormality. Hepatobiliary: No focal liver abnormality is seen. No gallstones, gallbladder wall thickening, or biliary dilatation. Pancreas: Unremarkable. No pancreatic ductal dilatation or surrounding inflammatory changes. Spleen: Normal in size without focal abnormality. Adrenals/Urinary Tract: Adrenal glands are unremarkable. Kidneys are normal, without renal calculi, focal lesion, or hydronephrosis. Bladder is unremarkable. Stomach/Bowel: The stomach and appendix appear normal. There is no evidence of bowel obstruction. Sigmoid diverticulitis  is noted with 3.7 x 2.8 cm abscess or contained perforation. Vascular/Lymphatic: No significant vascular findings are present. No enlarged abdominal or pelvic lymph nodes. Reproductive: Prostate is unremarkable. Other: No abdominal wall hernia or abnormality. No abdominopelvic ascites. Musculoskeletal: No acute or significant osseous findings. IMPRESSION: Acute sigmoid diverticulitis is noted. There is 3.7 x 2.8 cm adjacent fluid collection concerning for abscess or contained perforation. These results will be called to the ordering clinician or representative by the Radiologist Assistant, and communication documented in the PACS or zVision Dashboard. Electronically Signed   By: Marijo Conception, M.D.   On: 07/11/2017 15:32      Assessment and Plan: 52 y.o. male with worsening abdominal and pelvic pain with decrease gas per rectum and decreased stool is concerning for small bowel obstruction or mass-effect or partial small bowel obstruction or some other severe potentially life-threatening medical problem.  At this time the diagnosis is quite broad and unknown.  Azeez additionally had a mild white count on Monday.  Plan for stat CT scan of the abdomen and pelvis today.  Additionally will repeat CBC metabolic panel and obtain lactic acid.  Will determine plan based on CT scan results.   Addendum: CT scan results back shows  diverticulitis with abscess. After discussion with on call gen surgery PA plan transfer to ED for further evaluation and treatment. I am hopeful that his abscess may be treatable with IR drainage.    Orders Placed This Encounter  Procedures  . CT Abdomen Pelvis W Contrast    Standing Status:   Future    Number of Occurrences:   1    Standing Expiration Date:   10/12/2018    Order Specific Question:   If indicated for the ordered procedure, I authorize the administration of contrast media per Radiology protocol    Answer:   Yes    Order Specific Question:   Preferred imaging location?    Answer:   [facility]    Order Specific Question:   Radiology Contrast Protocol - do NOT remove file path    Answer:   [email or web address]  . CBC with Differential/Platelet  . COMPLETE METABOLIC PANEL WITH GFR  . Lactic acid, plasma   No orders of the defined types were placed in this encounter.    Discussed warning signs or symptoms. Please see discharge instructions. Patient expresses understanding.

## 2017-07-12 ENCOUNTER — Other Ambulatory Visit: Payer: Self-pay

## 2017-07-12 ENCOUNTER — Inpatient Hospital Stay (HOSPITAL_COMMUNITY): Payer: No Typology Code available for payment source

## 2017-07-12 LAB — CBC WITH DIFFERENTIAL/PLATELET
BASOS PCT: 0.9 %
Basophils Absolute: 117 cells/uL (ref 0–200)
EOS ABS: 325 {cells}/uL (ref 15–500)
Eosinophils Relative: 2.5 %
HCT: 46.4 % (ref 38.5–50.0)
Hemoglobin: 16 g/dL (ref 13.2–17.1)
Lymphs Abs: 2912 cells/uL (ref 850–3900)
MCH: 28.7 pg (ref 27.0–33.0)
MCHC: 34.5 g/dL (ref 32.0–36.0)
MCV: 83.3 fL (ref 80.0–100.0)
MONOS PCT: 5.6 %
MPV: 9.7 fL (ref 7.5–12.5)
Neutro Abs: 8918 cells/uL — ABNORMAL HIGH (ref 1500–7800)
Neutrophils Relative %: 68.6 %
PLATELETS: 360 10*3/uL (ref 140–400)
RBC: 5.57 10*6/uL (ref 4.20–5.80)
RDW: 12 % (ref 11.0–15.0)
TOTAL LYMPHOCYTE: 22.4 %
WBC mixed population: 728 cells/uL (ref 200–950)
WBC: 13 10*3/uL — ABNORMAL HIGH (ref 3.8–10.8)

## 2017-07-12 LAB — COMPLETE METABOLIC PANEL WITH GFR
AG Ratio: 1.3 (calc) (ref 1.0–2.5)
ALKALINE PHOSPHATASE (APISO): 78 U/L (ref 40–115)
ALT: 30 U/L (ref 9–46)
AST: 22 U/L (ref 10–35)
Albumin: 4.3 g/dL (ref 3.6–5.1)
BILIRUBIN TOTAL: 0.4 mg/dL (ref 0.2–1.2)
BUN: 10 mg/dL (ref 7–25)
CHLORIDE: 105 mmol/L (ref 98–110)
CO2: 23 mmol/L (ref 20–32)
Calcium: 9.6 mg/dL (ref 8.6–10.3)
Creat: 1 mg/dL (ref 0.70–1.33)
GFR, Est African American: 99 mL/min/{1.73_m2} (ref >=60)
GFR, Est Non African American: 86 mL/min/{1.73_m2} (ref >=60)
GLUCOSE: 95 mg/dL (ref 65–99)
Globulin: 3.4 g/dL (calc) (ref 1.9–3.7)
Potassium: 4.2 mmol/L (ref 3.5–5.3)
Sodium: 139 mmol/L (ref 135–146)
TOTAL PROTEIN: 7.7 g/dL (ref 6.1–8.1)

## 2017-07-12 LAB — BASIC METABOLIC PANEL
Anion gap: 10 (ref 5–15)
BUN: 9 mg/dL (ref 6–20)
CALCIUM: 8.8 mg/dL — AB (ref 8.9–10.3)
CO2: 22 mmol/L (ref 22–32)
Chloride: 107 mmol/L (ref 101–111)
Creatinine, Ser: 1.01 mg/dL (ref 0.61–1.24)
GFR calc non Af Amer: >60 mL/min (ref >60)
GLUCOSE: 92 mg/dL (ref 65–99)
POTASSIUM: 3.9 mmol/L (ref 3.5–5.1)
Sodium: 139 mmol/L (ref 135–145)

## 2017-07-12 LAB — CBC
HEMATOCRIT: 38.9 % — AB (ref 39.0–52.0)
HEMOGLOBIN: 13 g/dL (ref 13.0–17.0)
MCH: 28.7 pg (ref 26.0–34.0)
MCHC: 33.4 g/dL (ref 30.0–36.0)
MCV: 85.9 fL (ref 78.0–100.0)
Platelets: 314 10*3/uL (ref 150–400)
RBC: 4.53 MIL/uL (ref 4.22–5.81)
RDW: 12.2 % (ref 11.5–15.5)
WBC: 12.5 10*3/uL — AB (ref 4.0–10.5)

## 2017-07-12 LAB — PROTIME-INR
INR: 1.19
Prothrombin Time: 15 seconds (ref 11.4–15.2)

## 2017-07-12 LAB — LACTIC ACID, PLASMA: LACTIC ACID: 1.5 mmol/L (ref 0.4–1.8)

## 2017-07-12 LAB — HIV ANTIBODY (ROUTINE TESTING W REFLEX): HIV SCREEN 4TH GENERATION: NONREACTIVE

## 2017-07-12 MED ORDER — FENTANYL CITRATE (PF) 100 MCG/2ML IJ SOLN
INTRAMUSCULAR | Status: AC | PRN
  Administered 2017-07-12 (×2): 50 ug via INTRAVENOUS

## 2017-07-12 MED ORDER — FENTANYL CITRATE (PF) 100 MCG/2ML IJ SOLN
INTRAMUSCULAR | Status: AC
  Filled 2017-07-12: qty 4

## 2017-07-12 MED ORDER — MIDAZOLAM HCL 2 MG/2ML IJ SOLN
INTRAMUSCULAR | Status: AC | PRN
  Administered 2017-07-12 (×2): 1 mg via INTRAVENOUS

## 2017-07-12 MED ORDER — ENOXAPARIN SODIUM 40 MG/0.4ML ~~LOC~~ SOLN
40.0000 mg | SUBCUTANEOUS | Status: DC
  Administered 2017-07-13 – 2017-07-16 (×4): 40 mg via SUBCUTANEOUS
  Filled 2017-07-12 (×4): qty 0.4

## 2017-07-12 MED ORDER — LIDOCAINE HCL 1 % IJ SOLN
INTRAMUSCULAR | Status: AC
  Filled 2017-07-12: qty 20

## 2017-07-12 MED ORDER — KCL IN DEXTROSE-NACL 20-5-0.45 MEQ/L-%-% IV SOLN
INTRAVENOUS | Status: DC
  Administered 2017-07-12 – 2017-07-14 (×3): via INTRAVENOUS
  Filled 2017-07-12 (×3): qty 1000

## 2017-07-12 MED ORDER — MIDAZOLAM HCL 2 MG/2ML IJ SOLN
INTRAMUSCULAR | Status: AC
  Filled 2017-07-12: qty 4

## 2017-07-12 NOTE — Sedation Documentation (Signed)
Patient is resting comfortably. 

## 2017-07-12 NOTE — Progress Notes (Signed)
Lovenox per pharmacy for VTE prophylaxis.  62 YOM admitted today with diverticulitis, sigmoid abscess, s/p drain placement. Pharmacy is consulted to start lovenox for VTE prophylaxis. BMI 27, crcl > 100 ml/min. Per MD note, to start lovenox tomorrow.  Baseline CBC wnl.  Plan: - Lovenox 40 mg sq Q 24 hrs, first dose tomorrow at 1200 - Pharmacy sign off.  Thanks!   Maryanna Shape, PharmD, BCPS  Clinical Pharmacist  Pager: [phone]

## 2017-07-12 NOTE — Progress Notes (Signed)
Patient refused using hospital CPAP

## 2017-07-12 NOTE — Progress Notes (Signed)
Patient ID: [name], male   DOB: [birth year], 52 y.o.   MRN: [id number]       Subjective: Patient just got back from drain placement in IR.  Feels slightly better, but also got pain medications for procedure.  Objective: Vital signs in last 24 hours: Temp:  [97.4 F (36.3 C)-100.5 F (38.1 C)] 98.4 F (36.9 C) (03/08 0441) Pulse Rate:  [68-101] 70 (03/08 0920) Resp:  [12-18] 12 (03/08 0920) BP: (99-137)/(57-98) 102/69 (03/08 0920) SpO2:  [95 %-99 %] 99 % (03/08 0920) Weight:  [223 lb 8.7 oz (101.4 kg)-232 lb (105.2 kg)] 223 lb 8.7 oz (101.4 kg) (03/07 2103) Last BM Date: 07/08/17  Intake/Output from previous day: 03/07 0701 - 03/08 0700 In: 1062.5 [I.V.:962.5; IV Piggyback:100] Out: 380 [Urine:380] Intake/Output this shift: Total I/O In: -  Out: 8 [Drains:8]  PE: Abd: soft, tender greatest in midline and RLQ.  Midline drain in place with mostly just a small amount of bloody output present.  +BS Heart: regular Lungs: CTAB  Lab Results:  Recent Labs    07/11/17 1155 07/12/17 0609  WBC 13.0* 12.5*  HGB 16.0 13.0  HCT 46.4 38.9*  PLT 360 314   BMET Recent Labs    07/11/17 1900 07/12/17 0609  NA 138 139  K 3.9 3.9  CL 102 107  CO2 25 22  GLUCOSE 88 92  BUN 8 9  CREATININE 1.01 1.01  CALCIUM 9.4 8.8*   PT/INR Recent Labs    07/12/17 0737  LABPROT 15.0  INR 1.19   CMP     Component Value Date/Time   NA 139 07/12/2017 0609   K 3.9 07/12/2017 0609   CL 107 07/12/2017 0609   CO2 22 07/12/2017 0609   GLUCOSE 92 07/12/2017 0609   BUN 9 07/12/2017 0609   CREATININE 1.01 07/12/2017 0609   CREATININE 1.00 07/11/2017 1155   CALCIUM 8.8 (L) 07/12/2017 0609   PROT 7.4 07/11/2017 1900   ALBUMIN 3.8 07/11/2017 1900   AST 28 07/11/2017 1900   ALT 33 07/11/2017 1900   ALKPHOS 71 07/11/2017 1900   BILITOT 0.6 07/11/2017 1900   GFRNONAA >60 07/12/2017 0609   GFRNONAA 86 07/11/2017 1155   GFRAA >60 07/12/2017 0609   GFRAA 99 07/11/2017 1155    Lipase  No results found for: LIPASE     Studies/Results: Dg Chest 2 View  Result Date: 07/11/2017 CLINICAL DATA:  Sepsis. The patient had a CT in [place], and was told to come straight to the hospital. History of CAD, hypertension, MI. EXAM: CHEST - 2 VIEW COMPARISON:  10/19/2016 FINDINGS: Status post median sternotomy and CABG. The heart is normal in size. No focal consolidations or pleural effusions. No pulmonary edema. Contrast is identified within the colon. IMPRESSION: No active cardiopulmonary disease. Electronically Signed   By: Nolon Nations M.D.   On: 07/11/2017 20:14   Ct Abdomen Pelvis W Contrast  Result Date: 07/11/2017 CLINICAL DATA:  Lower abdominal pain for 3 weeks. EXAM: CT ABDOMEN AND PELVIS WITH CONTRAST TECHNIQUE: Multidetector CT imaging of the abdomen and pelvis was performed using the standard protocol following bolus administration of intravenous contrast. CONTRAST:  145mL ISOVUE-300 IOPAMIDOL (ISOVUE-300) INJECTION 61% COMPARISON:  None. FINDINGS: Lower chest: No acute abnormality. Hepatobiliary: No focal liver abnormality is seen. No gallstones, gallbladder wall thickening, or biliary dilatation. Pancreas: Unremarkable. No pancreatic ductal dilatation or surrounding inflammatory changes. Spleen: Normal in size without focal abnormality. Adrenals/Urinary Tract: Adrenal glands are unremarkable. Kidneys are normal, without  renal calculi, focal lesion, or hydronephrosis. Bladder is unremarkable. Stomach/Bowel: The stomach and appendix appear normal. There is no evidence of bowel obstruction. Sigmoid diverticulitis is noted with 3.7 x 2.8 cm abscess or contained perforation. Vascular/Lymphatic: No significant vascular findings are present. No enlarged abdominal or pelvic lymph nodes. Reproductive: Prostate is unremarkable. Other: No abdominal wall hernia or abnormality. No abdominopelvic ascites. Musculoskeletal: No acute or significant osseous findings. IMPRESSION: Acute  sigmoid diverticulitis is noted. There is 3.7 x 2.8 cm adjacent fluid collection concerning for abscess or contained perforation. These results will be called to the ordering clinician or representative by the Radiologist Assistant, and communication documented in the PACS or zVision Dashboard. Electronically Signed   By: Marijo Conception, M.D.   On: 07/11/2017 15:32   Ct Image Guided Drainage By Percutaneous Catheter  Result Date: 07/12/2017 CLINICAL DATA:  Diverticular abscess EXAM: CT GUIDED DRAINAGE OF RIGHT PELVIC ABSCESS ANESTHESIA/SEDATION: Intravenous Fentanyl and Versed were administered as conscious sedation during continuous monitoring of the patient's level of consciousness and physiological / cardiorespiratory status by the radiology RN, with a total moderate sedation time of 13 minutes. PROCEDURE: The procedure, risks, benefits, and alternatives were explained to the patient. Questions regarding the procedure were encouraged and answered. The patient understands and consents to the procedure. Select axial scans through the lower abdomen and pelvis obtained. The collection was localized and an appropriate skin entry site was determined and marked. The operative field was prepped with chlorhexidinein a sterile fashion, and a sterile drape was applied covering the operative field. A sterile gown and sterile gloves were used for the procedure. Local anesthesia was provided with 1% Lidocaine. Under CT fluoroscopic guidance, an 18 gauge trocar needle was advanced into the collection. Viscous green opaque fluid could be aspirated. An Amplatz guidewire advanced easily within the collection, its position confirmed on CT. Tract dilated to facilitate placement of a 12 French pigtail drain catheter, formed centrally within the collection. Approximately 10 mL of purulent material were aspirated sent for Gram stain and culture. The catheter was secured externally with 0 Prolene suture and StatLock, flushed, and  placed to gravity drain bag. The patient tolerated the procedure well. COMPLICATIONS: None immediate FINDINGS: The right lower quadrant/pelvic collection was again localized. An appropriate safe approach was identified. 12 French pigtail drain catheter placed. Sample of the aspirate sent for Gram stain and culture. IMPRESSION: 1. Technically successful CT-guided right pelvic abscess drain catheter placement. Electronically Signed   By: Lucrezia Europe M.D.   On: 07/12/2017 09:49    Anti-infectives: Anti-infectives (From admission, onward)   Start     Dose/Rate Route Frequency Ordered Stop   07/11/17 2100  cefTRIAXone (ROCEPHIN) 2 g in sodium chloride 0.9 % 100 mL IVPB     2 g 200 mL/hr over 30 Minutes Intravenous Every 24 hours 07/11/17 2003     07/11/17 1900  ciprofloxacin (CIPRO) IVPB 400 mg  Status:  Discontinued     400 mg 200 mL/hr over 60 Minutes Intravenous  Once 07/11/17 1847 07/11/17 2002   07/11/17 1900  metroNIDAZOLE (FLAGYL) IVPB 500 mg     500 mg 100 mL/hr over 60 Minutes Intravenous  Once 07/11/17 1847 07/11/17 2120       Assessment/Plan  1. Diverticulitis with contained perforation and abscess, s/p perc drain 3/8  -second episode of tics with first being in 7628 and uncomplicated. -cont NPO x ice today due to pain and inflammation -cont drain placement to hopefully avoid any type of  surgical intervention; however, he is aware that if he fails conservative management this is a possibility. -mobilize and pulm toilet  FEN - NPO x Ice VTE - SCDs, ? Start lovenox/heparin after IR procedure ID - Rocephin/Flagyl 3/7 -->  CAD HTN - per medicine   LOS: 1 day    Henreitta Cea , [facility] 07/12/2017, 10:23 AM Pager: [phone] Consults: [phone] Mon-Fri 7:00 am-4:30 pm Sat-Sun 7:00 am-11:30 am

## 2017-07-12 NOTE — Progress Notes (Signed)
PROGRESS NOTE  HESTER FORGET  EUM:[id number] DOB: 1966-03-13 DOA: 07/11/2017 PCP: Silverio Decamp, MD  Brief Narrative:   Vision is a 52 year old male with history of CAD status post CABG in 2009, diverticulitis who presented to the with abdominal pain.  He had had a 1.5-week history of right lower quadrant abdominal pain and pelvic pain and had been put on ciprofloxacin for presumed prostatitis about 4 days prior to admission.  His symptoms did not improve and he had an outpatient CT on 3/7 which demonstrated diverticulitis with a 3.7 x 2.8 cm sigmoid abscess.  He was referred for admission and underwent drain placement by interventional radiology on 3/8.  Assessment & Plan:  Diverticulitis of large intestine with 3.7 x 2.8 cm sigmoid abscess status post drain placement on 3/8 -Continue ceftriaxone and Flagyl -Continue IV fluids -Ice chips and sips -Appreciate general surgery and interventional radiology assistance  CAD, chest pain-free, resume aspirin tomorrow and continue statin  DVT prophylaxis: Start Lovenox tomorrow Code Status: Full code Family Communication: Patient, wife at bedside Disposition Plan: Home in a few days once diet advanced   Consultants:   General surgery  Interventional radiology  Procedures:  Drain placement in his sigmoid abscess on 3/8  Antimicrobials:  Anti-infectives (From admission, onward)   Start     Dose/Rate Route Frequency Ordered Stop   07/11/17 2100  cefTRIAXone (ROCEPHIN) 2 g in sodium chloride 0.9 % 100 mL IVPB     2 g 200 mL/hr over 30 Minutes Intravenous Every 24 hours 07/11/17 2003     07/11/17 1900  ciprofloxacin (CIPRO) IVPB 400 mg  Status:  Discontinued     400 mg 200 mL/hr over 60 Minutes Intravenous  Once 07/11/17 1847 07/11/17 2002   07/11/17 1900  metroNIDAZOLE (FLAGYL) IVPB 500 mg     500 mg 100 mL/hr over 60 Minutes Intravenous  Once 07/11/17 1847 07/11/17 2120       Subjective: Feeling better since he had  his drain placed although he has some incisional pain.  He denies current nausea or vomiting and feels hungry.  No bowel movements since Monday.  Objective: Vitals:   07/12/17 0910 07/12/17 0915 07/12/17 0920 07/12/17 1320  BP: 99/68 109/65 102/69 (!) 99/51  Pulse: 71 76 70 70  Resp: 16 13 12 16   Temp:    98.2 F (36.8 C)  TempSrc:    Oral  SpO2: 98% 95% 99% 94%  Weight:      Height:        Intake/Output Summary (Last 24 hours) at 07/12/2017 1648 Last data filed at 07/12/2017 0920 Gross per 24 hour  Intake 1062.5 ml  Output 388 ml  Net 674.5 ml   Filed Weights   07/11/17 2103  Weight: 101.4 kg (223 lb 8.7 oz)    Examination:  General exam:  Adult male.  No acute distress.  HEENT:  NCAT, MMM Respiratory system: Clear to auscultation bilaterally Cardiovascular system: Regular rate and rhythm, normal S1/S2. No murmurs, rubs, gallops or clicks.  Warm extremities Gastrointestinal system: Hypoactive bowel sounds, soft, mildly distended, tender to palpation in the right lower quadrant without rebound or guarding.  His drain has a scant amount of serosanguineous material and some clotted blood in the line. MSK:  Normal tone and bulk, no lower extremity edema Neuro:  Grossly intact    Data Reviewed: I have personally reviewed following labs and imaging studies  CBC: Recent Labs  Lab 07/08/17 1515 07/11/17 1155 07/12/17 5400  WBC 14.6* 13.0* 12.5*  NEUTROABS  --  8,918*  --   HGB 14.2 16.0 13.0  HCT 40.1 46.4 38.9*  MCV 82.3 83.3 85.9  PLT 355 360 631   Basic Metabolic Panel: Recent Labs  Lab 07/08/17 1515 07/11/17 1155 07/11/17 1900 07/12/17 0609  NA 138 139 138 139  K 4.0 4.2 3.9 3.9  CL 104 105 102 107  CO2 22 23 25 22   GLUCOSE 84 95 88 92  BUN 11 10 8 9   CREATININE 0.84 1.00 1.01 1.01  CALCIUM 9.5 9.6 9.4 8.8*   GFR: Estimated Creatinine Clearance: 109.2 mL/min (by C-G formula based on SCr of 1.01 mg/dL). Liver Function Tests: Recent Labs  Lab  07/08/17 1515 07/11/17 1155 07/11/17 1900  AST 19 22 28   ALT 33 30 33  ALKPHOS  --   --  71  BILITOT 0.4 0.4 0.6  PROT 7.4 7.7 7.4  ALBUMIN  --   --  3.8   No results for input(s): LIPASE, AMYLASE in the last 168 hours. No results for input(s): AMMONIA in the last 168 hours. Coagulation Profile: Recent Labs  Lab 07/12/17 0737  INR 1.19   Cardiac Enzymes: No results for input(s): CKTOTAL, CKMB, CKMBINDEX, TROPONINI in the last 168 hours. BNP (last 3 results) No results for input(s): PROBNP in the last 8760 hours. HbA1C: No results for input(s): HGBA1C in the last 72 hours. CBG: No results for input(s): GLUCAP in the last 168 hours. Lipid Profile: No results for input(s): CHOL, HDL, LDLCALC, TRIG, CHOLHDL, LDLDIRECT in the last 72 hours. Thyroid Function Tests: No results for input(s): TSH, T4TOTAL, FREET4, T3FREE, THYROIDAB in the last 72 hours. Anemia Panel: No results for input(s): VITAMINB12, FOLATE, FERRITIN, TIBC, IRON, RETICCTPCT in the last 72 hours. Urine analysis:    Component Value Date/Time   COLORURINE STRAW (A) 07/11/2017 1831   APPEARANCEUR CLEAR 07/11/2017 1831   LABSPEC >1.046 (H) 07/11/2017 1831   PHURINE 5.0 07/11/2017 1831   GLUCOSEU NEGATIVE 07/11/2017 1831   HGBUR NEGATIVE 07/11/2017 1831   BILIRUBINUR NEGATIVE 07/11/2017 1831   BILIRUBINUR negative 07/08/2017 1447   KETONESUR NEGATIVE 07/11/2017 1831   PROTEINUR NEGATIVE 07/11/2017 1831   UROBILINOGEN 0.2 07/08/2017 1447   UROBILINOGEN 1.0 01/08/2008 1143   NITRITE NEGATIVE 07/11/2017 1831   LEUKOCYTESUR NEGATIVE 07/11/2017 1831   Sepsis Labs: @LABRCNTIP (procalcitonin:4,lacticidven:4)  ) Recent Results (from the past 240 hour(s))  Urine Culture     Status: None   Collection Time: 07/08/17  2:59 PM  Result Value Ref Range Status   MICRO NUMBER: [id number]  Final   SPECIMEN QUALITY: ADEQUATE  Final   Sample Source URINE  Final   STATUS: FINAL  Final   Result: No Growth  Final  Culture,  blood (Routine x 2)     Status: None (Preliminary result)   Collection Time: 07/11/17  6:50 PM  Result Value Ref Range Status   Specimen Description BLOOD RIGHT ANTECUBITAL  Final   Special Requests   Final    BOTTLES DRAWN AEROBIC AND ANAEROBIC Blood Culture adequate volume   Culture   Final    NO GROWTH < 24 HOURS Performed at [facility], [place] [address], [place], [place] [address]    Report Status PENDING  Incomplete  Culture, blood (Routine x 2)     Status: None (Preliminary result)   Collection Time: 07/11/17  7:00 PM  Result Value Ref Range Status   Specimen Description BLOOD RIGHT HAND  Final  Special Requests IN PEDIATRIC BOTTLE Blood Culture adequate volume  Final   Culture   Final    NO GROWTH < 24 HOURS Performed at [facility], [place] [address] [place], [place] [address]    Report Status PENDING  Incomplete  Body fluid culture     Status: None (Preliminary result)   Collection Time: 07/12/17 10:29 AM  Result Value Ref Range Status   Specimen Description ABDOMEN  Final   Special Requests NONE  Final   Gram Stain   Final    ABUNDANT WBC PRESENT, PREDOMINANTLY PMN MODERATE GRAM POSITIVE COCCI IN PAIRS AND CHAINS RARE GRAM NEGATIVE RODS Performed at [facility], [place] [address], [place],  [address]    Culture PENDING  Incomplete   Report Status PENDING  Incomplete      Radiology Studies: Dg Chest 2 View  Result Date: 07/11/2017 CLINICAL DATA:  Sepsis. The patient had a CT in [place], and was told to come straight to the hospital. History of CAD, hypertension, MI. EXAM: CHEST - 2 VIEW COMPARISON:  10/19/2016 FINDINGS: Status post median sternotomy and CABG. The heart is normal in size. No focal consolidations or pleural effusions. No pulmonary edema. Contrast is identified within the colon. IMPRESSION: No active cardiopulmonary disease. Electronically Signed   By: Nolon Nations M.D.   On: 07/11/2017 20:14   Ct Abdomen Pelvis W  Contrast  Result Date: 07/11/2017 CLINICAL DATA:  Lower abdominal pain for 3 weeks. EXAM: CT ABDOMEN AND PELVIS WITH CONTRAST TECHNIQUE: Multidetector CT imaging of the abdomen and pelvis was performed using the standard protocol following bolus administration of intravenous contrast. CONTRAST:  151mL ISOVUE-300 IOPAMIDOL (ISOVUE-300) INJECTION 61% COMPARISON:  None. FINDINGS: Lower chest: No acute abnormality. Hepatobiliary: No focal liver abnormality is seen. No gallstones, gallbladder wall thickening, or biliary dilatation. Pancreas: Unremarkable. No pancreatic ductal dilatation or surrounding inflammatory changes. Spleen: Normal in size without focal abnormality. Adrenals/Urinary Tract: Adrenal glands are unremarkable. Kidneys are normal, without renal calculi, focal lesion, or hydronephrosis. Bladder is unremarkable. Stomach/Bowel: The stomach and appendix appear normal. There is no evidence of bowel obstruction. Sigmoid diverticulitis is noted with 3.7 x 2.8 cm abscess or contained perforation. Vascular/Lymphatic: No significant vascular findings are present. No enlarged abdominal or pelvic lymph nodes. Reproductive: Prostate is unremarkable. Other: No abdominal wall hernia or abnormality. No abdominopelvic ascites. Musculoskeletal: No acute or significant osseous findings. IMPRESSION: Acute sigmoid diverticulitis is noted. There is 3.7 x 2.8 cm adjacent fluid collection concerning for abscess or contained perforation. These results will be called to the ordering clinician or representative by the Radiologist Assistant, and communication documented in the PACS or zVision Dashboard. Electronically Signed   By: Marijo Conception, M.D.   On: 07/11/2017 15:32   Ct Image Guided Drainage By Percutaneous Catheter  Result Date: 07/12/2017 CLINICAL DATA:  Diverticular abscess EXAM: CT GUIDED DRAINAGE OF RIGHT PELVIC ABSCESS ANESTHESIA/SEDATION: Intravenous Fentanyl and Versed were administered as conscious sedation  during continuous monitoring of the patient's level of consciousness and physiological / cardiorespiratory status by the radiology RN, with a total moderate sedation time of 13 minutes. PROCEDURE: The procedure, risks, benefits, and alternatives were explained to the patient. Questions regarding the procedure were encouraged and answered. The patient understands and consents to the procedure. Select axial scans through the lower abdomen and pelvis obtained. The collection was localized and an appropriate skin entry site was determined and marked. The operative field was prepped with chlorhexidinein a sterile fashion, and  a sterile drape was applied covering the operative field. A sterile gown and sterile gloves were used for the procedure. Local anesthesia was provided with 1% Lidocaine. Under CT fluoroscopic guidance, an 18 gauge trocar needle was advanced into the collection. Viscous green opaque fluid could be aspirated. An Amplatz guidewire advanced easily within the collection, its position confirmed on CT. Tract dilated to facilitate placement of a 12 French pigtail drain catheter, formed centrally within the collection. Approximately 10 mL of purulent material were aspirated sent for Gram stain and culture. The catheter was secured externally with 0 Prolene suture and StatLock, flushed, and placed to gravity drain bag. The patient tolerated the procedure well. COMPLICATIONS: None immediate FINDINGS: The right lower quadrant/pelvic collection was again localized. An appropriate safe approach was identified. 12 French pigtail drain catheter placed. Sample of the aspirate sent for Gram stain and culture. IMPRESSION: 1. Technically successful CT-guided right pelvic abscess drain catheter placement. Electronically Signed   By: Lucrezia Europe M.D.   On: 07/12/2017 09:49     Scheduled Meds: . fentaNYL      . lidocaine      . loratadine  10 mg Oral Daily  . midazolam      . montelukast  10 mg Oral QHS  .  multivitamin with minerals  1 tablet Oral Daily  . rosuvastatin  40 mg Oral Daily   Continuous Infusions: . sodium chloride 125 mL/hr at 07/12/17 1115  . cefTRIAXone (ROCEPHIN)  IV Stopped (07/11/17 2217)     LOS: 1 day    Time spent: 30 min    Janece Canterbury, MD [facility] Pager [phone]  If 7PM-7AM, please contact night-coverage [email or web address] Password [facility] 07/12/2017, 4:48 PM

## 2017-07-12 NOTE — Consult Note (Signed)
Chief Complaint: Patient was seen in consultation today for intra-abdominal abscess  Referring Physician(s): Dr. Greer Pickerel  Supervising Physician: Arne Cleveland  Patient Status: [facility] - In-pt  History of Present Illness: [name] is a 52 y.o. male with past medical history of CAD, MI x2, diverticulitis, HLD, and sleep apnea presented to [facility] ED with abdominal pain.   CT Abdomen Pelvis 07/11/17 showed: Acute sigmoid diverticulitis is noted. There is 3.7 x 2.8 cm adjacent fluid collection concerning for abscess or contained perforation. These results will be called to the ordering clinician or representative by the Radiologist Assistant, and communication documented in the PACS or zVision Dashboard.  IR consulted for aspiration and drainage at the request of Dr. Redmond Pulling.  Case reviewed and approved by Dr. Vernard Gambles.    He has been NPO.  He does not take blood thinners.   Past Medical History:  Diagnosis Date  . CAD (coronary artery disease) of artery bypass graft 2009   Status post bypass grafting 2009 , status post stress echo 2012 no ischemia.  . Dermatitis    Left upper extremity  . Diverticulitis   . Erectile dysfunction   . Hx of adenomatous colonic polyps 09/02/2014  . Hyperlipidemia, mixed   . Hypotension, unspecified   . Myocardial infarction ([place])    per pt had 2 MI  . Sleep apnea    On 8 cm of water CPAP    Past Surgical History:  Procedure Laterality Date  . CORONARY ARTERY BYPASS GRAFT  2009    Allergies: Penicillins and Sulfonamide derivatives  Medications: Prior to Admission medications   Medication Sig Start Date End Date Taking? Authorizing Provider  aspirin 81 MG tablet Take 81 mg by mouth daily.     Yes [provider]  Cholecalciferol (VITAMIN D PO) Take by mouth.   Yes [provider]  ipratropium (ATROVENT) 0.06 % nasal spray Place 2 sprays into both nostrils every 4 (four) hours as needed for rhinitis. 04/23/16   Yes Gregor Hams, MD  levocetirizine (XYZAL) 5 MG tablet Take 1 tablet (5 mg total) by mouth every evening. 08/14/16  Yes Silverio Decamp, MD  montelukast (SINGULAIR) 10 MG tablet Take 1 tablet (10 mg total) by mouth at bedtime. 05/06/17  Yes Silverio Decamp, MD  Multiple Vitamin (MULTIVITAMIN) tablet Take 1 tablet by mouth daily.   Yes [provider]  Omega-3 Fatty Acids (FISH OIL) 1200 MG CAPS Take 1 capsule by mouth daily.   Yes [provider]  ondansetron (ZOFRAN) 8 MG tablet Take 1 tablet (8 mg total) by mouth every 8 (eight) hours as needed for nausea or vomiting. 07/08/17  Yes Gregor Hams, MD  rosuvastatin (CRESTOR) 40 MG tablet Take 1 tablet (40 mg total) by mouth daily. 05/06/17  Yes Branch, Alphonse Guild, MD  lidocaine (XYLOCAINE) 2 % solution Use as directed 10 mLs in the mouth or throat every 3 (three) hours as needed (throat pain). 01/17/17   Trixie Dredge, PA-C     Family History  Problem Relation Age of Onset  . Heart disease Mother   . Heart disease Father   . Colon cancer Paternal Uncle   . Kidney disease Other   . Coronary artery disease Other   . Heart failure Other   . Esophageal cancer Neg Hx   . Prostate cancer Neg Hx   . Rectal cancer Neg Hx   . Stomach cancer Neg Hx     Social History  Socioeconomic History  . Marital status: Married    Spouse name: None  . Number of children: None  . Years of education: None  . Highest education level: None  Social Needs  . Financial resource strain: None  . Food insecurity - worry: None  . Food insecurity - inability: None  . Transportation needs - medical: None  . Transportation needs - non-medical: None  Occupational History  . Occupation: Full time    Employer: [employer]  Tobacco Use  . Smoking status: Former Smoker    Packs/day: 0.20    Years: 5.00    Pack years: 1.00    Types: Cigarettes, Cigars    Start date: 05/28/2000    Last attempt to quit: 05/07/2005     Years since quitting: 12.1  . Smokeless tobacco: Former Systems developer    Types: Chew    Quit date: 05/07/1997  . Tobacco comment: chewed tobacco x 20 yrs - quit 2000  Substance and Sexual Activity  . Alcohol use: Yes    Alcohol/week: 9.0 oz    Types: 5 Standard drinks or equivalent, 10 Shots of liquor per week    Comment: beer & liquor  . Drug use: No  . Sexual activity: Yes    Partners: Female  Other Topics Concern  . None  Social History Narrative  . None     Review of Systems: A 12 point ROS discussed and pertinent positives are indicated in the HPI above.  All other systems are negative.  Review of Systems  Constitutional: Negative for fatigue and fever.  Respiratory: Negative for cough and shortness of breath.   Cardiovascular: Negative for chest pain.  Gastrointestinal: Positive for abdominal pain.  Psychiatric/Behavioral: Negative for behavioral problems and confusion.    Vital Signs: BP (!) 99/57 (BP Location: Right Arm)   Pulse 68   Temp 98.4 F (36.9 C) (Oral)   Resp 18   Ht 6\' 3"  (1.905 m)   Wt 223 lb 8.7 oz (101.4 kg)   SpO2 98%   BMI 27.94 kg/m   Physical Exam  Constitutional: He is oriented to person, place, and time. He appears well-developed.  Cardiovascular: Normal rate, regular rhythm and normal heart sounds.  Pulmonary/Chest: Effort normal and breath sounds normal. No respiratory distress.  Abdominal: Soft. Normal appearance. There is tenderness (lower quadrant).  Neurological: He is alert and oriented to person, place, and time.  Skin: Skin is warm and dry.  Psychiatric: He has a normal mood and affect. His behavior is normal.  Nursing note and vitals reviewed.    MD Evaluation Airway: WNL Heart: WNL Abdomen: WNL Chest/ Lungs: WNL ASA  Classification: 3 Mallampati/Airway Score: Two   Imaging: Dg Chest 2 View  Result Date: 07/11/2017 CLINICAL DATA:  Sepsis. The patient had a CT in [place], and was told to come straight to the hospital.  History of CAD, hypertension, MI. EXAM: CHEST - 2 VIEW COMPARISON:  10/19/2016 FINDINGS: Status post median sternotomy and CABG. The heart is normal in size. No focal consolidations or pleural effusions. No pulmonary edema. Contrast is identified within the colon. IMPRESSION: No active cardiopulmonary disease. Electronically Signed   By: Nolon Nations M.D.   On: 07/11/2017 20:14   Ct Abdomen Pelvis W Contrast  Result Date: 07/11/2017 CLINICAL DATA:  Lower abdominal pain for 3 weeks. EXAM: CT ABDOMEN AND PELVIS WITH CONTRAST TECHNIQUE: Multidetector CT imaging of the abdomen and pelvis was performed using the standard protocol following bolus administration of intravenous contrast.  CONTRAST:  148mL ISOVUE-300 IOPAMIDOL (ISOVUE-300) INJECTION 61% COMPARISON:  None. FINDINGS: Lower chest: No acute abnormality. Hepatobiliary: No focal liver abnormality is seen. No gallstones, gallbladder wall thickening, or biliary dilatation. Pancreas: Unremarkable. No pancreatic ductal dilatation or surrounding inflammatory changes. Spleen: Normal in size without focal abnormality. Adrenals/Urinary Tract: Adrenal glands are unremarkable. Kidneys are normal, without renal calculi, focal lesion, or hydronephrosis. Bladder is unremarkable. Stomach/Bowel: The stomach and appendix appear normal. There is no evidence of bowel obstruction. Sigmoid diverticulitis is noted with 3.7 x 2.8 cm abscess or contained perforation. Vascular/Lymphatic: No significant vascular findings are present. No enlarged abdominal or pelvic lymph nodes. Reproductive: Prostate is unremarkable. Other: No abdominal wall hernia or abnormality. No abdominopelvic ascites. Musculoskeletal: No acute or significant osseous findings. IMPRESSION: Acute sigmoid diverticulitis is noted. There is 3.7 x 2.8 cm adjacent fluid collection concerning for abscess or contained perforation. These results will be called to the ordering clinician or representative by the  Radiologist Assistant, and communication documented in the PACS or zVision Dashboard. Electronically Signed   By: Marijo Conception, M.D.   On: 07/11/2017 15:32    Labs:  CBC: Recent Labs    04/02/17 1521 07/08/17 1515 07/11/17 1155 07/12/17 0609  WBC 11.1* 14.6* 13.0* 12.5*  HGB 15.8 14.2 16.0 13.0  HCT 45.2 40.1 46.4 38.9*  PLT 313 355 360 314    COAGS: Recent Labs    07/12/17 0737  INR 1.19    BMP: Recent Labs    07/08/17 1515 07/11/17 1155 07/11/17 1900 07/12/17 0609  NA 138 139 138 139  K 4.0 4.2 3.9 3.9  CL 104 105 102 107  CO2 22 23 25 22   GLUCOSE 84 95 88 92  BUN 11 10 8 9   CALCIUM 9.5 9.6 9.4 8.8*  CREATININE 0.84 1.00 1.01 1.01  GFRNONAA 100 86 >60 >60  GFRAA 116 99 >60 >60    LIVER FUNCTION TESTS: Recent Labs    04/02/17 1521 07/08/17 1515 07/11/17 1155 07/11/17 1900  BILITOT 0.6 0.4 0.4 0.6  AST 31 19 22 28   ALT 38 33 30 33  ALKPHOS  --   --   --  71  PROT 7.7 7.4 7.7 7.4  ALBUMIN  --   --   --  3.8    TUMOR MARKERS: No results for input(s): AFPTM, CEA, CA199, CHROMGRNA in the last 8760 hours.  Assessment and Plan: Intra-abdominal abscess Patient presents with abdominal pain and pressure.  CT Abdomen shows diverticular abscess. IR consulted for aspiration and drainage at the request of Dr. Redmond Pulling.  Case reviewed and approved by Dr. Vernard Gambles.  Patient has been NPO.  He does not take blood thinners.  Risks and benefits discussed with the patient including bleeding, infection, damage to adjacent structures, bowel perforation/fistula connection, and sepsis.  All of the patient's questions were answered, patient is agreeable to proceed. Consent signed and in chart.  Thank you for this interesting consult.  I greatly enjoyed meeting TEJA JUDICE and look forward to participating in their care.  A copy of this report was sent to the requesting provider on this date.  Electronically Signed: Docia Barrier, PA 07/12/2017, 8:50  AM   I spent a total of 40 Minutes    in face to face in clinical consultation, greater than 50% of which was counseling/coordinating care for intra-abdominal abscess.

## 2017-07-13 ENCOUNTER — Encounter (HOSPITAL_COMMUNITY): Payer: Self-pay | Admitting: General Surgery

## 2017-07-13 LAB — BASIC METABOLIC PANEL
ANION GAP: 12 (ref 5–15)
BUN: 8 mg/dL (ref 6–20)
CHLORIDE: 102 mmol/L (ref 101–111)
CO2: 21 mmol/L — AB (ref 22–32)
Calcium: 8.8 mg/dL — ABNORMAL LOW (ref 8.9–10.3)
Creatinine, Ser: 0.85 mg/dL (ref 0.61–1.24)
GFR calc Af Amer: >60 mL/min (ref >60)
GFR calc non Af Amer: >60 mL/min (ref >60)
GLUCOSE: 104 mg/dL — AB (ref 65–99)
POTASSIUM: 3.9 mmol/L (ref 3.5–5.1)
Sodium: 135 mmol/L (ref 135–145)

## 2017-07-13 LAB — CBC
HEMATOCRIT: 40.5 % (ref 39.0–52.0)
HEMOGLOBIN: 13.8 g/dL (ref 13.0–17.0)
MCH: 29.1 pg (ref 26.0–34.0)
MCHC: 34.1 g/dL (ref 30.0–36.0)
MCV: 85.3 fL (ref 78.0–100.0)
Platelets: 300 10*3/uL (ref 150–400)
RBC: 4.75 MIL/uL (ref 4.22–5.81)
RDW: 12.1 % (ref 11.5–15.5)
WBC: 12.8 10*3/uL — ABNORMAL HIGH (ref 4.0–10.5)

## 2017-07-13 MED ORDER — METRONIDAZOLE IN NACL 5-0.79 MG/ML-% IV SOLN
500.0000 mg | Freq: Three times a day (TID) | INTRAVENOUS | Status: DC
  Administered 2017-07-13 – 2017-07-17 (×13): 500 mg via INTRAVENOUS
  Filled 2017-07-13 (×14): qty 100

## 2017-07-13 MED ORDER — ASPIRIN EC 81 MG PO TBEC
81.0000 mg | DELAYED_RELEASE_TABLET | Freq: Every day | ORAL | Status: DC
  Administered 2017-07-14 – 2017-07-17 (×4): 81 mg via ORAL
  Filled 2017-07-13 (×4): qty 1

## 2017-07-13 NOTE — Progress Notes (Signed)
PROGRESS NOTE  [name]  XVQ:[id number] DOB: 08-Nov-1965 DOA: 07/11/2017 PCP: Silverio Decamp, MD  Brief Narrative:   Vision is a 52 year old male with history of CAD status post CABG in 2009, diverticulitis who presented to the with abdominal pain.  He had had a 1.5-week history of right lower quadrant abdominal pain and pelvic pain and had been put on ciprofloxacin for presumed prostatitis about 4 days prior to admission.  His symptoms did not improve and he had an outpatient CT on 3/7 which demonstrated diverticulitis with a 3.7 x 2.8 cm sigmoid abscess.  He was referred for admission and underwent drain placement by interventional radiology on 3/8.  Assessment & Plan:  Diverticulitis of large intestine with 3.7 x 2.8 cm sigmoid abscess status post drain placement on 3/8 -Continue ceftriaxone and Flagyl -Continue IV fluids - Advance to CLD -Appreciate general surgery and interventional radiology assistance  CAD, chest pain-free, resume aspirin tomorrow and continue statin  DVT prophylaxis: Lovenox Code Status: Full code Family Communication: Patient, wife at bedside Disposition Plan: Home in a few days once diet advanced   Consultants:   General surgery  Interventional radiology  Procedures:  Drain placement in his sigmoid abscess on 3/8  Antimicrobials:  Anti-infectives (From admission, onward)   Start     Dose/Rate Route Frequency Ordered Stop   07/13/17 0645  metroNIDAZOLE (FLAGYL) IVPB 500 mg     500 mg 100 mL/hr over 60 Minutes Intravenous Every 8 hours 07/13/17 0639     07/11/17 2100  cefTRIAXone (ROCEPHIN) 2 g in sodium chloride 0.9 % 100 mL IVPB     2 g 200 mL/hr over 30 Minutes Intravenous Every 24 hours 07/11/17 2003     07/11/17 1900  ciprofloxacin (CIPRO) IVPB 400 mg  Status:  Discontinued     400 mg 200 mL/hr over 60 Minutes Intravenous  Once 07/11/17 1847 07/11/17 2002   07/11/17 1900  metroNIDAZOLE (FLAGYL) IVPB 500 mg     500 mg 100  mL/hr over 60 Minutes Intravenous  Once 07/11/17 1847 07/11/17 2120       Subjective:  Abdominal pain has improved.  He was able to ambulate in the hall [name] distance and had much less pain in his right lower quadrant.  He denies fevers, chills, nausea.  He is passed flatus several times.  He denies any bowel movements.  Objective: Vitals:   07/12/17 1320 07/12/17 2105 07/13/17 0602 07/13/17 1300  BP: (!) 99/51 113/63 107/65 119/63  Pulse: 70 80 79 84  Resp: 16 19 19 18   Temp: 98.2 F (36.8 C) 98.4 F (36.9 C) 98.4 F (36.9 C) 99.4 F (37.4 C)  TempSrc: Oral Oral Oral Oral  SpO2: 94% 97% 98% 94%  Weight:      Height:        Intake/Output Summary (Last 24 hours) at 07/13/2017 1656 Last data filed at 07/13/2017 1500 Gross per 24 hour  Intake 3698.33 ml  Output 2510 ml  Net 1188.33 ml   Filed Weights   07/11/17 2103  Weight: 101.4 kg (223 lb 8.7 oz)    Examination:  General exam:  Adult male.  No acute distress.  HEENT:  NCAT, MMM Respiratory system: Clear to auscultation bilaterally Cardiovascular system: Regular rate and rhythm, normal S1/S2. No murmurs, rubs, gallops or clicks.  Warm extremities Gastrointestinal system: Normal active bowel sounds, soft, nondistended, tender to palpation in the right lower quadrant and just to the right of the suprapubic area.  His  drain has thick brown sludge/pus in it.   MSK:  Normal tone and bulk, no lower extremity edema Neuro:  Grossly intact    Data Reviewed: I have personally reviewed following labs and imaging studies  CBC: Recent Labs  Lab 07/08/17 1515 07/11/17 1155 07/12/17 0609 07/13/17 0357  WBC 14.6* 13.0* 12.5* 12.8*  NEUTROABS  --  8,918*  --   --   HGB 14.2 16.0 13.0 13.8  HCT 40.1 46.4 38.9* 40.5  MCV 82.3 83.3 85.9 85.3  PLT 355 360 314 626   Basic Metabolic Panel: Recent Labs  Lab 07/08/17 1515 07/11/17 1155 07/11/17 1900 07/12/17 0609 07/13/17 0357  NA 138 139 138 139 135  K 4.0 4.2 3.9 3.9  3.9  CL 104 105 102 107 102  CO2 22 23 25 22  21*  GLUCOSE 84 95 88 92 104*  BUN 11 10 8 9 8   CREATININE 0.84 1.00 1.01 1.01 0.85  CALCIUM 9.5 9.6 9.4 8.8* 8.8*   GFR: Estimated Creatinine Clearance: 129.8 mL/min (by C-G formula based on SCr of 0.85 mg/dL). Liver Function Tests: Recent Labs  Lab 07/08/17 1515 07/11/17 1155 07/11/17 1900  AST 19 22 28   ALT 33 30 33  ALKPHOS  --   --  71  BILITOT 0.4 0.4 0.6  PROT 7.4 7.7 7.4  ALBUMIN  --   --  3.8   No results for input(s): LIPASE, AMYLASE in the last 168 hours. No results for input(s): AMMONIA in the last 168 hours. Coagulation Profile: Recent Labs  Lab 07/12/17 0737  INR 1.19   Cardiac Enzymes: No results for input(s): CKTOTAL, CKMB, CKMBINDEX, TROPONINI in the last 168 hours. BNP (last 3 results) No results for input(s): PROBNP in the last 8760 hours. HbA1C: No results for input(s): HGBA1C in the last 72 hours. CBG: No results for input(s): GLUCAP in the last 168 hours. Lipid Profile: No results for input(s): CHOL, HDL, LDLCALC, TRIG, CHOLHDL, LDLDIRECT in the last 72 hours. Thyroid Function Tests: No results for input(s): TSH, T4TOTAL, FREET4, T3FREE, THYROIDAB in the last 72 hours. Anemia Panel: No results for input(s): VITAMINB12, FOLATE, FERRITIN, TIBC, IRON, RETICCTPCT in the last 72 hours. Urine analysis:    Component Value Date/Time   COLORURINE STRAW (A) 07/11/2017 1831   APPEARANCEUR CLEAR 07/11/2017 1831   LABSPEC >1.046 (H) 07/11/2017 1831   PHURINE 5.0 07/11/2017 1831   GLUCOSEU NEGATIVE 07/11/2017 1831   HGBUR NEGATIVE 07/11/2017 1831   BILIRUBINUR NEGATIVE 07/11/2017 1831   BILIRUBINUR negative 07/08/2017 1447   KETONESUR NEGATIVE 07/11/2017 1831   PROTEINUR NEGATIVE 07/11/2017 1831   UROBILINOGEN 0.2 07/08/2017 1447   UROBILINOGEN 1.0 01/08/2008 1143   NITRITE NEGATIVE 07/11/2017 1831   LEUKOCYTESUR NEGATIVE 07/11/2017 1831   Sepsis  Labs: @LABRCNTIP (procalcitonin:4,lacticidven:4)  ) Recent Results (from the past 240 hour(s))  Urine Culture     Status: None   Collection Time: 07/08/17  2:59 PM  Result Value Ref Range Status   MICRO NUMBER: [id number]  Final   SPECIMEN QUALITY: ADEQUATE  Final   Sample Source URINE  Final   STATUS: FINAL  Final   Result: No Growth  Final  Culture, blood (Routine x 2)     Status: None (Preliminary result)   Collection Time: 07/11/17  6:50 PM  Result Value Ref Range Status   Specimen Description BLOOD RIGHT ANTECUBITAL  Final   Special Requests   Final    BOTTLES DRAWN AEROBIC AND ANAEROBIC Blood Culture adequate volume   Culture  Final    NO GROWTH 2 DAYS Performed at [facility], [place] [address], [place], [place] [address]    Report Status PENDING  Incomplete  Culture, blood (Routine x 2)     Status: None (Preliminary result)   Collection Time: 07/11/17  7:00 PM  Result Value Ref Range Status   Specimen Description BLOOD RIGHT HAND  Final   Special Requests IN PEDIATRIC BOTTLE Blood Culture adequate volume  Final   Culture   Final    NO GROWTH 2 DAYS Performed at [facility], [place] [address] [place], [place] [address]    Report Status PENDING  Incomplete  Body fluid culture     Status: None (Preliminary result)   Collection Time: 07/12/17 10:29 AM  Result Value Ref Range Status   Specimen Description ABDOMEN  Final   Special Requests NONE  Final   Gram Stain   Final    ABUNDANT WBC PRESENT, PREDOMINANTLY PMN MODERATE GRAM POSITIVE COCCI IN PAIRS AND CHAINS RARE GRAM NEGATIVE RODS    Culture   Final    CULTURE REINCUBATED FOR BETTER GROWTH Performed at [facility], [place] [address], [place],  [address]    Report Status PENDING  Incomplete      Radiology Studies: Dg Chest 2 View  Result Date: 07/11/2017 CLINICAL DATA:  Sepsis. The patient had a CT in [place], and was told to come straight to the hospital. History of CAD,  hypertension, MI. EXAM: CHEST - 2 VIEW COMPARISON:  10/19/2016 FINDINGS: Status post median sternotomy and CABG. The heart is normal in size. No focal consolidations or pleural effusions. No pulmonary edema. Contrast is identified within the colon. IMPRESSION: No active cardiopulmonary disease. Electronically Signed   By: Nolon Nations M.D.   On: 07/11/2017 20:14   Ct Image Guided Drainage By Percutaneous Catheter  Result Date: 07/12/2017 CLINICAL DATA:  Diverticular abscess EXAM: CT GUIDED DRAINAGE OF RIGHT PELVIC ABSCESS ANESTHESIA/SEDATION: Intravenous Fentanyl and Versed were administered as conscious sedation during continuous monitoring of the patient's level of consciousness and physiological / cardiorespiratory status by the radiology RN, with a total moderate sedation time of 13 minutes. PROCEDURE: The procedure, risks, benefits, and alternatives were explained to the patient. Questions regarding the procedure were encouraged and answered. The patient understands and consents to the procedure. Select axial scans through the lower abdomen and pelvis obtained. The collection was localized and an appropriate skin entry site was determined and marked. The operative field was prepped with chlorhexidinein a sterile fashion, and a sterile drape was applied covering the operative field. A sterile gown and sterile gloves were used for the procedure. Local anesthesia was provided with 1% Lidocaine. Under CT fluoroscopic guidance, an 18 gauge trocar needle was advanced into the collection. Viscous green opaque fluid could be aspirated. An Amplatz guidewire advanced easily within the collection, its position confirmed on CT. Tract dilated to facilitate placement of a 12 French pigtail drain catheter, formed centrally within the collection. Approximately 10 mL of purulent material were aspirated sent for Gram stain and culture. The catheter was secured externally with 0 Prolene suture and StatLock, flushed, and  placed to gravity drain bag. The patient tolerated the procedure well. COMPLICATIONS: None immediate FINDINGS: The right lower quadrant/pelvic collection was again localized. An appropriate safe approach was identified. 12 French pigtail drain catheter placed. Sample of the aspirate sent for Gram stain and culture. IMPRESSION: 1. Technically successful CT-guided right pelvic abscess drain catheter placement. Electronically  Signed   By: Lucrezia Europe M.D.   On: 07/12/2017 09:49     Scheduled Meds: . enoxaparin (LOVENOX) injection  40 mg Subcutaneous Q24H  . loratadine  10 mg Oral Daily  . montelukast  10 mg Oral QHS  . multivitamin with minerals  1 tablet Oral Daily  . rosuvastatin  40 mg Oral Daily   Continuous Infusions: . cefTRIAXone (ROCEPHIN)  IV Stopped (07/12/17 2048)  . dextrose 5 % and 0.45 % NaCl with KCl 20 mEq/L 75 mL/hr at 07/13/17 0336  . metronidazole Stopped (07/13/17 1451)     LOS: 2 days    Time spent: 30 min    Janece Canterbury, MD [facility] Pager [phone]  If 7PM-7AM, please contact night-coverage [email or web address] Password [facility] 07/13/2017, 4:56 PM

## 2017-07-13 NOTE — Progress Notes (Signed)
Subjective: Stable following IR drainage pelvic abscess yesterday. More pain at drain site but less lower abdominal diffuse pain No stool or flatus.  Denies nausea.  Thirsty.  WBC 12,800.  BUN 8.  Potassium 3.9 Objective: Vital signs in last 24 hours: Temp:  [98.2 F (36.8 C)-98.4 F (36.9 C)] 98.4 F (36.9 C) (03/09 0602) Pulse Rate:  [70-80] 79 (03/09 0602) Resp:  [12-19] 19 (03/09 0602) BP: (99-113)/(51-69) 107/65 (03/09 0602) SpO2:  [94 %-99 %] 98 % (03/09 0602) Last BM Date: 07/08/17  Intake/Output from previous day: 03/08 0701 - 03/09 0700 In: 2163.3 [I.V.:2063.3; IV Piggyback:100] Out: 1793 [Urine:1755; Drains:38] Intake/Output this shift: Total I/O In: 421.3 [I.V.:321.3; IV Piggyback:100] Out: 730 [Urine:700; Drains:30]  General appearance: Alert.  Pleasant.  No apparent distress Resp: clear to auscultation bilaterally GI: Edwin soft.  Nondistended.  Active bowel sounds.  Drain right lower quadrant with purulent brown output.  Mild tenderness to palpate left lower quadrant.  More tender around drain site.  Upper abdomen soft and nontender no guarding.  No mass. Extremities: extremities normal, atraumatic, no cyanosis or edema  Lab Results:  Recent Labs    07/12/17 0609 07/13/17 0357  WBC 12.5* 12.8*  HGB 13.0 13.8  HCT 38.9* 40.5  PLT 314 300   BMET Recent Labs    07/12/17 0609 07/13/17 0357  NA 139 135  K 3.9 3.9  CL 107 102  CO2 22 21*  GLUCOSE 92 104*  BUN 9 8  CREATININE 1.01 0.85  CALCIUM 8.8* 8.8*   PT/INR Recent Labs    07/12/17 0737  LABPROT 15.0  INR 1.19   ABG No results for input(s): PHART, HCO3 in the last 72 hours.  Invalid input(s): PCO2, PO2  Studies/Results: Dg Chest 2 View  Result Date: 07/11/2017 CLINICAL DATA:  Sepsis. The patient had a CT in [place], and was told to come straight to the hospital. History of CAD, hypertension, MI. EXAM: CHEST - 2 VIEW COMPARISON:  10/19/2016 FINDINGS: Status post median  sternotomy and CABG. The heart is normal in size. No focal consolidations or pleural effusions. No pulmonary edema. Contrast is identified within the colon. IMPRESSION: No active cardiopulmonary disease. Electronically Signed   By: Nolon Nations M.D.   On: 07/11/2017 20:14   Ct Abdomen Pelvis W Contrast  Result Date: 07/11/2017 CLINICAL DATA:  Lower abdominal pain for 3 weeks. EXAM: CT ABDOMEN AND PELVIS WITH CONTRAST TECHNIQUE: Multidetector CT imaging of the abdomen and pelvis was performed using the standard protocol following bolus administration of intravenous contrast. CONTRAST:  166mL ISOVUE-300 IOPAMIDOL (ISOVUE-300) INJECTION 61% COMPARISON:  None. FINDINGS: Lower chest: No acute abnormality. Hepatobiliary: No focal liver abnormality is seen. No gallstones, gallbladder wall thickening, or biliary dilatation. Pancreas: Unremarkable. No pancreatic ductal dilatation or surrounding inflammatory changes. Spleen: Normal in size without focal abnormality. Adrenals/Urinary Tract: Adrenal glands are unremarkable. Kidneys are normal, without renal calculi, focal lesion, or hydronephrosis. Bladder is unremarkable. Stomach/Bowel: The stomach and appendix appear normal. There is no evidence of bowel obstruction. Sigmoid diverticulitis is noted with 3.7 x 2.8 cm abscess or contained perforation. Vascular/Lymphatic: No significant vascular findings are present. No enlarged abdominal or pelvic lymph nodes. Reproductive: Prostate is unremarkable. Other: No abdominal wall hernia or abnormality. No abdominopelvic ascites. Musculoskeletal: No acute or significant osseous findings. IMPRESSION: Acute sigmoid diverticulitis is noted. There is 3.7 x 2.8 cm adjacent fluid collection concerning for abscess or contained perforation. These results will be called to the ordering clinician or representative by the  Psychologist, clinical, and communication documented in the PACS or z[facility] Dashboard. Electronically Signed   By:  Marijo Conception, M.D.   On: 07/11/2017 15:32   Ct Image Guided Drainage By Percutaneous Catheter  Result Date: 07/12/2017 CLINICAL DATA:  Diverticular abscess EXAM: CT GUIDED DRAINAGE OF RIGHT PELVIC ABSCESS ANESTHESIA/SEDATION: Intravenous Fentanyl and Versed were administered as conscious sedation during continuous monitoring of the patient's level of consciousness and physiological / cardiorespiratory status by the radiology RN, with a total moderate sedation time of 13 minutes. PROCEDURE: The procedure, risks, benefits, and alternatives were explained to the patient. Questions regarding the procedure were encouraged and answered. The patient understands and consents to the procedure. Select axial scans through the lower abdomen and pelvis obtained. The collection was localized and an appropriate skin entry site was determined and marked. The operative field was prepped with chlorhexidinein a sterile fashion, and a sterile drape was applied covering the operative field. A sterile gown and sterile gloves were used for the procedure. Local anesthesia was provided with 1% Lidocaine. Under CT fluoroscopic guidance, an 18 gauge trocar needle was advanced into the collection. Viscous green opaque fluid could be aspirated. An Amplatz guidewire advanced easily within the collection, its position confirmed on CT. Tract dilated to facilitate placement of a 12 French pigtail drain catheter, formed centrally within the collection. Approximately 10 mL of purulent material were aspirated sent for Gram stain and culture. The catheter was secured externally with 0 Prolene suture and StatLock, flushed, and placed to gravity drain bag. The patient tolerated the procedure well. COMPLICATIONS: None immediate FINDINGS: The right lower quadrant/pelvic collection was again localized. An appropriate safe approach was identified. 12 French pigtail drain catheter placed. Sample of the aspirate sent for Gram stain and culture.  IMPRESSION: 1. Technically successful CT-guided right pelvic abscess drain catheter placement. Electronically Signed   By: Lucrezia Europe M.D.   On: 07/12/2017 09:49    Anti-infectives: Anti-infectives (From admission, onward)   Start     Dose/Rate Route Frequency Ordered Stop   07/13/17 0645  metroNIDAZOLE (FLAGYL) IVPB 500 mg     500 mg 100 mL/hr over 60 Minutes Intravenous Every 8 hours 07/13/17 0639     07/11/17 2100  cefTRIAXone (ROCEPHIN) 2 g in sodium chloride 0.9 % 100 mL IVPB     2 g 200 mL/hr over 30 Minutes Intravenous Every 24 hours 07/11/17 2003     07/11/17 1900  ciprofloxacin (CIPRO) IVPB 400 mg  Status:  Discontinued     400 mg 200 mL/hr over 60 Minutes Intravenous  Once 07/11/17 1847 07/11/17 2002   07/11/17 1900  metroNIDAZOLE (FLAGYL) IVPB 500 mg     500 mg 100 mL/hr over 60 Minutes Intravenous  Once 07/11/17 1847 07/11/17 2120      Assessment/Plan:  Acute diverticulitis with abscess. Second episode-2006 was first Stable following IR drainage of abscess He is on Rocephin.  I have added Flagyl Clear liquid diet CBC tomorrow If surgical intervention required in future, hoping for 1 stage resection  CAD - history MI; status post CABG 2009 VTE - lovenox   LOS: 2 days    Renelda Loma Alyssa Grove 07/13/2017

## 2017-07-14 LAB — CBC
HEMATOCRIT: 43.1 % (ref 39.0–52.0)
HEMOGLOBIN: 14.4 g/dL (ref 13.0–17.0)
MCH: 29 pg (ref 26.0–34.0)
MCHC: 33.4 g/dL (ref 30.0–36.0)
MCV: 86.7 fL (ref 78.0–100.0)
Platelets: 313 10*3/uL (ref 150–400)
RBC: 4.97 MIL/uL (ref 4.22–5.81)
RDW: 12.5 % (ref 11.5–15.5)
WBC: 9.9 10*3/uL (ref 4.0–10.5)

## 2017-07-14 LAB — BASIC METABOLIC PANEL
ANION GAP: 13 (ref 5–15)
BUN: 8 mg/dL (ref 6–20)
CHLORIDE: 105 mmol/L (ref 101–111)
CO2: 19 mmol/L — ABNORMAL LOW (ref 22–32)
Calcium: 9 mg/dL (ref 8.9–10.3)
Creatinine, Ser: 0.89 mg/dL (ref 0.61–1.24)
GFR calc Af Amer: >60 mL/min (ref >60)
Glucose, Bld: 97 mg/dL (ref 65–99)
POTASSIUM: 4.1 mmol/L (ref 3.5–5.1)
SODIUM: 137 mmol/L (ref 135–145)

## 2017-07-14 NOTE — Progress Notes (Signed)
Patient refused VPAP HS tonight.

## 2017-07-14 NOTE — Progress Notes (Signed)
Subjective: Continues to feel better. Still tender at drain site but deep visceral pain is much less. Voiding without difficulty. Had a solid stool Tolerating clear liquid diet Labs pending Drainage brown and somewhat feculent-looking but low volume  Objective: Vital signs in last 24 hours: Temp:  [97.7 F (36.5 C)-99.4 F (37.4 C)] 97.7 F (36.5 C) (03/10 0445) Pulse Rate:  [75-92] 75 (03/10 0445) Resp:  [18] 18 (03/09 2028) BP: (114-119)/(63-77) 114/77 (03/10 0445) SpO2:  [94 %-100 %] 99 % (03/10 0445) Last BM Date: 07/13/17  Intake/Output from previous day: 03/09 0701 - 03/10 0700 In: 3125 [P.O.:700; I.V.:2025; IV Piggyback:400] Out: 4259 [Urine:3975; Drains:20] Intake/Output this shift: Total I/O In: 1590 [P.O.:220; I.V.:1170; IV Piggyback:200] Out: 800 [Urine:800]    PE General appearance: Alert.  Pleasant.  No apparent distress Resp: clear to auscultation bilaterally GI:  soft.  Nondistended.  Active bowel sounds.  Drain right lower quadrant with purulent brown output. Only 20 mL output recorded. Mild tenderness to palpate left lower quadrant.  More tender around drain site.  Upper abdomen soft and nontender no guarding.  No mass. Extremities: extremities normal, atraumatic, no cyanosis or edema     Lab Results:  Recent Labs    07/12/17 0609 07/13/17 0357  WBC 12.5* 12.8*  HGB 13.0 13.8  HCT 38.9* 40.5  PLT 314 300   BMET Recent Labs    07/12/17 0609 07/13/17 0357  NA 139 135  K 3.9 3.9  CL 107 102  CO2 22 21*  GLUCOSE 92 104*  BUN 9 8  CREATININE 1.01 0.85  CALCIUM 8.8* 8.8*   PT/INR Recent Labs    07/12/17 0737  LABPROT 15.0  INR 1.19   ABG No results for input(s): PHART, HCO3 in the last 72 hours.  Invalid input(s): PCO2, PO2  Studies/Results: Ct Image Guided Drainage By Percutaneous Catheter  Result Date: 07/12/2017 CLINICAL DATA:  Diverticular abscess EXAM: CT GUIDED DRAINAGE OF RIGHT PELVIC ABSCESS ANESTHESIA/SEDATION:  Intravenous Fentanyl and Versed were administered as conscious sedation during continuous monitoring of the patient's level of consciousness and physiological / cardiorespiratory status by the radiology RN, with a total moderate sedation time of 13 minutes. PROCEDURE: The procedure, risks, benefits, and alternatives were explained to the patient. Questions regarding the procedure were encouraged and answered. The patient understands and consents to the procedure. Select axial scans through the lower abdomen and pelvis obtained. The collection was localized and an appropriate skin entry site was determined and marked. The operative field was prepped with chlorhexidinein a sterile fashion, and a sterile drape was applied covering the operative field. A sterile gown and sterile gloves were used for the procedure. Local anesthesia was provided with 1% Lidocaine. Under CT fluoroscopic guidance, an 18 gauge trocar needle was advanced into the collection. Viscous green opaque fluid could be aspirated. An Amplatz guidewire advanced easily within the collection, its position confirmed on CT. Tract dilated to facilitate placement of a 12 French pigtail drain catheter, formed centrally within the collection. Approximately 10 mL of purulent material were aspirated sent for Gram stain and culture. The catheter was secured externally with 0 Prolene suture and StatLock, flushed, and placed to gravity drain bag. The patient tolerated the procedure well. COMPLICATIONS: None immediate FINDINGS: The right lower quadrant/pelvic collection was again localized. An appropriate safe approach was identified. 12 French pigtail drain catheter placed. Sample of the aspirate sent for Gram stain and culture. IMPRESSION: 1. Technically successful CT-guided right pelvic abscess drain catheter placement. Electronically Signed  By: Lucrezia Europe M.D.   On: 07/12/2017 09:49    Anti-infectives: Anti-infectives (From admission, onward)   Start      Dose/Rate Route Frequency Ordered Stop   07/13/17 0645  metroNIDAZOLE (FLAGYL) IVPB 500 mg     500 mg 100 mL/hr over 60 Minutes Intravenous Every 8 hours 07/13/17 0639     07/11/17 2100  cefTRIAXone (ROCEPHIN) 2 g in sodium chloride 0.9 % 100 mL IVPB     2 g 200 mL/hr over 30 Minutes Intravenous Every 24 hours 07/11/17 2003     07/11/17 1900  ciprofloxacin (CIPRO) IVPB 400 mg  Status:  Discontinued     400 mg 200 mL/hr over 60 Minutes Intravenous  Once 07/11/17 1847 07/11/17 2002   07/11/17 1900  metroNIDAZOLE (FLAGYL) IVPB 500 mg     500 mg 100 mL/hr over 60 Minutes Intravenous  Once 07/11/17 1847 07/11/17 2120      Assessment/Plan:   Acute diverticulitis with abscess. Second episode-2006 was first Stable following IR drainage of abscess He is on Rocephin.  I have added Flagyl Advance to full liquid diet CBC tomorrow If surgical intervention required in future, hoping for 1 stage resection  CAD - history MI; status post CABG 2009.no cardiac symptoms or arrhythmia. VTE - lovenox     LOS: 3 days    Adin Hector 07/14/2017

## 2017-07-14 NOTE — Progress Notes (Addendum)
PROGRESS NOTE  [name]  MOQ:[id number] DOB: March 06, 1966 DOA: 07/11/2017 PCP: Silverio Decamp, MD  Brief Narrative:   Vision is a 52 year old male with history of CAD status post CABG in 2009, diverticulitis who presented to the with abdominal pain.  He had had a 1.5-week history of right lower quadrant abdominal pain and pelvic pain and had been put on ciprofloxacin for presumed prostatitis about 4 days prior to admission.  His symptoms did not improve and he had an outpatient CT on 3/7 which demonstrated diverticulitis with a 3.7 x 2.8 cm sigmoid abscess.  He was referred for admission and underwent drain placement by interventional radiology on 3/8.  Assessment & Plan:  Diverticulitis of large intestine with 3.7 x 2.8 cm sigmoid abscess status post drain placement on 3/8 -  BCx NGTD -  Wound culture:  E. Coli, culture still pending - Continue ceftriaxone and Flagyl - d/c IV fluids - Advance to FLD -Appreciate general surgery and interventional radiology assistance  CAD, chest pain-free, - continue aspirin and statin  DVT prophylaxis: Lovenox Code Status: Full code Family Communication: Patient, wife at bedside Disposition Plan: Home in a few days once diet advanced   Consultants:   General surgery  Interventional radiology  Procedures:  Drain placement in his sigmoid abscess on 3/8  Antimicrobials:  Anti-infectives (From admission, onward)   Start     Dose/Rate Route Frequency Ordered Stop   07/13/17 0645  metroNIDAZOLE (FLAGYL) IVPB 500 mg     500 mg 100 mL/hr over 60 Minutes Intravenous Every 8 hours 07/13/17 0639     07/11/17 2100  cefTRIAXone (ROCEPHIN) 2 g in sodium chloride 0.9 % 100 mL IVPB     2 g 200 mL/hr over 30 Minutes Intravenous Every 24 hours 07/11/17 2003     07/11/17 1900  ciprofloxacin (CIPRO) IVPB 400 mg  Status:  Discontinued     400 mg 200 mL/hr over 60 Minutes Intravenous  Once 07/11/17 1847 07/11/17 2002   07/11/17 1900   metroNIDAZOLE (FLAGYL) IVPB 500 mg     500 mg 100 mL/hr over 60 Minutes Intravenous  Once 07/11/17 1847 07/11/17 2120       Subjective:  Abdominal pain he continues to improve.  He denies nausea and tolerated his clear liquids diet yesterday.  He has ordered a full liquids breakfast and history has not yet arrived.  He had a bowel movement that was formed yesterday.  He continues to try ambulation in the hall.  Objective: Vitals:   07/13/17 1300 07/13/17 2028 07/14/17 0445 07/14/17 1403  BP: 119/63 116/66 114/77 110/69  Pulse: 84 92 75 99  Resp: 18 18  17   Temp: 99.4 F (37.4 C) 98.5 F (36.9 C) 97.7 F (36.5 C) 98.6 F (37 C)  TempSrc: Oral Oral Oral Oral  SpO2: 94% 100% 99% 98%  Weight:      Height:        Intake/Output Summary (Last 24 hours) at 07/14/2017 1417 Last data filed at 07/14/2017 1341 Gross per 24 hour  Intake 2645 ml  Output 4295 ml  Net -1650 ml   Filed Weights   07/11/17 2103  Weight: 101.4 kg (223 lb 8.7 oz)    Examination:  General exam:  Adult male.  No acute distress.  HEENT:  NCAT, MMM Respiratory system: Clear to auscultation bilaterally Cardiovascular system: Regular rate and rhythm, normal S1/S2. No murmurs, rubs, gallops or clicks.  Warm extremities Gastrointestinal system: Normal active bowel sounds, soft,  nondistended, tender to palpation in the right lower quadrant without rebound or guarding.  His drain has thick brown sludge MSK:  Normal tone and bulk, no lower extremity edema Neuro:  Grossly intact    Data Reviewed: I have personally reviewed following labs and imaging studies  CBC: Recent Labs  Lab 07/08/17 1515 07/11/17 1155 07/12/17 0609 07/13/17 0357 07/14/17 0441  WBC 14.6* 13.0* 12.5* 12.8* 9.9  NEUTROABS  --  8,918*  --   --   --   HGB 14.2 16.0 13.0 13.8 14.4  HCT 40.1 46.4 38.9* 40.5 43.1  MCV 82.3 83.3 85.9 85.3 86.7  PLT 355 360 314 300 833   Basic Metabolic Panel: Recent Labs  Lab 07/11/17 1155  07/11/17 1900 07/12/17 0609 07/13/17 0357 07/14/17 0441  NA 139 138 139 135 137  K 4.2 3.9 3.9 3.9 4.1  CL 105 102 107 102 105  CO2 23 25 22  21* 19*  GLUCOSE 95 88 92 104* 97  BUN 10 8 9 8 8   CREATININE 1.00 1.01 1.01 0.85 0.89  CALCIUM 9.6 9.4 8.8* 8.8* 9.0   GFR: Estimated Creatinine Clearance: 124 mL/min (by C-G formula based on SCr of 0.89 mg/dL). Liver Function Tests: Recent Labs  Lab 07/08/17 1515 07/11/17 1155 07/11/17 1900  AST 19 22 28   ALT 33 30 33  ALKPHOS  --   --  71  BILITOT 0.4 0.4 0.6  PROT 7.4 7.7 7.4  ALBUMIN  --   --  3.8   No results for input(s): LIPASE, AMYLASE in the last 168 hours. No results for input(s): AMMONIA in the last 168 hours. Coagulation Profile: Recent Labs  Lab 07/12/17 0737  INR 1.19   Cardiac Enzymes: No results for input(s): CKTOTAL, CKMB, CKMBINDEX, TROPONINI in the last 168 hours. BNP (last 3 results) No results for input(s): PROBNP in the last 8760 hours. HbA1C: No results for input(s): HGBA1C in the last 72 hours. CBG: No results for input(s): GLUCAP in the last 168 hours. Lipid Profile: No results for input(s): CHOL, HDL, LDLCALC, TRIG, CHOLHDL, LDLDIRECT in the last 72 hours. Thyroid Function Tests: No results for input(s): TSH, T4TOTAL, FREET4, T3FREE, THYROIDAB in the last 72 hours. Anemia Panel: No results for input(s): VITAMINB12, FOLATE, FERRITIN, TIBC, IRON, RETICCTPCT in the last 72 hours. Urine analysis:    Component Value Date/Time   COLORURINE STRAW (A) 07/11/2017 1831   APPEARANCEUR CLEAR 07/11/2017 1831   LABSPEC >1.046 (H) 07/11/2017 1831   PHURINE 5.0 07/11/2017 1831   GLUCOSEU NEGATIVE 07/11/2017 1831   HGBUR NEGATIVE 07/11/2017 1831   BILIRUBINUR NEGATIVE 07/11/2017 1831   BILIRUBINUR negative 07/08/2017 1447   KETONESUR NEGATIVE 07/11/2017 1831   PROTEINUR NEGATIVE 07/11/2017 1831   UROBILINOGEN 0.2 07/08/2017 1447   UROBILINOGEN 1.0 01/08/2008 1143   NITRITE NEGATIVE 07/11/2017 1831    LEUKOCYTESUR NEGATIVE 07/11/2017 1831   Sepsis Labs: @LABRCNTIP (procalcitonin:4,lacticidven:4)  ) Recent Results (from the past 240 hour(s))  Urine Culture     Status: None   Collection Time: 07/08/17  2:59 PM  Result Value Ref Range Status   MICRO NUMBER: [id number]  Final   SPECIMEN QUALITY: ADEQUATE  Final   Sample Source URINE  Final   STATUS: FINAL  Final   Result: No Growth  Final  Culture, blood (Routine x 2)     Status: None (Preliminary result)   Collection Time: 07/11/17  6:50 PM  Result Value Ref Range Status   Specimen Description BLOOD RIGHT ANTECUBITAL  Final  Special Requests   Final    BOTTLES DRAWN AEROBIC AND ANAEROBIC Blood Culture adequate volume   Culture   Final    NO GROWTH 2 DAYS Performed at [facility], [place] [address], [place], [place] [address]    Report Status PENDING  Incomplete  Culture, blood (Routine x 2)     Status: None (Preliminary result)   Collection Time: 07/11/17  7:00 PM  Result Value Ref Range Status   Specimen Description BLOOD RIGHT HAND  Final   Special Requests IN PEDIATRIC BOTTLE Blood Culture adequate volume  Final   Culture   Final    NO GROWTH 2 DAYS Performed at [facility], [place] [address], [place], [place] [address]    Report Status PENDING  Incomplete  Body fluid culture     Status: None (Preliminary result)   Collection Time: 07/12/17 10:29 AM  Result Value Ref Range Status   Specimen Description ABDOMEN  Final   Special Requests NONE  Final   Gram Stain   Final    ABUNDANT WBC PRESENT, PREDOMINANTLY PMN MODERATE GRAM POSITIVE COCCI IN PAIRS AND CHAINS RARE GRAM NEGATIVE RODS    Culture   Final    MODERATE ESCHERICHIA COLI CULTURE REINCUBATED FOR BETTER GROWTH HOLDING FOR POSSIBLE ANAEROBE Performed at [facility], [place] [address] [place], [place] [address]    Report Status PENDING  Incomplete   Organism ID, Bacteria ESCHERICHIA COLI  Final      Susceptibility   Escherichia coli - MIC*     AMPICILLIN >=32 RESISTANT Resistant     CEFAZOLIN <=4 SENSITIVE Sensitive     CEFEPIME <=1 SENSITIVE Sensitive     CEFTAZIDIME <=1 SENSITIVE Sensitive     CEFTRIAXONE <=1 SENSITIVE Sensitive     CIPROFLOXACIN <=0.25 SENSITIVE Sensitive     GENTAMICIN <=1 SENSITIVE Sensitive     IMIPENEM <=0.25 SENSITIVE Sensitive     TRIMETH/SULFA <=20 SENSITIVE Sensitive     AMPICILLIN/SULBACTAM 16 INTERMEDIATE Intermediate     PIP/TAZO <=4 SENSITIVE Sensitive     Extended ESBL NEGATIVE Sensitive     * MODERATE ESCHERICHIA COLI      Radiology Studies: No results found.   Scheduled Meds: . aspirin EC  81 mg Oral Daily  . enoxaparin (LOVENOX) injection  40 mg Subcutaneous Q24H  . loratadine  10 mg Oral Daily  . montelukast  10 mg Oral QHS  . multivitamin with minerals  1 tablet Oral Daily  . rosuvastatin  40 mg Oral Daily   Continuous Infusions: . cefTRIAXone (ROCEPHIN)  IV Stopped (07/13/17 2120)  . dextrose 5 % and 0.45 % NaCl with KCl 20 mEq/L 75 mL/hr at 07/14/17 0026  . metronidazole 500 mg (07/14/17 1348)     LOS: 3 days    Time spent: 30 min    Janece Canterbury, MD [facility] Pager [phone]  If 7PM-7AM, please contact night-coverage [email or web address] Password [email or web address] 07/14/2017, 2:17 PM

## 2017-07-15 LAB — CBC
HCT: 43.7 % (ref 39.0–52.0)
Hemoglobin: 15.2 g/dL (ref 13.0–17.0)
MCH: 29.5 pg (ref 26.0–34.0)
MCHC: 34.8 g/dL (ref 30.0–36.0)
MCV: 84.9 fL (ref 78.0–100.0)
PLATELETS: 361 10*3/uL (ref 150–400)
RBC: 5.15 MIL/uL (ref 4.22–5.81)
RDW: 12.1 % (ref 11.5–15.5)
WBC: 11.7 10*3/uL — ABNORMAL HIGH (ref 4.0–10.5)

## 2017-07-15 LAB — BASIC METABOLIC PANEL
Anion gap: 9 (ref 5–15)
BUN: 5 mg/dL — AB (ref 6–20)
CALCIUM: 9.1 mg/dL (ref 8.9–10.3)
CHLORIDE: 106 mmol/L (ref 101–111)
CO2: 22 mmol/L (ref 22–32)
CREATININE: 0.91 mg/dL (ref 0.61–1.24)
GFR calc non Af Amer: >60 mL/min (ref >60)
Glucose, Bld: 115 mg/dL — ABNORMAL HIGH (ref 65–99)
Potassium: 4.2 mmol/L (ref 3.5–5.1)
SODIUM: 137 mmol/L (ref 135–145)

## 2017-07-15 NOTE — Progress Notes (Signed)
PROGRESS NOTE  [name]  WGN:[id number] DOB: [birth year] DOA: 07/11/2017 PCP: Silverio Decamp, MD  Brief Narrative:   Vision is a 52 year old male with history of CAD status post CABG in 2009, diverticulitis who presented to the with abdominal pain.  He had had a 1.5-week history of right lower quadrant abdominal pain and pelvic pain and had been put on ciprofloxacin for presumed prostatitis about 4 days prior to admission.  His symptoms did not improve and he had an outpatient CT on 3/7 which demonstrated diverticulitis with a 3.7 x 2.8 cm sigmoid abscess.  He was referred for admission and underwent drain placement by interventional radiology on 3/8.  Assessment & Plan:  Diverticulitis of large intestine with 3.7 x 2.8 cm sigmoid abscess status post drain placement on 3/8.  WBC rising slightly -  BCx NGTD -  Wound culture:  E. Coli, Strep viridans, mixed flora -  Continue ceftriaxone and Flagyl -   Continue FLD -  Repeat CBC in AM -   Appreciate general surgery and interventional radiology assistance  CAD, chest pain-free, - continue aspirin and statin  DVT prophylaxis: Lovenox Code Status: Full code Family Communication: Patient, wife at bedside Disposition Plan: Home in a few days once diet advanced   Consultants:   General surgery  Interventional radiology  Procedures:  Drain placement in his sigmoid abscess on 3/8  Antimicrobials:  Anti-infectives (From admission, onward)   Start     Dose/Rate Route Frequency Ordered Stop   07/13/17 0645  metroNIDAZOLE (FLAGYL) IVPB 500 mg     500 mg 100 mL/hr over 60 Minutes Intravenous Every 8 hours 07/13/17 0639     07/11/17 2100  cefTRIAXone (ROCEPHIN) 2 g in sodium chloride 0.9 % 100 mL IVPB     2 g 200 mL/hr over 30 Minutes Intravenous Every 24 hours 07/11/17 2003     07/11/17 1900  ciprofloxacin (CIPRO) IVPB 400 mg  Status:  Discontinued     400 mg 200 mL/hr over 60 Minutes Intravenous  Once 07/11/17 1847  07/11/17 2002   07/11/17 1900  metroNIDAZOLE (FLAGYL) IVPB 500 mg     500 mg 100 mL/hr over 60 Minutes Intravenous  Once 07/11/17 1847 07/11/17 2120       Subjective:  Had a normal firm bowel movement.  He denies nausea.  His abdominal pain continues to improve.  He denies fevers or chills.  Objective: Vitals:   07/14/17 1403 07/14/17 2100 07/15/17 0532 07/15/17 1524  BP: 110/69 109/75 108/68 111/68  Pulse: 99 95 81 91  Resp: 17 17 17 20   Temp: 98.6 F (37 C) 98.7 F (37.1 C) 98 F (36.7 C) 98.4 F (36.9 C)  TempSrc: Oral Oral Oral Oral  SpO2: 98% 96% 98% 100%  Weight:      Height:        Intake/Output Summary (Last 24 hours) at 07/15/2017 1541 Last data filed at 07/15/2017 1500 Gross per 24 hour  Intake 840 ml  Output 2208 ml  Net -1368 ml   Filed Weights   07/11/17 2103  Weight: 101.4 kg (223 lb 8.7 oz)    Examination:  General exam:  Adult male.  No acute distress.  HEENT:  NCAT, MMM Respiratory system: Clear to auscultation bilaterally Cardiovascular system: Regular rate and rhythm, normal S1/S2. No murmurs, rubs, gallops or clicks.  Warm extremities Gastrointestinal system: Normal active bowel sounds, soft, nondistended, tender to palpation in the right lower quadrant without rebound or guarding.  His  drain has greenish brown fluid with some soft brown material that is somewhat purulent MSK:  Normal tone and bulk, no lower extremity edema Neuro:  Grossly intact   Data Reviewed: I have personally reviewed following labs and imaging studies  CBC: Recent Labs  Lab 07/11/17 1155 07/12/17 0609 07/13/17 0357 07/14/17 0441 07/15/17 0355  WBC 13.0* 12.5* 12.8* 9.9 11.7*  NEUTROABS 8,918*  --   --   --   --   HGB 16.0 13.0 13.8 14.4 15.2  HCT 46.4 38.9* 40.5 43.1 43.7  MCV 83.3 85.9 85.3 86.7 84.9  PLT 360 314 300 313 694   Basic Metabolic Panel: Recent Labs  Lab 07/11/17 1900 07/12/17 0609 07/13/17 0357 07/14/17 0441 07/15/17 0355  NA 138 139  135 137 137  K 3.9 3.9 3.9 4.1 4.2  CL 102 107 102 105 106  CO2 25 22 21* 19* 22  GLUCOSE 88 92 104* 97 115*  BUN 8 9 8 8  5*  CREATININE 1.01 1.01 0.85 0.89 0.91  CALCIUM 9.4 8.8* 8.8* 9.0 9.1   GFR: Estimated Creatinine Clearance: 121.2 mL/min (by C-G formula based on SCr of 0.91 mg/dL). Liver Function Tests: Recent Labs  Lab 07/08/17 1515 07/11/17 1155 07/11/17 1900  AST 19 22 28   ALT 33 30 33  ALKPHOS  --   --  71  BILITOT 0.4 0.4 0.6  PROT 7.4 7.7 7.4  ALBUMIN  --   --  3.8   No results for input(s): LIPASE, AMYLASE in the last 168 hours. No results for input(s): AMMONIA in the last 168 hours. Coagulation Profile: Recent Labs  Lab 07/12/17 0737  INR 1.19   Cardiac Enzymes: No results for input(s): CKTOTAL, CKMB, CKMBINDEX, TROPONINI in the last 168 hours. BNP (last 3 results) No results for input(s): PROBNP in the last 8760 hours. HbA1C: No results for input(s): HGBA1C in the last 72 hours. CBG: No results for input(s): GLUCAP in the last 168 hours. Lipid Profile: No results for input(s): CHOL, HDL, LDLCALC, TRIG, CHOLHDL, LDLDIRECT in the last 72 hours. Thyroid Function Tests: No results for input(s): TSH, T4TOTAL, FREET4, T3FREE, THYROIDAB in the last 72 hours. Anemia Panel: No results for input(s): VITAMINB12, FOLATE, FERRITIN, TIBC, IRON, RETICCTPCT in the last 72 hours. Urine analysis:    Component Value Date/Time   COLORURINE STRAW (A) 07/11/2017 1831   APPEARANCEUR CLEAR 07/11/2017 1831   LABSPEC >1.046 (H) 07/11/2017 1831   PHURINE 5.0 07/11/2017 1831   GLUCOSEU NEGATIVE 07/11/2017 1831   HGBUR NEGATIVE 07/11/2017 1831   BILIRUBINUR NEGATIVE 07/11/2017 1831   BILIRUBINUR negative 07/08/2017 1447   KETONESUR NEGATIVE 07/11/2017 1831   PROTEINUR NEGATIVE 07/11/2017 1831   UROBILINOGEN 0.2 07/08/2017 1447   UROBILINOGEN 1.0 01/08/2008 1143   NITRITE NEGATIVE 07/11/2017 1831   LEUKOCYTESUR NEGATIVE 07/11/2017 1831   Sepsis  Labs: @LABRCNTIP (procalcitonin:4,lacticidven:4)  ) Recent Results (from the past 240 hour(s))  Urine Culture     Status: None   Collection Time: 07/08/17  2:59 PM  Result Value Ref Range Status   MICRO NUMBER: [id number]  Final   SPECIMEN QUALITY: ADEQUATE  Final   Sample Source URINE  Final   STATUS: FINAL  Final   Result: No Growth  Final  Culture, blood (Routine x 2)     Status: None (Preliminary result)   Collection Time: 07/11/17  6:50 PM  Result Value Ref Range Status   Specimen Description BLOOD RIGHT ANTECUBITAL  Final   Special Requests   Final  BOTTLES DRAWN AEROBIC AND ANAEROBIC Blood Culture adequate volume   Culture   Final    NO GROWTH 4 DAYS Performed at [facility], [place] [address], [place], [place] [address]    Report Status PENDING  Incomplete  Culture, blood (Routine x 2)     Status: None (Preliminary result)   Collection Time: 07/11/17  7:00 PM  Result Value Ref Range Status   Specimen Description BLOOD RIGHT HAND  Final   Special Requests IN PEDIATRIC BOTTLE Blood Culture adequate volume  Final   Culture   Final    NO GROWTH 4 DAYS Performed at [facility], [place] [address] [place],  [address]    Report Status PENDING  Incomplete  Body fluid culture     Status: None (Preliminary result)   Collection Time: 07/12/17 10:29 AM  Result Value Ref Range Status   Specimen Description ABDOMEN  Final   Special Requests NONE  Final   Gram Stain   Final    ABUNDANT WBC PRESENT, PREDOMINANTLY PMN MODERATE GRAM POSITIVE COCCI IN PAIRS AND CHAINS RARE GRAM NEGATIVE RODS    Culture   Final    MODERATE ESCHERICHIA COLI MODERATE VIRIDANS STREPTOCOCCUS CULTURE REINCUBATED FOR BETTER GROWTH HOLDING FOR POSSIBLE ANAEROBE Performed at [facility], [place] [address], [place], [place] [address]    Report Status PENDING  Incomplete   Organism ID, Bacteria ESCHERICHIA COLI  Final      Susceptibility   Escherichia coli - MIC*    AMPICILLIN >=32  RESISTANT Resistant     CEFAZOLIN <=4 SENSITIVE Sensitive     CEFEPIME <=1 SENSITIVE Sensitive     CEFTAZIDIME <=1 SENSITIVE Sensitive     CEFTRIAXONE <=1 SENSITIVE Sensitive     CIPROFLOXACIN <=0.25 SENSITIVE Sensitive     GENTAMICIN <=1 SENSITIVE Sensitive     IMIPENEM <=0.25 SENSITIVE Sensitive     TRIMETH/SULFA <=20 SENSITIVE Sensitive     AMPICILLIN/SULBACTAM 16 INTERMEDIATE Intermediate     PIP/TAZO <=4 SENSITIVE Sensitive     Extended ESBL NEGATIVE Sensitive     * MODERATE ESCHERICHIA COLI      Radiology Studies: No results found.   Scheduled Meds: . aspirin EC  81 mg Oral Daily  . enoxaparin (LOVENOX) injection  40 mg Subcutaneous Q24H  . loratadine  10 mg Oral Daily  . montelukast  10 mg Oral QHS  . multivitamin with minerals  1 tablet Oral Daily  . rosuvastatin  40 mg Oral Daily   Continuous Infusions: . cefTRIAXone (ROCEPHIN)  IV Stopped (07/14/17 2147)  . metronidazole Stopped (07/15/17 1516)     LOS: 4 days    Time spent: 30 min    Janece Canterbury, MD [facility] Pager [phone]  If 7PM-7AM, please contact night-coverage [email or web address] Password [facility] 07/15/2017, 3:41 PM

## 2017-07-15 NOTE — Progress Notes (Signed)
[facility] Progress Note     Subjective: CC: mild abdominal pain Patient with very mild pain at drain site, only with certain movements. Tolerating diet, no n/v. Having bowel function, BM this morning was soft but not loose and non-bloody. UOP good. VSS.   Objective: Vital signs in last 24 hours: Temp:  [98 F (36.7 C)-98.7 F (37.1 C)] 98 F (36.7 C) (03/11 0532) Pulse Rate:  [81-99] 81 (03/11 0532) Resp:  [17] 17 (03/11 0532) BP: (108-110)/(68-75) 108/68 (03/11 0532) SpO2:  [96 %-98 %] 98 % (03/11 0532) Last BM Date: 07/14/17  Intake/Output from previous day: 03/10 0701 - 03/11 0700 In: 1630.8 [P.O.:832; I.V.:598.8; IV Piggyback:200] Out: 2633 [Urine:2625; Drains:8] Intake/Output this shift: No intake/output data recorded.  PE: Gen:  Alert, NAD, pleasant Card:  Regular rate and rhythm, pedal pulses 2+ BL Pulm:  Normal effort, clear to auscultation bilaterally Abd: Soft, mildly tender in RLQ, non-distended, bowel sounds present, no HSM, RLQ drain with brown feculent appearing drainage Skin: warm and dry, no rashes  Psych: A&Ox3   Lab Results:  Recent Labs    07/14/17 0441 07/15/17 0355  WBC 9.9 11.7*  HGB 14.4 15.2  HCT 43.1 43.7  PLT 313 361   BMET Recent Labs    07/14/17 0441 07/15/17 0355  NA 137 137  K 4.1 4.2  CL 105 106  CO2 19* 22  GLUCOSE 97 115*  BUN 8 5*  CREATININE 0.89 0.91  CALCIUM 9.0 9.1   PT/INR No results for input(s): LABPROT, INR in the last 72 hours. CMP     Component Value Date/Time   NA 137 07/15/2017 0355   K 4.2 07/15/2017 0355   CL 106 07/15/2017 0355   CO2 22 07/15/2017 0355   GLUCOSE 115 (H) 07/15/2017 0355   BUN 5 (L) 07/15/2017 0355   CREATININE 0.91 07/15/2017 0355   CREATININE 1.00 07/11/2017 1155   CALCIUM 9.1 07/15/2017 0355   PROT 7.4 07/11/2017 1900   ALBUMIN 3.8 07/11/2017 1900   AST 28 07/11/2017 1900   ALT 33 07/11/2017 1900   ALKPHOS 71 07/11/2017 1900   BILITOT 0.6 07/11/2017 1900   GFRNONAA >60 07/15/2017 0355   GFRNONAA 86 07/11/2017 1155   GFRAA >60 07/15/2017 0355   GFRAA 99 07/11/2017 1155   Lipase  No results found for: LIPASE     Studies/Results: No results found.  Anti-infectives: Anti-infectives (From admission, onward)   Start     Dose/Rate Route Frequency Ordered Stop   07/13/17 0645  metroNIDAZOLE (FLAGYL) IVPB 500 mg     500 mg 100 mL/hr over 60 Minutes Intravenous Every 8 hours 07/13/17 0639     07/11/17 2100  cefTRIAXone (ROCEPHIN) 2 g in sodium chloride 0.9 % 100 mL IVPB     2 g 200 mL/hr over 30 Minutes Intravenous Every 24 hours 07/11/17 2003     07/11/17 1900  ciprofloxacin (CIPRO) IVPB 400 mg  Status:  Discontinued     400 mg 200 mL/hr over 60 Minutes Intravenous  Once 07/11/17 1847 07/11/17 2002   07/11/17 1900  metroNIDAZOLE (FLAGYL) IVPB 500 mg     500 mg 100 mL/hr over 60 Minutes Intravenous  Once 07/11/17 1847 07/11/17 2120       Assessment/Plan CAD with Hx of MI - s/p CABG 2009, stable  Acute diverticulitis with abscess S/p IR drain 3/8 -second episode of tics with first being in 7782 and uncomplicated - WBC up to 42.3, afebrile, pain improved - recheck  CBC tomorrow - cx shows E.coli - continue abx - drain with 8cc recorded out in 24h - if WBC continues to increase may need to repeat CT  FEN: FLD VTE: SCDs, lovenox ID: cipro/flagyl 3/7; rocephin 3/7>>, flagyl 3/9>> Follow up: Dr. Dema Severin  Dispo: patient clinically feeling better. Hold off on advancing diet for right now with increase in WBC. Recheck labs tomorrow, if WBC continues to rise would repeat CT.    LOS: 4 days    Brigid Re , [facility] Surgery 07/15/2017, 9:05 AM Pager: [phone] Consults: [phone] Mon-Fri 7:00 am-4:30 pm Sat-Sun 7:00 am-11:30 am

## 2017-07-15 NOTE — Progress Notes (Signed)
Pt has refused usage of cpap at this time but knows he can contact respiratory if he changes his mind.  Rt will monitor.

## 2017-07-16 ENCOUNTER — Inpatient Hospital Stay (HOSPITAL_COMMUNITY): Payer: No Typology Code available for payment source

## 2017-07-16 LAB — BASIC METABOLIC PANEL
Anion gap: 10 (ref 5–15)
BUN: 6 mg/dL (ref 6–20)
CHLORIDE: 105 mmol/L (ref 101–111)
CO2: 21 mmol/L — AB (ref 22–32)
CREATININE: 0.89 mg/dL (ref 0.61–1.24)
Calcium: 9.2 mg/dL (ref 8.9–10.3)
GFR calc Af Amer: >60 mL/min (ref >60)
GFR calc non Af Amer: >60 mL/min (ref >60)
Glucose, Bld: 112 mg/dL — ABNORMAL HIGH (ref 65–99)
POTASSIUM: 4.1 mmol/L (ref 3.5–5.1)
SODIUM: 136 mmol/L (ref 135–145)

## 2017-07-16 LAB — CULTURE, BLOOD (ROUTINE X 2)
CULTURE: NO GROWTH
CULTURE: NO GROWTH
SPECIAL REQUESTS: ADEQUATE
SPECIAL REQUESTS: ADEQUATE

## 2017-07-16 LAB — BODY FLUID CULTURE

## 2017-07-16 LAB — CBC
HEMATOCRIT: 44.6 % (ref 39.0–52.0)
Hemoglobin: 15.5 g/dL (ref 13.0–17.0)
MCH: 29.6 pg (ref 26.0–34.0)
MCHC: 34.8 g/dL (ref 30.0–36.0)
MCV: 85.3 fL (ref 78.0–100.0)
Platelets: 405 10*3/uL — ABNORMAL HIGH (ref 150–400)
RBC: 5.23 MIL/uL (ref 4.22–5.81)
RDW: 12.3 % (ref 11.5–15.5)
WBC: 12.5 10*3/uL — AB (ref 4.0–10.5)

## 2017-07-16 MED ORDER — IOPAMIDOL (ISOVUE-300) INJECTION 61%
INTRAVENOUS | Status: AC
  Administered 2017-07-16: 11:00:00
  Filled 2017-07-16: qty 30

## 2017-07-16 MED ORDER — IOPAMIDOL (ISOVUE-300) INJECTION 61%
INTRAVENOUS | Status: AC
  Administered 2017-07-16: 100 mL
  Filled 2017-07-16: qty 100

## 2017-07-16 NOTE — Progress Notes (Signed)
Patients CAD is stable and he is improving.  Hospitalist service not adding to the plan progressing this patient towards discharge.  Spoke with PA Claiborne Billings who agreed that Surgery can manage this patient until discharge and can reconsult if needed.

## 2017-07-16 NOTE — Plan of Care (Deleted)
Patient denies any pain.

## 2017-07-16 NOTE — Progress Notes (Signed)
[facility] Progress Note     Subjective: CC: diverticulitis Patient clinically feels well. No abdominal pain, having bowel function. UOP good. VSS. WBC increasing.   Objective: Vital signs in last 24 hours: Temp:  [98.1 F (36.7 C)-99.1 F (37.3 C)] 98.1 F (36.7 C) (03/12 0445) Pulse Rate:  [84-91] 84 (03/12 0445) Resp:  [16-20] 16 (03/12 0445) BP: (111-122)/(68-76) 122/76 (03/12 0445) SpO2:  [98 %-100 %] 98 % (03/12 0445) Last BM Date: 07/15/17  Intake/Output from previous day: 03/11 0701 - 03/12 0700 In: 1110 [P.O.:710; IV Piggyback:400] Out: 1300 [Urine:1300] Intake/Output this shift: No intake/output data recorded.  PE: Gen:  Alert, NAD, pleasant Card:  Regular rate and rhythm, pedal pulses 2+ BL Pulm:  Normal effort, clear to auscultation bilaterally Abd: Soft, minimally tender at drain site, non-distended, bowel sounds present, no HSM, RLQ drain with minimal brown drainage Skin: warm and dry, no rashes  Psych: A&Ox3    Lab Results:  Recent Labs    07/15/17 0355 07/16/17 0452  WBC 11.7* 12.5*  HGB 15.2 15.5  HCT 43.7 44.6  PLT 361 405*   BMET Recent Labs    07/15/17 0355 07/16/17 0452  NA 137 136  K 4.2 4.1  CL 106 105  CO2 22 21*  GLUCOSE 115* 112*  BUN 5* 6  CREATININE 0.91 0.89  CALCIUM 9.1 9.2   PT/INR No results for input(s): LABPROT, INR in the last 72 hours. CMP     Component Value Date/Time   NA 136 07/16/2017 0452   K 4.1 07/16/2017 0452   CL 105 07/16/2017 0452   CO2 21 (L) 07/16/2017 0452   GLUCOSE 112 (H) 07/16/2017 0452   BUN 6 07/16/2017 0452   CREATININE 0.89 07/16/2017 0452   CREATININE 1.00 07/11/2017 1155   CALCIUM 9.2 07/16/2017 0452   PROT 7.4 07/11/2017 1900   ALBUMIN 3.8 07/11/2017 1900   AST 28 07/11/2017 1900   ALT 33 07/11/2017 1900   ALKPHOS 71 07/11/2017 1900   BILITOT 0.6 07/11/2017 1900   GFRNONAA >60 07/16/2017 0452   GFRNONAA 86 07/11/2017 1155   GFRAA >60 07/16/2017 0452   GFRAA 99  07/11/2017 1155   Lipase  No results found for: LIPASE     Studies/Results: No results found.  Anti-infectives: Anti-infectives (From admission, onward)   Start     Dose/Rate Route Frequency Ordered Stop   07/13/17 0645  metroNIDAZOLE (FLAGYL) IVPB 500 mg     500 mg 100 mL/hr over 60 Minutes Intravenous Every 8 hours 07/13/17 0639     07/11/17 2100  cefTRIAXone (ROCEPHIN) 2 g in sodium chloride 0.9 % 100 mL IVPB     2 g 200 mL/hr over 30 Minutes Intravenous Every 24 hours 07/11/17 2003     07/11/17 1900  ciprofloxacin (CIPRO) IVPB 400 mg  Status:  Discontinued     400 mg 200 mL/hr over 60 Minutes Intravenous  Once 07/11/17 1847 07/11/17 2002   07/11/17 1900  metroNIDAZOLE (FLAGYL) IVPB 500 mg     500 mg 100 mL/hr over 60 Minutes Intravenous  Once 07/11/17 1847 07/11/17 2120       Assessment/Plan CAD with Hx of MI - s/p CABG 2009, stable  Acute diverticulitis with abscess S/p IR drain 3/8 -second episode of tics with first being in 1610 and uncomplicated - cx shows E.coli - continue abx - drain with 0 cc recorded out in 24h Leukocytosis - WBC trending up, 12.5 today  - repeat CT abd/pelvis today  FEN: FLD VTE: SCDs, lovenox ID: cipro/flagyl 3/7; rocephin 3/7>>, flagyl 3/9>> Follow up: Dr. Dema Severin  Dispo: patient clinically feeling better. Hold off on advancing diet for right now with increase in WBC. Repeat CT.     LOS: 5 days    Brigid Re , [facility] 07/16/2017, 8:23 AM Pager: [phone] Consults: [phone] Mon-Fri 7:00 am-4:30 pm Sat-Sun 7:00 am-11:30 am

## 2017-07-16 NOTE — Progress Notes (Signed)
Pt refusing CPAP for the night. RT will continue to monitor as needed.  

## 2017-07-17 ENCOUNTER — Other Ambulatory Visit: Payer: Self-pay | Admitting: Surgery

## 2017-07-17 DIAGNOSIS — K5720 Diverticulitis of large intestine with perforation and abscess without bleeding: Secondary | ICD-10-CM

## 2017-07-17 LAB — BASIC METABOLIC PANEL
Anion gap: 10 (ref 5–15)
BUN: 9 mg/dL (ref 6–20)
CHLORIDE: 108 mmol/L (ref 101–111)
CO2: 19 mmol/L — AB (ref 22–32)
CREATININE: 0.91 mg/dL (ref 0.61–1.24)
Calcium: 9 mg/dL (ref 8.9–10.3)
GFR calc Af Amer: >60 mL/min (ref >60)
GFR calc non Af Amer: >60 mL/min (ref >60)
Glucose, Bld: 110 mg/dL — ABNORMAL HIGH (ref 65–99)
Potassium: 4.1 mmol/L (ref 3.5–5.1)
Sodium: 137 mmol/L (ref 135–145)

## 2017-07-17 LAB — CBC
HCT: 44.7 % (ref 39.0–52.0)
Hemoglobin: 15 g/dL (ref 13.0–17.0)
MCH: 29 pg (ref 26.0–34.0)
MCHC: 33.6 g/dL (ref 30.0–36.0)
MCV: 86.5 fL (ref 78.0–100.0)
Platelets: 382 10*3/uL (ref 150–400)
RBC: 5.17 MIL/uL (ref 4.22–5.81)
RDW: 12.5 % (ref 11.5–15.5)
WBC: 10.3 10*3/uL (ref 4.0–10.5)

## 2017-07-17 MED ORDER — CIPROFLOXACIN HCL 500 MG PO TABS: 500.0000 mg | ORAL_TABLET | Freq: Two times a day (BID) | ORAL | 0 refills | Status: AC

## 2017-07-17 MED ORDER — SODIUM CHLORIDE 0.9% FLUSH: 10.0000 mL | INTRAVENOUS | 0 refills | Status: DC | PRN

## 2017-07-17 MED ORDER — ACETAMINOPHEN 325 MG PO TABS: 650.0000 mg | ORAL_TABLET | Freq: Four times a day (QID) | ORAL | Status: DC | PRN

## 2017-07-17 MED ORDER — METRONIDAZOLE 500 MG PO TABS: 500.0000 mg | ORAL_TABLET | Freq: Two times a day (BID) | ORAL | 0 refills | Status: AC

## 2017-07-17 NOTE — Discharge Instructions (Signed)
High-Fiber Diet Fiber, also called dietary fiber, is a type of carbohydrate found in fruits, vegetables, whole grains, and beans. A high-fiber diet can have many health benefits. Your health care provider may recommend a high-fiber diet to help:  Prevent constipation. Fiber can make your bowel movements more regular.  Lower your cholesterol.  Relieve hemorrhoids, uncomplicated diverticulosis, or irritable bowel syndrome.  Prevent overeating as part of a weight-loss plan.  Prevent heart disease, type 2 diabetes, and certain cancers.  What is my plan? The recommended daily intake of fiber includes:  38 grams for men under age 45.  46 grams for men over age 31.  56 grams for women under age 59.  57 grams for women over age 5.  You can get the recommended daily intake of dietary fiber by eating a variety of fruits, vegetables, grains, and beans. Your health care provider may also recommend a fiber supplement if it is not possible to get enough fiber through your diet. What do I need to know about a high-fiber diet?  Fiber supplements have not been widely studied for their effectiveness, so it is better to get fiber through food sources.  Always check the fiber content on thenutrition facts label of any prepackaged food. Look for foods that contain at least 5 grams of fiber per serving.  Ask your dietitian if you have questions about specific foods that are related to your condition, especially if those foods are not listed in the following section.  Increase your daily fiber consumption gradually. Increasing your intake of dietary fiber too quickly may cause bloating, cramping, or gas.  Drink plenty of water. Water helps you to digest fiber. What foods can I eat? Grains Whole-grain breads. Multigrain cereal. Oats and oatmeal. Brown rice. Barley. Bulgur wheat. [place]. Bran muffins. Popcorn. Rye wafer crackers. Vegetables Sweet potatoes. Spinach. Kale. Artichokes. Cabbage.  Broccoli. Green peas. Carrots. Squash. Fruits Berries. Pears. Apples. Oranges. Avocados. Prunes and raisins. Dried figs. Meats and Other Protein Sources [place], kidney, pinto, and soy beans. Split peas. Lentils. Nuts and seeds. Dairy Fiber-fortified yogurt. Beverages Fiber-fortified soy milk. Fiber-fortified orange juice. Other Fiber bars. The items listed above may not be a complete list of recommended foods or beverages. Contact your dietitian for more options. What foods are not recommended? Grains White bread. Pasta made with refined flour. White rice. Vegetables Fried potatoes. Canned vegetables. Well-cooked vegetables. Fruits Fruit juice. Cooked, strained fruit. Meats and Other Protein Sources Fatty cuts of meat. Fried Sales executive or fried fish. Dairy Milk. Yogurt. Cream cheese. Sour cream. Beverages Soft drinks. Other Cakes and pastries. Butter and oils. The items listed above may not be a complete list of foods and beverages to avoid. Contact your dietitian for more information. What are some tips for including high-fiber foods in my diet?  Eat a wide variety of high-fiber foods.  Make sure that half of all grains consumed each day are whole grains.  Replace breads and cereals made from refined flour or white flour with whole-grain breads and cereals.  Replace white rice with brown rice, bulgur wheat, or millet.  Start the day with a breakfast that is high in fiber, such as a cereal that contains at least 5 grams of fiber per serving.  Use beans in place of meat in soups, salads, or pasta.  Eat high-fiber snacks, such as berries, raw vegetables, nuts, or popcorn. This information is not intended to replace advice given to you by your health care provider. Make sure you discuss  any questions you have with your health care provider. Document Released: 04/23/2005 Document Revised: 09/29/2015 Document Reviewed: 10/06/2013 Elsevier Interactive Patient Education  2018  [employer]. Diverticulitis Diverticulitis is when small pockets in your large intestine (colon) get infected or swollen. This causes stomach pain and watery poop (diarrhea). These pouches are called diverticula. They form in people who have a condition called diverticulosis. Follow these instructions at home: Medicines  Take over-the-counter and prescription medicines only as told by your doctor. These include: ? Antibiotics. ? Pain medicines. ? Fiber pills. ? Probiotics. ? Stool softeners.  Do not drive or use heavy machinery while taking prescription pain medicine.  If you were prescribed an antibiotic, take it as told. Do not stop taking it even if you feel better. General instructions  Follow a diet as told by your doctor.  When you feel better, your doctor may tell you to change your diet. You may need to eat a lot of fiber. Fiber makes it easier to poop (have bowel movements). Healthy foods with fiber include: ? Berries. ? Beans. ? Lentils. ? Green vegetables.  Exercise 3 or more times a week. Aim for 30 minutes each time. Exercise enough to sweat and make your heart beat faster.  Keep all follow-up visits as told. This is important. You may need to have an exam of the large intestine. This is called a colonoscopy. Contact a doctor if:  Your pain does not get better.  You have a hard time eating or drinking.  You are not pooping like normal. Get help right away if:  Your pain gets worse.  Your problems do not get better.  Your problems get worse very fast.  You have a fever.  You throw up (vomit) more than one time.  You have poop that is: ? Bloody. ? Black. ? Tarry. Summary  Diverticulitis is when small pockets in your large intestine (colon) get infected or swollen.  Take medicines only as told by your doctor.  Follow a diet as told by your doctor. This information is not intended to replace advice given to you by your health care provider. Make  sure you discuss any questions you have with your health care provider. Document Released: 10/10/2007 Document Revised: 05/10/2016 Document Reviewed: 05/10/2016 Elsevier Interactive Patient Education  2017 [employer]   Percutaneous Abscess Drain, Care After This sheet gives you information about how to care for yourself after your procedure. Your health care provider may also give you more specific instructions. If you have problems or questions, contact your health care provider. What can I expect after the procedure? After your procedure, it is common to have:  A small amount of bruising and discomfort in the area where the drainage tube (catheter) was placed.  Sleepiness and fatigue. This should go away after the medicines you were given have worn off.  Follow these instructions at home: Incision care  Follow instructions from your health care provider about how to take care of your incision. Make sure you: ? Wash your hands with soap and water before you change your bandage (dressing). If soap and water are not available, use hand sanitizer. ? Change your dressing as told by your health care provider. ? Leave stitches (sutures), skin glue, or adhesive strips in place. These skin closures may need to stay in place for 2 weeks or longer. If adhesive strip edges start to loosen and curl up, you may trim the loose edges. Do not remove adhesive strips completely unless  your health care provider tells you to do that.  Check your incision area every day for signs of infection. Check for: ? More redness, swelling, or pain. ? More fluid or blood. ? Warmth. ? Pus or a bad smell. ? Fluid leaking from around your catheter (instead of fluid draining through your catheter). Catheter care  Follow instructions from your health care provider about emptying and cleaning your catheter and collection bag. You may need to clean the catheter every day so it does not clog.  If directed, write down  the following information every time you empty your bag: ? The date and time. ? The amount of drainage. General instructions  Rest at home for 1-2 days after your procedure. Return to your normal activities as told by your health care provider.  Do not take baths, swim, or use a hot tub for 24 hours after your procedure, or until your health care provider says that this is okay.  Take over-the-counter and prescription medicines only as told by your health care provider.  Keep all follow-up visits as told by your health care provider. This is important. Contact a health care provider if:  You have less than 10 mL of drainage a day for 2-3 days in a row, or as directed by your health care provider.  You have more redness, swelling, or pain around your incision area.  You have more fluid or blood coming from your incision area.  Your incision area feels warm to the touch.  You have pus or a bad smell coming from your incision area.  You have fluid leaking from around your catheter (instead of through your catheter).  You have a fever or chills.  You have pain that does not get better with medicine. Get help right away if:  Your catheter comes out.  You suddenly stop having drainage from your catheter.  You suddenly have blood in the fluid that is draining from your catheter.  You become dizzy or you faint.  You develop a rash.  You have nausea or vomiting.  You have difficulty breathing or you feel short of breath.  You develop chest pain.  You have problems with your speech or vision.  You have trouble balancing or moving your arms or legs. Summary  It is common to have a small amount of bruising and discomfort in the area where the drainage tube (catheter) was placed.  You may be directed to record the amount of drainage from the bag every time you empty it.  Follow instructions from your health care provider about emptying and cleaning your catheter and  collection bag. This information is not intended to replace advice given to you by your health care provider. Make sure you discuss any questions you have with your health care provider. Document Released: 09/07/2013 Document Revised: 03/15/2016 Document Reviewed: 03/15/2016 Elsevier Interactive Patient Education  2017 [employer].

## 2017-07-17 NOTE — Progress Notes (Signed)
Referring Physician(s): Dr. Redmond Pulling  Supervising Physician: Arne Cleveland  Patient Status:  [facility] - In-pt  Chief Complaint:  Intra-abdominal fluid collection  Subjective:  Ready to go home.    Allergies: Penicillins and Sulfonamide derivatives  Medications: Prior to Admission medications   Medication Sig Start Date End Date Taking? Authorizing Provider  aspirin 81 MG tablet Take 81 mg by mouth daily.     Yes [provider]  Cholecalciferol (VITAMIN D PO) Take by mouth.   Yes [provider]  ipratropium (ATROVENT) 0.06 % nasal spray Place 2 sprays into both nostrils every 4 (four) hours as needed for rhinitis. 04/23/16  Yes Gregor Hams, MD  levocetirizine (XYZAL) 5 MG tablet Take 1 tablet (5 mg total) by mouth every evening. 08/14/16  Yes Silverio Decamp, MD  montelukast (SINGULAIR) 10 MG tablet Take 1 tablet (10 mg total) by mouth at bedtime. 05/06/17  Yes Silverio Decamp, MD  Multiple Vitamin (MULTIVITAMIN) tablet Take 1 tablet by mouth daily.   Yes [provider]  Omega-3 Fatty Acids (FISH OIL) 1200 MG CAPS Take 1 capsule by mouth daily.   Yes [provider]  ondansetron (ZOFRAN) 8 MG tablet Take 1 tablet (8 mg total) by mouth every 8 (eight) hours as needed for nausea or vomiting. 07/08/17  Yes Gregor Hams, MD  rosuvastatin (CRESTOR) 40 MG tablet Take 1 tablet (40 mg total) by mouth daily. 05/06/17  Yes Branch, Alphonse Guild, MD  acetaminophen (TYLENOL) 325 MG tablet Take 2 tablets (650 mg total) by mouth every 6 (six) hours as needed for mild pain (or Fever >/= 101). 07/17/17   Rayburn, Floyce Stakes, PA-C  ciprofloxacin (CIPRO) 500 MG tablet Take 1 tablet (500 mg total) by mouth 2 (two) times daily for 14 days. 07/17/17 07/31/17  Rayburn, Claiborne Billings A, PA-C  lidocaine (XYLOCAINE) 2 % solution Use as directed 10 mLs in the mouth or throat every 3 (three) hours as needed (throat pain). 01/17/17   Trixie Dredge, PA-C    metroNIDAZOLE (FLAGYL) 500 MG tablet Take 1 tablet (500 mg total) by mouth 2 (two) times daily with a meal for 14 days. DO NOT CONSUME ALCOHOL WHILE TAKING THIS MEDICATION. 07/17/17 07/31/17  Rayburn, Floyce Stakes, PA-C     Vital Signs: BP 104/70 (BP Location: Right Arm)   Pulse 94   Temp 98.2 F (36.8 C) (Oral)   Resp 15   Ht 6\' 3"  (1.905 m)   Wt 223 lb 8.7 oz (101.4 kg)   SpO2 98%   BMI 27.94 kg/m   Physical Exam  Constitutional: He appears well-developed.  Cardiovascular: Normal rate and regular rhythm.  Abdominal: Normal appearance and bowel sounds are normal. He exhibits no distension.  Drain in place.  Brown, feculent-appearing output.  Overall a small amount of daily drainage.  Skin site c/d/i.  Nursing note and vitals reviewed.   Imaging: Ct Abdomen Pelvis W Contrast  Result Date: 07/16/2017 CLINICAL DATA:  Diverticular abscess. EXAM: CT ABDOMEN AND PELVIS WITH CONTRAST TECHNIQUE: Multidetector CT imaging of the abdomen and pelvis was performed using the standard protocol following bolus administration of intravenous contrast. CONTRAST:  151mL ISOVUE-300 IOPAMIDOL (ISOVUE-300) INJECTION 61% COMPARISON:  CT scan of July 11, 2017. FINDINGS: Lower chest: No acute abnormality. Hepatobiliary: No focal liver abnormality is seen. No gallstones, gallbladder wall thickening, or biliary dilatation. Pancreas: Unremarkable. No pancreatic ductal dilatation or surrounding inflammatory changes. Spleen: Normal in size without focal abnormality. Adrenals/Urinary Tract: Adrenal glands  are unremarkable. Kidneys are normal, without renal calculi, focal lesion, or hydronephrosis. Bladder is unremarkable. Stomach/Bowel: The stomach and appendix are unremarkable. There is no evidence of bowel obstruction. Sigmoid diverticulosis is noted. Inflammatory changes are noted consistent with diverticulitis. There is been interval placement of percutaneous drainage catheter which was placed into para diverticular  abscess noted on prior exam. No definite fluid collection is noted in the abscess currently. Vascular/Lymphatic: No significant vascular findings are present. No enlarged abdominal or pelvic lymph nodes. Reproductive: Prostate is unremarkable. Other: No definite hernia is noted. Musculoskeletal: No acute or significant osseous findings. IMPRESSION: Status post percutaneous drainage catheter placement for treatment of para diverticular abscess in the pelvis. No significant residual fluid collection remains. Findings consistent with sigmoid diverticulitis remain. Electronically Signed   By: Marijo Conception, M.D.   On: 07/16/2017 13:01    Labs:  CBC: Recent Labs    07/14/17 0441 07/15/17 0355 07/16/17 0452 07/17/17 0438  WBC 9.9 11.7* 12.5* 10.3  HGB 14.4 15.2 15.5 15.0  HCT 43.1 43.7 44.6 44.7  PLT 313 361 405* 382    COAGS: Recent Labs    07/12/17 0737  INR 1.19    BMP: Recent Labs    07/14/17 0441 07/15/17 0355 07/16/17 0452 07/17/17 0438  NA 137 137 136 137  K 4.1 4.2 4.1 4.1  CL 105 106 105 108  CO2 19* 22 21* 19*  GLUCOSE 97 115* 112* 110*  BUN 8 5* 6 9  CALCIUM 9.0 9.1 9.2 9.0  CREATININE 0.89 0.91 0.89 0.91  GFRNONAA >60 >60 >60 >60  GFRAA >60 >60 >60 >60    LIVER FUNCTION TESTS: Recent Labs    04/02/17 1521 07/08/17 1515 07/11/17 1155 07/11/17 1900  BILITOT 0.6 0.4 0.4 0.6  AST 31 19 22 28   ALT 38 33 30 33  ALKPHOS  --   --   --  71  PROT 7.7 7.4 7.7 7.4  ALBUMIN  --   --   --  3.8    Assessment and Plan: Intra-abdominal fluid collection s/p drain placement 3/8. Patient to discharge home today.  Due to output appearance, will keep to gravity bag.  No flushes.  Have patient come to IR drain clinic in 2 weeks for injection and CT scan.  Order in place.   Electronically Signed: Docia Barrier, PA 07/17/2017, 10:44 AM   I spent a total of 15 Minutes at the the patient's bedside AND on the patient's hospital floor or unit, greater than  50% of which was counseling/coordinating care for intra-abdominal abscess.

## 2017-07-17 NOTE — Discharge Summary (Signed)
[facility] Discharge Summary   Patient ID: [name] MRN: [id number] DOB/AGE: [birth year] 53 y.o.  Admit date: 07/11/2017 Discharge date: 07/17/2017  Admitting Diagnosis: Diverticulitis with abscess  Discharge Diagnosis Patient Active Problem List   Diagnosis Date Noted  . Diverticulitis of large intestine with abscess without bleeding 07/11/2017  . Chronic cough 07/07/2015  . Prostate nodule 05/19/2015  . Hx of adenomatous colonic polyps 09/02/2014  . Annual physical exam 05/20/2014  . Allergic rhinitis 11/17/2013  . Erectile dysfunction   . Sleep apnea   . CAD (coronary artery disease) of artery bypass graft   . Essential hypertension, benign 07/05/2009  . Hyperlipidemia 01/18/2009    Consultants Interventional radiology Internal medicine  Imaging: Ct Abdomen Pelvis W Contrast  Result Date: 07/16/2017 CLINICAL DATA:  Diverticular abscess. EXAM: CT ABDOMEN AND PELVIS WITH CONTRAST TECHNIQUE: Multidetector CT imaging of the abdomen and pelvis was performed using the standard protocol following bolus administration of intravenous contrast. CONTRAST:  126mL ISOVUE-300 IOPAMIDOL (ISOVUE-300) INJECTION 61% COMPARISON:  CT scan of July 11, 2017. FINDINGS: Lower chest: No acute abnormality. Hepatobiliary: No focal liver abnormality is seen. No gallstones, gallbladder wall thickening, or biliary dilatation. Pancreas: Unremarkable. No pancreatic ductal dilatation or surrounding inflammatory changes. Spleen: Normal in size without focal abnormality. Adrenals/Urinary Tract: Adrenal glands are unremarkable. Kidneys are normal, without renal calculi, focal lesion, or hydronephrosis. Bladder is unremarkable. Stomach/Bowel: The stomach and appendix are unremarkable. There is no evidence of bowel obstruction. Sigmoid diverticulosis is noted. Inflammatory changes are noted consistent with diverticulitis. There is been interval placement of percutaneous drainage catheter which was  placed into para diverticular abscess noted on prior exam. No definite fluid collection is noted in the abscess currently. Vascular/Lymphatic: No significant vascular findings are present. No enlarged abdominal or pelvic lymph nodes. Reproductive: Prostate is unremarkable. Other: No definite hernia is noted. Musculoskeletal: No acute or significant osseous findings. IMPRESSION: Status post percutaneous drainage catheter placement for treatment of para diverticular abscess in the pelvis. No significant residual fluid collection remains. Findings consistent with sigmoid diverticulitis remain. Electronically Signed   By: Marijo Conception, M.D.   On: 07/16/2017 13:01    Procedures Dr. Vernard Gambles (07/12/17) - percutaneous drainage catheter placement  Hospital Course:  Patient is a 52 year old male who presented to [facility] with abdominal pain.  Workup showed diverticular abscess.  Patient was admitted to medicine due to history of CAD initially and then later transferred to surgical service. Tolerated procedure well and was transferred to the floor.  Diet was advanced as tolerated. Repeat CT 3/12 showed resolution in abscess, but due to character of drainage drain was left in place at discharge. Patient discharge on 2 weeks PO antibiotics.   On 07/17/17, the patient was voiding well, tolerating diet, ambulating well, pain well controlled, vital signs stable, drain with brown drainage and felt stable for discharge home.  Patient will follow up in drain clinic in 2 weeks and knows to call with questions or concerns. He will otherwise follow up as outlined below.   Physical Exam: General:  Alert, NAD, pleasant, comfortable Card: Regular rate and rhythm, pedal pulses 2+ BL Pulm: Normal effort, clear to auscultation bilaterally Abd:  Soft, ND, mild tenderness, drain with minimal brown foul-smelling drainage Skin: warm and dry, no rashes  Psych: A&Ox3    Allergies as of 07/17/2017      Reactions   Penicillins     Unsure of reaction Tolerated Omnicef 03/2017.   Sulfonamide Derivatives  REACTION: rash,SOB,hives      Medication List    TAKE these medications   acetaminophen 325 MG tablet Commonly known as:  TYLENOL Take 2 tablets (650 mg total) by mouth every 6 (six) hours as needed for mild pain (or Fever >/= 101).   aspirin 81 MG tablet Take 81 mg by mouth daily.   ciprofloxacin 500 MG tablet Commonly known as:  CIPRO Take 1 tablet (500 mg total) by mouth 2 (two) times daily for 14 days.   Fish Oil 1200 MG Caps Take 1 capsule by mouth daily.   ipratropium 0.06 % nasal spray Commonly known as:  ATROVENT Place 2 sprays into both nostrils every 4 (four) hours as needed for rhinitis.   levocetirizine 5 MG tablet Commonly known as:  XYZAL Take 1 tablet (5 mg total) by mouth every evening.   lidocaine 2 % solution Commonly known as:  XYLOCAINE Use as directed 10 mLs in the mouth or throat every 3 (three) hours as needed (throat pain).   metroNIDAZOLE 500 MG tablet Commonly known as:  FLAGYL Take 1 tablet (500 mg total) by mouth 2 (two) times daily with a meal for 14 days. DO NOT CONSUME ALCOHOL WHILE TAKING THIS MEDICATION.   montelukast 10 MG tablet Commonly known as:  SINGULAIR Take 1 tablet (10 mg total) by mouth at bedtime.   multivitamin tablet Take 1 tablet by mouth daily.   ondansetron 8 MG tablet Commonly known as:  ZOFRAN Take 1 tablet (8 mg total) by mouth every 8 (eight) hours as needed for nausea or vomiting.   rosuvastatin 40 MG tablet Commonly known as:  CRESTOR Take 1 tablet (40 mg total) by mouth daily.   VITAMIN D PO Take by mouth.        Follow-up Information    Ileana Roup, MD. Go on 09/10/2017.   Specialty:  General Surgery Why:  Your appointment is at 9:00 AM. Please arrive 30 min prior to appointment time. Bring photo ID and insurance information.  Contact information: [place] [phone]         Silverio Decamp, MD. Call.   Specialties:  Family Medicine, Sports Medicine, Radiology Why:  Call and make an appointment to be seen in 2-3 weeks for after hospital visit.  Contact information: [place] [address] [phone]        Arne Cleveland, MD Follow up.   Specialties:  Interventional Radiology, Radiology Why:  Schedulers will call you with date and time of appointment.  Expect appointment in approximately 2 weeks.  Contact information: [address] [place] [address] [phone]           Signed: Brigid Re, [facility] 07/17/2017, 12:38 PM Pager: [phone] Consults: [phone] Mon-Fri 7:00 am-4:30 pm Sat-Sun 7:00 am-11:30 am

## 2017-07-30 ENCOUNTER — Ambulatory Visit
Admission: RE | Admit: 2017-07-30 | Discharge: 2017-07-30 | Disposition: A | Discharge status: Home / Self Care | Payer: No Typology Code available for payment source | Source: Ambulatory Visit | Attending: Student | Admitting: Student

## 2017-07-30 ENCOUNTER — Encounter: Payer: Self-pay | Admitting: Radiology

## 2017-07-30 ENCOUNTER — Ambulatory Visit
Admission: RE | Admit: 2017-07-30 | Discharge: 2017-07-30 | Disposition: A | Discharge status: Home / Self Care | Payer: No Typology Code available for payment source | Source: Ambulatory Visit | Attending: Surgery | Admitting: Surgery

## 2017-07-30 DIAGNOSIS — K5720 Diverticulitis of large intestine with perforation and abscess without bleeding: Secondary | ICD-10-CM

## 2017-07-30 HISTORY — PX: IR RADIOLOGIST EVAL & MGMT: IMG5224

## 2017-07-30 MED ORDER — IOPAMIDOL (ISOVUE-300) INJECTION 61%
125.0000 mL | Freq: Once | INTRAVENOUS | Status: AC | PRN
  Administered 2017-07-30: 125 mL via INTRAVENOUS

## 2017-07-30 NOTE — Progress Notes (Signed)
Patient ID: [name], male   DOB: 15-Feb-1966, 52 y.o.   MRN: [id number]       Chief Complaint:  Diverticular abscess, status post percutaneous drain, outpatient follow-up  Referring Physician(s): Matthews,Kacie Sue-Ellen  History of Present Illness: [name] is a 52 y.o. male who is approximately 2 weeks status post percutaneous drain for a right lower quadrant diverticular abscess.  He returns for outpatient imaging and follow-up.  Overall he is feeling much better.  No significant abdominal or pelvic pain.  No fevers.  Normal bowel habits.  Stable weight and appetite.  No significant drain output.  He continues on oral antibiotics.  He has outpatient surgical follow-up scheduled.  Past Medical History:  Diagnosis Date  . CAD (coronary artery disease) of artery bypass graft 2009   Status post bypass grafting 2009 , status post stress echo 2012 no ischemia.  . Dermatitis    Left upper extremity  . Diverticulitis   . Erectile dysfunction   . Hx of adenomatous colonic polyps 09/02/2014  . Hyperlipidemia, mixed   . Hypotension, unspecified   . Myocardial infarction ([place])    per pt had 2 MI  . Sleep apnea    On 8 cm of water CPAP    Past Surgical History:  Procedure Laterality Date  . CORONARY ARTERY BYPASS GRAFT  2009  . IR RADIOLOGIST EVAL & MGMT  07/30/2017    Allergies: Penicillins and Sulfonamide derivatives  Medications: Prior to Admission medications   Medication Sig Start Date End Date Taking? Authorizing Provider  acetaminophen (TYLENOL) 325 MG tablet Take 2 tablets (650 mg total) by mouth every 6 (six) hours as needed for mild pain (or Fever >/= 101). 07/17/17   Rayburn, Floyce Stakes, PA-C  aspirin 81 MG tablet Take 81 mg by mouth daily.      [provider]  Cholecalciferol (VITAMIN D PO) Take by mouth.    [provider]  ciprofloxacin (CIPRO) 500 MG tablet Take 1 tablet (500 mg total) by mouth 2 (two) times daily for 14 days. 07/17/17  07/31/17  Rayburn, Claiborne Billings A, PA-C  ipratropium (ATROVENT) 0.06 % nasal spray Place 2 sprays into both nostrils every 4 (four) hours as needed for rhinitis. 04/23/16   Gregor Hams, MD  levocetirizine (XYZAL) 5 MG tablet Take 1 tablet (5 mg total) by mouth every evening. 08/14/16   Silverio Decamp, MD  lidocaine (XYLOCAINE) 2 % solution Use as directed 10 mLs in the mouth or throat every 3 (three) hours as needed (throat pain). 01/17/17   Trixie Dredge, PA-C  metroNIDAZOLE (FLAGYL) 500 MG tablet Take 1 tablet (500 mg total) by mouth 2 (two) times daily with a meal for 14 days. DO NOT CONSUME ALCOHOL WHILE TAKING THIS MEDICATION. 07/17/17 07/31/17  Rayburn, Floyce Stakes, PA-C  montelukast (SINGULAIR) 10 MG tablet Take 1 tablet (10 mg total) by mouth at bedtime. 05/06/17   Silverio Decamp, MD  Multiple Vitamin (MULTIVITAMIN) tablet Take 1 tablet by mouth daily.    [provider]  Omega-3 Fatty Acids (FISH OIL) 1200 MG CAPS Take 1 capsule by mouth daily.    [provider]  ondansetron (ZOFRAN) 8 MG tablet Take 1 tablet (8 mg total) by mouth every 8 (eight) hours as needed for nausea or vomiting. 07/08/17   Gregor Hams, MD  rosuvastatin (CRESTOR) 40 MG tablet Take 1 tablet (40 mg total) by mouth daily. 05/06/17   Arnoldo Lenis, MD  Family History  Problem Relation Age of Onset  . Heart disease Mother   . Heart disease Father   . Colon cancer Paternal Uncle   . Kidney disease Other   . Coronary artery disease Other   . Heart failure Other   . Esophageal cancer Neg Hx   . Prostate cancer Neg Hx   . Rectal cancer Neg Hx   . Stomach cancer Neg Hx     Social History   Socioeconomic History  . Marital status: Married    Spouse name: Not on file  . Number of children: Not on file  . Years of education: Not on file  . Highest education level: Not on file  Occupational History  . Occupation: Full time    Employer: [employer]  Social Needs  .  Financial resource strain: Not on file  . Food insecurity:    Worry: Not on file    Inability: Not on file  . Transportation needs:    Medical: Not on file    Non-medical: Not on file  Tobacco Use  . Smoking status: Former Smoker    Packs/day: 0.20    Years: 5.00    Pack years: 1.00    Types: Cigarettes, Cigars    Start date: 05/28/2000    Last attempt to quit: 05/07/2005    Years since quitting: 12.2  . Smokeless tobacco: Former Systems developer    Types: Chew    Quit date: 05/07/1997  . Tobacco comment: chewed tobacco x 20 yrs - quit 2000  Substance and Sexual Activity  . Alcohol use: Yes    Alcohol/week: 9.0 oz    Types: 5 Standard drinks or equivalent, 10 Shots of liquor per week    Comment: beer & liquor  . Drug use: No  . Sexual activity: Yes    Partners: Female  Lifestyle  . Physical activity:    Days per week: Not on file    Minutes per session: Not on file  . Stress: Not on file  Relationships  . Social connections:    Talks on phone: Not on file    Gets together: Not on file    Attends religious service: Not on file    Active member of club or organization: Not on file    Attends meetings of clubs or organizations: Not on file    Relationship status: Not on file  Other Topics Concern  . Not on file  Social History Narrative  . Not on file      Review of Systems: A 12 point ROS discussed and pertinent positives are indicated in the HPI above.  All other systems are negative.  Review of Systems  Vital Signs: BP 116/74   Pulse 79   Temp 98 F (36.7 C)   SpO2 98%   Physical Exam  Constitutional: He appears well-developed and well-nourished. No distress.  Abdominal: Soft. Bowel sounds are normal. He exhibits no distension.  Right lower quadrant drain catheter site is clean, dry and intact.  Minimal serous drainage in the gravity bag.  Skin: He is not diaphoretic.      Imaging: Dg Chest 2 View  Result Date: 07/11/2017 CLINICAL DATA:  Sepsis. The patient had a  CT in [place], and was told to come straight to the hospital. History of CAD, hypertension, MI. EXAM: CHEST - 2 VIEW COMPARISON:  10/19/2016 FINDINGS: Status post median sternotomy and CABG. The heart is normal in size. No focal consolidations or pleural effusions. No pulmonary  edema. Contrast is identified within the colon. IMPRESSION: No active cardiopulmonary disease. Electronically Signed   By: Nolon Nations M.D.   On: 07/11/2017 20:14   Ct Abdomen Pelvis W Contrast  Result Date: 07/30/2017 CLINICAL DATA:  Diverticular abscess, status post percutaneous drain right lower quadrant, outpatient follow-up EXAM: CT ABDOMEN AND PELVIS WITH CONTRAST TECHNIQUE: Multidetector CT imaging of the abdomen and pelvis was performed using the standard protocol following bolus administration of intravenous contrast. CONTRAST:  181mL ISOVUE-300 IOPAMIDOL (ISOVUE-300) INJECTION 61% COMPARISON:  07/16/2017, 07/12/2017 FINDINGS: Lower chest: Minor basilar atelectasis. Native coronary atherosclerosis. Normal heart size. No pericardial or pleural effusion. Hepatobiliary: Mild diffuse hepatic hypoattenuation compatible with fatty infiltration or hepatic steatosis. No focal hepatic abnormality. Gallbladder and biliary system unremarkable. Patent portal vein. Pancreas: Unremarkable. No pancreatic ductal dilatation or surrounding inflammatory changes. Spleen: Normal in size without focal abnormality. Adrenals/Urinary Tract: Adrenal glands are unremarkable. Kidneys are normal, without renal calculi, focal lesion, or hydronephrosis. Bladder is unremarkable. Stomach/Bowel: Negative for bowel obstruction, significant dilatation, ileus, or free air. Normal appendix. Right lower quadrant diverticular abscess drain noted. Stable drain catheter position along the sigmoid colon. No residual fluid collection or abscess. Surrounding inflammation/edema has resolved. Residual scattered colonic diverticulosis. No new fluid collection or  abscess. Vascular/Lymphatic: Minor aortic atherosclerosis. No significant aneurysm or occlusive process. No dissection or retroperitoneal abnormality. No adenopathy. Reproductive: Prostate normal in size. No other significant finding. Other: No abdominal wall hernia or abnormality. No abdominopelvic ascites. Musculoskeletal: No acute osseous finding. IMPRESSION: Resolved right lower quadrant diverticular abscess, status post percutaneous drain. Stable drain catheter position. No other acute abdominopelvic finding. Electronically Signed   By: Jerilynn Mages.  Julez Huseby M.D.   On: 07/30/2017 10:34   Ct Abdomen Pelvis W Contrast  Result Date: 07/16/2017 CLINICAL DATA:  Diverticular abscess. EXAM: CT ABDOMEN AND PELVIS WITH CONTRAST TECHNIQUE: Multidetector CT imaging of the abdomen and pelvis was performed using the standard protocol following bolus administration of intravenous contrast. CONTRAST:  147mL ISOVUE-300 IOPAMIDOL (ISOVUE-300) INJECTION 61% COMPARISON:  CT scan of July 11, 2017. FINDINGS: Lower chest: No acute abnormality. Hepatobiliary: No focal liver abnormality is seen. No gallstones, gallbladder wall thickening, or biliary dilatation. Pancreas: Unremarkable. No pancreatic ductal dilatation or surrounding inflammatory changes. Spleen: Normal in size without focal abnormality. Adrenals/Urinary Tract: Adrenal glands are unremarkable. Kidneys are normal, without renal calculi, focal lesion, or hydronephrosis. Bladder is unremarkable. Stomach/Bowel: The stomach and appendix are unremarkable. There is no evidence of bowel obstruction. Sigmoid diverticulosis is noted. Inflammatory changes are noted consistent with diverticulitis. There is been interval placement of percutaneous drainage catheter which was placed into para diverticular abscess noted on prior exam. No definite fluid collection is noted in the abscess currently. Vascular/Lymphatic: No significant vascular findings are present. No enlarged abdominal or pelvic  lymph nodes. Reproductive: Prostate is unremarkable. Other: No definite hernia is noted. Musculoskeletal: No acute or significant osseous findings. IMPRESSION: Status post percutaneous drainage catheter placement for treatment of para diverticular abscess in the pelvis. No significant residual fluid collection remains. Findings consistent with sigmoid diverticulitis remain. Electronically Signed   By: Marijo Conception, M.D.   On: 07/16/2017 13:01   Ct Abdomen Pelvis W Contrast  Result Date: 07/11/2017 CLINICAL DATA:  Lower abdominal pain for 3 weeks. EXAM: CT ABDOMEN AND PELVIS WITH CONTRAST TECHNIQUE: Multidetector CT imaging of the abdomen and pelvis was performed using the standard protocol following bolus administration of intravenous contrast. CONTRAST:  122mL ISOVUE-300 IOPAMIDOL (ISOVUE-300) INJECTION 61% COMPARISON:  None. FINDINGS: Lower  chest: No acute abnormality. Hepatobiliary: No focal liver abnormality is seen. No gallstones, gallbladder wall thickening, or biliary dilatation. Pancreas: Unremarkable. No pancreatic ductal dilatation or surrounding inflammatory changes. Spleen: Normal in size without focal abnormality. Adrenals/Urinary Tract: Adrenal glands are unremarkable. Kidneys are normal, without renal calculi, focal lesion, or hydronephrosis. Bladder is unremarkable. Stomach/Bowel: The stomach and appendix appear normal. There is no evidence of bowel obstruction. Sigmoid diverticulitis is noted with 3.7 x 2.8 cm abscess or contained perforation. Vascular/Lymphatic: No significant vascular findings are present. No enlarged abdominal or pelvic lymph nodes. Reproductive: Prostate is unremarkable. Other: No abdominal wall hernia or abnormality. No abdominopelvic ascites. Musculoskeletal: No acute or significant osseous findings. IMPRESSION: Acute sigmoid diverticulitis is noted. There is 3.7 x 2.8 cm adjacent fluid collection concerning for abscess or contained perforation. These results will be  called to the ordering clinician or representative by the Radiologist Assistant, and communication documented in the PACS or zVision Dashboard. Electronically Signed   By: Marijo Conception, M.D.   On: 07/11/2017 15:32   Dg Sinus/fist Tube Chk-non Gi  Result Date: 07/30/2017 INDICATION: Right lower quadrant diverticular abscess EXAM: FLUOROSCOPIC ABSCESS DRAIN INJECTION MEDICATIONS: The patient is on oral antibiotics as an outpatient ANESTHESIA/SEDATION: None. COMPLICATIONS: None immediate. PROCEDURE: Under sterile conditions, the existing right lower quadrant abscess drain was injected with contrast. Fluoroscopic imaging performed. Stable abscess drain position. Abscess cavity remains collapsed. Negative for fistula to the adjacent sigmoid. This correlates with the CT. IMPRESSION: Collapsed resolved abscess cavity.  Negative for colonic fistula. PLAN: Drain catheter can be removed. He has outpatient surgical follow-up scheduled in the next few weeks. Electronically Signed   By: Jerilynn Mages.  Braxtyn Dorff M.D.   On: 07/30/2017 11:19   Ct Image Guided Drainage By Percutaneous Catheter  Result Date: 07/12/2017 CLINICAL DATA:  Diverticular abscess EXAM: CT GUIDED DRAINAGE OF RIGHT PELVIC ABSCESS ANESTHESIA/SEDATION: Intravenous Fentanyl and Versed were administered as conscious sedation during continuous monitoring of the patient's level of consciousness and physiological / cardiorespiratory status by the radiology RN, with a total moderate sedation time of 13 minutes. PROCEDURE: The procedure, risks, benefits, and alternatives were explained to the patient. Questions regarding the procedure were encouraged and answered. The patient understands and consents to the procedure. Select axial scans through the lower abdomen and pelvis obtained. The collection was localized and an appropriate skin entry site was determined and marked. The operative field was prepped with chlorhexidinein a sterile fashion, and a sterile drape was applied  covering the operative field. A sterile gown and sterile gloves were used for the procedure. Local anesthesia was provided with 1% Lidocaine. Under CT fluoroscopic guidance, an 18 gauge trocar needle was advanced into the collection. Viscous green opaque fluid could be aspirated. An Amplatz guidewire advanced easily within the collection, its position confirmed on CT. Tract dilated to facilitate placement of a 12 French pigtail drain catheter, formed centrally within the collection. Approximately 10 mL of purulent material were aspirated sent for Gram stain and culture. The catheter was secured externally with 0 Prolene suture and StatLock, flushed, and placed to gravity drain bag. The patient tolerated the procedure well. COMPLICATIONS: None immediate FINDINGS: The right lower quadrant/pelvic collection was again localized. An appropriate safe approach was identified. 12 French pigtail drain catheter placed. Sample of the aspirate sent for Gram stain and culture. IMPRESSION: 1. Technically successful CT-guided right pelvic abscess drain catheter placement. Electronically Signed   By: Lucrezia Europe M.D.   On: 07/12/2017 09:49   Ir Radiologist  Eval & Mgmt  Result Date: 07/30/2017 Please refer to notes tab for details about interventional procedure. (Op Note)   Labs:  CBC: Recent Labs    07/14/17 0441 07/15/17 0355 07/16/17 0452 07/17/17 0438  WBC 9.9 11.7* 12.5* 10.3  HGB 14.4 15.2 15.5 15.0  HCT 43.1 43.7 44.6 44.7  PLT 313 361 405* 382    COAGS: Recent Labs    07/12/17 0737  INR 1.19    BMP: Recent Labs    07/14/17 0441 07/15/17 0355 07/16/17 0452 07/17/17 0438  NA 137 137 136 137  K 4.1 4.2 4.1 4.1  CL 105 106 105 108  CO2 19* 22 21* 19*  GLUCOSE 97 115* 112* 110*  BUN 8 5* 6 9  CALCIUM 9.0 9.1 9.2 9.0  CREATININE 0.89 0.91 0.89 0.91  GFRNONAA >60 >60 >60 >60  GFRAA >60 >60 >60 >60    LIVER FUNCTION TESTS: Recent Labs    04/02/17 1521 07/08/17 1515 07/11/17 1155  07/11/17 1900  BILITOT 0.6 0.4 0.4 0.6  AST 31 19 22 28   ALT 38 33 30 33  ALKPHOS  --   --   --  71  PROT 7.7 7.4 7.7 7.4  ALBUMIN  --   --   --  3.8    TUMOR MARKERS: No results for input(s): AFPTM, CEA, CA199, CHROMGRNA in the last 8760 hours.  Assessment and Plan:  Resolved diverticular abscess by CT.  Negative for colonic fistula by fluoroscopic injection.  Plan: Drain catheter will be removed today.  Continue oral antibiotics and maintain outpatient surgical follow-up.   Electronically Signed: Greggory Keen 07/30/2017, 1:13 PM   I spent a total of    25 Minutes in face to face in clinical consultation, greater than 50% of which was counseling/coordinating care for this patient with a right lower quadrant diverticular abscess with

## 2017-08-05 ENCOUNTER — Encounter: Payer: Self-pay | Admitting: Internal Medicine

## 2017-08-22 ENCOUNTER — Other Ambulatory Visit: Payer: Self-pay | Admitting: Sports Medicine

## 2017-08-22 DIAGNOSIS — J0110 Acute frontal sinusitis, unspecified: Secondary | ICD-10-CM

## 2017-09-10 ENCOUNTER — Encounter: Payer: Self-pay | Admitting: Internal Medicine

## 2017-09-18 ENCOUNTER — Ambulatory Visit: Payer: No Typology Code available for payment source | Admitting: Cardiology

## 2017-09-20 ENCOUNTER — Telehealth: Payer: Self-pay | Admitting: *Deleted

## 2017-09-20 NOTE — Telephone Encounter (Signed)
noted 

## 2017-09-20 NOTE — Telephone Encounter (Signed)
Dr. Carlean Purl,  Could you please review this pt's recent episode of diverticulitis in March of 2019?  I just wanted to make ok for direct colonoscopy or if you wanted an OV first.  His previsit is this coming Monday  Thanks, [employer]

## 2017-09-20 NOTE — Telephone Encounter (Signed)
Direct ok

## 2017-09-23 ENCOUNTER — Ambulatory Visit (AMBULATORY_SURGERY_CENTER): Payer: Self-pay | Admitting: *Deleted

## 2017-09-23 ENCOUNTER — Other Ambulatory Visit: Payer: Self-pay

## 2017-09-23 VITALS — Ht 75.0 in | Wt 228.0 lb

## 2017-09-23 DIAGNOSIS — Z86010 Personal history of colonic polyps: Secondary | ICD-10-CM

## 2017-09-23 NOTE — Progress Notes (Signed)
No egg or soy allergy known to patient  No issues with past sedation with any surgeries  or procedures, no intubation problems  No diet pills per patient No home 02 use per patient  No blood thinners per patient  Pt denies issues with constipation  No A fib or A flutter  EMMI video sent to pt's e mail - pt declined  Pt to see cardio 5-29 WED for cardiac clearance for colon surgery - pt instrycted toa sk about colon 6-3 as well and call PV after OV WED 5-29- placed in book in PV 50 as well to follow up

## 2017-10-01 ENCOUNTER — Ambulatory Visit: Payer: No Typology Code available for payment source | Admitting: Cardiology

## 2017-10-02 ENCOUNTER — Encounter

## 2017-10-02 ENCOUNTER — Ambulatory Visit (INDEPENDENT_AMBULATORY_CARE_PROVIDER_SITE_OTHER): Payer: No Typology Code available for payment source | Admitting: Cardiology

## 2017-10-02 ENCOUNTER — Encounter: Payer: Self-pay | Admitting: Cardiology

## 2017-10-02 ENCOUNTER — Telehealth: Payer: Self-pay | Admitting: Internal Medicine

## 2017-10-02 VITALS — BP 118/75 | HR 85 | Ht 75.0 in | Wt 228.4 lb

## 2017-10-02 DIAGNOSIS — Z01810 Encounter for preprocedural cardiovascular examination: Secondary | ICD-10-CM

## 2017-10-02 DIAGNOSIS — I2510 Atherosclerotic heart disease of native coronary artery without angina pectoris: Secondary | ICD-10-CM | POA: Diagnosis not present

## 2017-10-02 DIAGNOSIS — E782 Mixed hyperlipidemia: Secondary | ICD-10-CM | POA: Diagnosis not present

## 2017-10-02 NOTE — Patient Instructions (Signed)

## 2017-10-02 NOTE — Progress Notes (Signed)
Clinical Summary [name] is a 52 y.o.male seen today for follow up of the following medical problems.   1. CAD - CABG in 2009 4 vessel (LIMA-LAD, left radial to OM, SVG-diag, SVG-RCA) - 2012 DSE with baseline LVEF 55-60%, no ischemia - no chest pain. No SOB or DOE.  -meds limited due to low bp's, has not been on beta blocker or ACE-I   - no recent chest pain. No SOB/DOE - compliant with meds.   2. Hyperlipidemia - she is compliant with staitn - 09/2016 TC 122 TG 169 HDL 32 LDL 56   3. OSA - on CPAP  4. Preoperative evaluation - plans for GI surgery related to diverticular abscess - works at [employer]. Works loading and unloading trucks. Denies any cardiopulmonary symptoms with activities.   - can walk up a flight of stairs without troubles. Just moved daughter from colleg, walking up and down 6 flights of stairs several times without symptoms.    5. Diverticular abscess - had a drain recently.  - plans for Gi surgery  SH: works for [employer] Daughter at [employer] History:  Diagnosis Date  . Allergy   . CAD (coronary artery disease) of artery bypass graft 2009   Status post bypass grafting 2009 , status post stress echo 2012 no ischemia.  . Depression    past hx after heart surgery   . Dermatitis    Left upper extremity  . Diverticulitis   . Erectile dysfunction   . Hx of adenomatous colonic polyps 09/02/2014  . Hyperlipidemia, mixed   . Hypotension, unspecified   . Myocardial infarction [facility])    per pt had 2 MI- 2009 both   . Sleep apnea    On 8 cm of water CPAP     Allergies  Allergen Reactions  . Oxycodone Hives    fever  . Sulfonamide Derivatives     REACTION: rash,SOB,hives     Current Outpatient Medications  Medication Sig Dispense Refill  . acetaminophen (TYLENOL) 325 MG tablet Take 2 tablets (650 mg total) by mouth every 6 (six) hours as needed for mild pain (or Fever >/= 101).    [name] amoxicillin (AMOXIL)  125 MG chewable tablet Chew 125 mg by mouth 3 (three) times daily.    [name] aspirin 81 MG tablet Take 81 mg by mouth daily.      . Cholecalciferol (VITAMIN D PO) Take by mouth.    [name] ibuprofen (ADVIL,MOTRIN) 800 MG tablet Take 800 mg by mouth every 8 (eight) hours as needed.    [name] ipratropium (ATROVENT) 0.06 % nasal spray Place 2 sprays into both nostrils every 4 (four) hours as needed for rhinitis. 10 mL 6  . levocetirizine (XYZAL) 5 MG tablet Take 1 tablet (5 mg total) by mouth every evening. (Patient not taking: Reported on 09/23/2017) 30 tablet 3  . lidocaine (XYLOCAINE) 2 % solution Use as directed 10 mLs in the mouth or throat every 3 (three) hours as needed (throat pain). (Patient not taking: Reported on 09/23/2017) 100 mL 0  . montelukast (SINGULAIR) 10 MG tablet TAKE ONE TABLET DAILY AT BEDTIME 90 tablet 3  . Multiple Vitamin (MULTIVITAMIN) tablet Take 1 tablet by mouth daily.    . Omega-3 Fatty Acids (FISH OIL) 1200 MG CAPS Take 1 capsule by mouth daily.    . ondansetron (ZOFRAN) 8 MG tablet Take 1 tablet (8 mg total) by mouth every 8 (eight) hours as needed for  nausea or vomiting. (Patient not taking: Reported on 09/23/2017) 20 tablet 1  . rosuvastatin (CRESTOR) 40 MG tablet Take 1 tablet (40 mg total) by mouth daily. 90 tablet 3   No current facility-administered medications for this visit.      Past Surgical History:  Procedure Laterality Date  . COLONOSCOPY  2016  . CORONARY ARTERY BYPASS GRAFT  2009   x4  . IR RADIOLOGIST EVAL & MGMT  07/30/2017  . POLYPECTOMY  2016   TA x 5      Allergies  Allergen Reactions  . Oxycodone Hives    fever  . Sulfonamide Derivatives     REACTION: rash,SOB,hives      Family History  Problem Relation Age of Onset  . Heart disease Mother   . Heart disease Father   . Colon cancer Paternal Uncle   . Kidney disease Other   . Coronary artery disease Other   . Heart failure Other   . Esophageal cancer Neg Hx   . Prostate cancer Neg Hx   .  Rectal cancer Neg Hx   . Stomach cancer Neg Hx   . Colon polyps Neg Hx      Social History [name] reports that he quit smoking about 12 years ago. His smoking use included cigarettes and cigars. He started smoking about 17 years ago. He has a 1.00 pack-year smoking history. He quit smokeless tobacco use about 20 years ago. His smokeless tobacco use included chew. [name] reports that he drinks about 9.0 oz of alcohol per week.   Review of Systems CONSTITUTIONAL: No weight loss, fever, chills, weakness or fatigue.  HEENT: Eyes: No visual loss, blurred vision, double vision or yellow sclerae.No hearing loss, sneezing, congestion, runny nose or sore throat.  SKIN: No rash or itching.  CARDIOVASCULAR: per hpi RESPIRATORY: No shortness of breath, cough or sputum.  GASTROINTESTINAL: No anorexia, nausea, vomiting or diarrhea. No abdominal pain or blood.  GENITOURINARY: No burning on urination, no polyuria NEUROLOGICAL: No headache, dizziness, syncope, paralysis, ataxia, numbness or tingling in the extremities. No change in bowel or bladder control.  MUSCULOSKELETAL: No muscle, back pain, joint pain or stiffness.  LYMPHATICS: No enlarged nodes. No history of splenectomy.  PSYCHIATRIC: No history of depression or anxiety.  ENDOCRINOLOGIC: No reports of sweating, cold or heat intolerance. No polyuria or polydipsia.  [name]   Physical Examination Vitals:   10/02/17 1516  BP: 118/75  Pulse: 85  SpO2: 95%   Vitals:   10/02/17 1516  Weight: 228 lb 6.4 oz (103.6 kg)  Height: 6\' 3"  (1.905 m)    Gen: resting comfortably, no acute distress HEENT: no scleral icterus, pupils equal round and reactive, no palptable cervical adenopathy,  CV: RRR, no m/r/g, no jvd Resp: Clear to auscultation bilaterally GI: abdomen is soft, non-tender, non-distended, normal bowel sounds, no hepatosplenomegaly MSK: extremities are warm, no edema.  Skin: warm, no rash Neuro:  no focal deficits Psych:  appropriate affect   Diagnostic Studies 12/2007 ANGIOGRAPHIC DATA:  1. The left main is free of critical disease.  2. The LAD courses to the apex. The LAD has multiple lesions  including a 70% proximal mid and 80% lesion at the takeoff of the  third diagonal. There were then in the distal vessel multiple  lesions including a 90, 70, 50, 90, and 90 in sequence. There is a  70% apical stenosis as well. The first diagonal [name] is small-to-  moderate in size with 90% ostial narrowing. Second  diagonal is  fairly large in size with 80% proximal and 80% mid narrowing.  Second diagonal does appear to be graftable. The third diagonal  has 90% ostial narrowing.  3. The circumflex has a tiny second marginal with 90% narrowing that  was totally occluded and it started filling late by collaterals.  4. The right coronary artery is totally occluded proximally and fills  in by left-to-right collaterals.  5. Ventriculography in the RAO projection reveals estimated ejection  fraction of 45-50%. There is inferior wall motion abnormality in  the mid inferior wall corresponding the circumflex territory and  anteroapical abnormality, that is mild.  CONCLUSIONS:  1. Total occlusion of the circumflex and right coronary arteries.  2. Minimum 8 successive lesions in the left anterior descending  artery, and including high-grade disease of the large second  diagonal [name] as noted above.  3. Progressive symptoms compatible with angina pectoris.  RECOMMENDATIONS: The LAD is not ideal for grafting. Nonetheless, the  apical vessel possibly could be grafted as well as the large diagonal.  In addition, the marginal and the RCA also appear to be optimal for  grafting. Surgical consultation will be obtained.       Assessment and Plan  1. CAD - no symptoms, continue current meds - EKG today shows SR, no ischemic changes  2. Hyperlipidemia - at goal, continue statin  3.  Preoperative evaluation - being considered for abdominal surgery - no active acute cardiac conditions. Tolerates greater than 4 METs regularly without symptoms - recommend proceeding with surgery as planned.    F/u 1 year.       Arnoldo Lenis, M.D.

## 2017-10-02 NOTE — Telephone Encounter (Signed)
Pt states he saw cardio- they did ekg today - no changes from last year- no further testing required per cardiology - pt cleared for colonoscopy and colon surgery both.   Lelan Pons PV

## 2017-10-05 ENCOUNTER — Encounter: Payer: Self-pay | Admitting: Cardiology

## 2017-10-07 ENCOUNTER — Other Ambulatory Visit: Payer: Self-pay

## 2017-10-07 ENCOUNTER — Encounter: Payer: Self-pay | Admitting: Internal Medicine

## 2017-10-07 ENCOUNTER — Ambulatory Visit (AMBULATORY_SURGERY_CENTER): Payer: No Typology Code available for payment source | Admitting: Internal Medicine

## 2017-10-07 VITALS — BP 131/77 | HR 71 | Temp 98.9°F | Resp 19 | Ht 75.0 in | Wt 232.0 lb

## 2017-10-07 DIAGNOSIS — D123 Benign neoplasm of transverse colon: Secondary | ICD-10-CM

## 2017-10-07 DIAGNOSIS — D126 Benign neoplasm of colon, unspecified: Secondary | ICD-10-CM | POA: Diagnosis not present

## 2017-10-07 DIAGNOSIS — Z86010 Personal history of colonic polyps: Secondary | ICD-10-CM | POA: Diagnosis present

## 2017-10-07 DIAGNOSIS — D125 Benign neoplasm of sigmoid colon: Secondary | ICD-10-CM

## 2017-10-07 DIAGNOSIS — K635 Polyp of colon: Secondary | ICD-10-CM

## 2017-10-07 HISTORY — PX: COLONOSCOPY W/ POLYPECTOMY: SHX1380

## 2017-10-07 MED ORDER — SODIUM CHLORIDE 0.9 % IV SOLN: 500.0000 mL | Freq: Once | INTRAVENOUS | Status: DC

## 2017-10-07 NOTE — Op Note (Signed)
[place] Patient Name: [name] Procedure Date: 10/07/2017 2:50 PM MRN: [id number] Endoscopist: Gatha Mayer , MD Age: 52 Referring MD:  Date of Birth: 1966-03-30 Gender: Male Account #: [id number] Procedure:                Colonoscopy Indications:              Surveillance: Personal history of adenomatous                            polyps on last colonoscopy 3 years ago Medicines:                Propofol per Anesthesia, Monitored Anesthesia Care Procedure:                Pre-Anesthesia Assessment:                           - Prior to the procedure, a History and Physical                            was performed, and patient medications and                            allergies were reviewed. The patient's tolerance of                            previous anesthesia was also reviewed. The risks                            and benefits of the procedure and the sedation                            options and risks were discussed with the patient.                            All questions were answered, and informed consent                            was obtained. Prior Anticoagulants: The patient has                            taken no previous anticoagulant or antiplatelet                            agents. ASA Grade Assessment: III - A patient with                            severe systemic disease. After reviewing the risks                            and benefits, the patient was deemed in                            satisfactory condition to undergo the procedure.  After obtaining informed consent, the colonoscope                            was passed under direct vision. Throughout the                            procedure, the patient's blood pressure, pulse, and                            oxygen saturations were monitored continuously. The                            Colonoscope was introduced through the anus and   advanced to the the cecum, identified by                            appendiceal orifice and ileocecal valve. The                            colonoscopy was somewhat difficult due to                            restricted mobility of the colon. Successful                            completion of the procedure was aided by applying                            abdominal pressure. The patient tolerated the                            procedure well. The quality of the bowel                            preparation was good. The ileocecal valve,                            appendiceal orifice, and rectum were photographed. Scope In: 2:59:57 PM Scope Out: 3:21:48 PM Scope Withdrawal Time: 0 hours 14 minutes 44 seconds  Total Procedure Duration: 0 hours 21 minutes 51 seconds  Findings:                 The perianal and digital rectal examinations were                            normal.                           Two sessile and semi-pedunculated polyps were found                            in the sigmoid colon and transverse colon. The                            polyps were 3 to 7 mm in size.  These polyps were                            removed with a cold snare. Resection and retrieval                            were complete. Verification of patient                            identification for the specimen was done. Estimated                            blood loss was minimal.                           Many diverticula were found in the sigmoid colon.                            There was narrowing of the colon in association                            with the diverticular opening.                           The exam was otherwise without abnormality. Complications:            No immediate complications. Estimated Blood Loss:     Estimated blood loss was minimal. Impression:               - Two 3 to 7 mm polyps in the sigmoid colon and in                            the transverse colon, removed with a  cold snare.                            Resected and retrieved.                           - Severe diverticulosis in the sigmoid colon. There                            was narrowing of the colon in association with the                            diverticular opening.                           - The examination was otherwise normal.                           - Personal history of colonic polyps. 5 adenomas                            max 10 mm 2016 Recommendation:           - Patient has a contact  number available for                            emergencies. The signs and symptoms of potential                            delayed complications were discussed with the                            patient. Return to normal activities tomorrow.                            Written discharge instructions were provided to the                            patient.                           - Resume previous diet.                           - Continue present medications.                           - Repeat colonoscopy is recommended for                            surveillance. The colonoscopy date will be                            determined after pathology results from today's                            exam become available for review.                           Will cc: Dr. Sharyn Lull Gatha Mayer, MD 10/07/2017 3:28:05 PM This report has been signed electronically.

## 2017-10-07 NOTE — Progress Notes (Signed)
Report to PACU, RN, vss, BBS= Clear.  

## 2017-10-07 NOTE — Progress Notes (Signed)
No changes in medical or surgical hx since PV per.  Called to room to assist during endoscopic procedure.  Patient ID and intended procedure confirmed with present staff. Received instructions for my participation in the procedure from the performing physician.

## 2017-10-07 NOTE — Patient Instructions (Addendum)
I found and removed 2 small polyps. They look benign. I will let you know pathology results and when to have another routine colonoscopy by mail and/or My Chart.  You do have diverticulosis - thickened muscle rings and pouches in the colon wall. Please read the handout about this condition.  Good luck with your surgery!  I appreciate the opportunity to care for you. Gatha Mayer, MD, [facility]   Please read handouts provided on diverticulosis and polyps.    YOU HAD AN ENDOSCOPIC PROCEDURE TODAY AT [facility]:   Refer to the procedure report that was given to you for any specific questions about what was found during the examination.  If the procedure report does not answer your questions, please call your gastroenterologist to clarify.  If you requested that your care partner not be given the details of your procedure findings, then the procedure report has been included in a sealed envelope for you to review at your convenience later.  YOU SHOULD EXPECT: Some feelings of bloating in the abdomen. Passage of more gas than usual.  Walking can help get rid of the air that was put into your GI tract during the procedure and reduce the bloating. If you had a lower endoscopy (such as a colonoscopy or flexible sigmoidoscopy) you may notice spotting of blood in your stool or on the toilet paper. If you underwent a bowel prep for your procedure, you may not have a normal bowel movement for a few days.  Please Note:  You might notice some irritation and congestion in your nose or some drainage.  This is from the oxygen used during your procedure.  There is no need for concern and it should clear up in a day or so.  SYMPTOMS TO REPORT IMMEDIATELY:   Following lower endoscopy (colonoscopy or flexible sigmoidoscopy):  Excessive amounts of blood in the stool  Significant tenderness or worsening of abdominal pains  Swelling of the abdomen that is new, acute  Fever of 100F or  higher    For urgent or emergent issues, a gastroenterologist can be reached at any hour by calling [phone].   DIET:  We do recommend a small meal at first, but then you may proceed to your regular diet.  Drink plenty of fluids but you should avoid alcoholic beverages for 24 hours.  ACTIVITY:  You should plan to take it easy for the rest of today and you should NOT DRIVE or use heavy machinery until tomorrow (because of the sedation medicines used during the test).    FOLLOW UP: Our staff will call the number listed on your records the next business day following your procedure to check on you and address any questions or concerns that you may have regarding the information given to you following your procedure. If we do not reach you, we will leave a message.  However, if you are feeling well and you are not experiencing any problems, there is no need to return our call.  We will assume that you have returned to your regular daily activities without incident.  If any biopsies were taken you will be contacted by phone or by letter within the next 1-3 weeks.  Please call us at [phone] if you have not heard about the biopsies in 3 weeks.    SIGNATURES/CONFIDENTIALITY: You and/or your care partner have signed paperwork which will be entered into your electronic medical record.  These signatures attest to the fact that that  the information above on your After Visit Summary has been reviewed and is understood.  Full responsibility of the confidentiality of this discharge information lies with you and/or your care-partner.

## 2017-10-08 ENCOUNTER — Telehealth: Payer: Self-pay | Admitting: *Deleted

## 2017-10-08 NOTE — Telephone Encounter (Signed)
  Follow up Call-  Call back number 10/07/2017  Post procedure Call Back phone  # [phone]  Permission to leave phone message Yes  Some recent data might be hidden     Left message on answering machine to call back if experiencing any problems or had any questions.

## 2017-10-13 ENCOUNTER — Encounter: Payer: Self-pay | Admitting: Internal Medicine

## 2017-10-13 NOTE — Progress Notes (Signed)
1 adenoma Recall 2024 My Chart

## 2017-10-18 ENCOUNTER — Other Ambulatory Visit: Payer: Self-pay | Admitting: Urology

## 2017-10-23 ENCOUNTER — Ambulatory Visit: Payer: Self-pay | Admitting: Surgery

## 2017-10-23 NOTE — H&P (Signed)
CC: Perforated diverticulitis s/p IR drain  HPI: Mr. Cropper is a very pleasant 55yoM with hx of CAD (s/p CABG 2009), HLD here today for evaluation following a recent admission for diverticulitis. He has a history of diverticulitis-uncomplicated in 5009. He is admitted to [facility] 07/13/2017 with right sided lower quadrant abdominal pain, nausea/vomiting. On evaluation, he was found to have perforated diverticulitis with a peri-colonic abscess 3.7 x 2.8cm in size with leukocytosis. He is admitted to hospital and interventional radiology consulted. He underwent percutaneous drain placement 07/12/17 uneventfully. He recovered well from this and was discharged from the hospital. He underwent drain check by IR 07/30/17 which demonstrated no fistula. IR removed his drain. He is here today for follow-up. He denies any complaints today. He denies fever/chills/nausea/vomiting. He denies any changes in his bowel movements. He reports feeling well.  Last colonoscopy-2016 with Dr. Caprice Red sessile polyps 2-10 mm in size in the ascending, descending colon and rectum. These were benign. He was recommended for repeat colonoscopy for surveillance in 3 years which is now.  PMH: OSA; CAD s/p CABG 36yrs ago with harvest of left forearm vessel, follows with Dr. Roderic Palau branch in cardiology.  PSH: CABG 10 years ago; left forearm incision for vessel harvesting  FHx: Paternal uncle with history of colon cancer; his cousin-one of his uncles children also had colon cancer.  Social: Prior cigar smoker - quit >38yrs ago; social EtOH; denies use of drugs.  ROS: A comprehensive 10 system review of systems was completed with the patient and pertinent findings as noted above.  The patient is a 52 year old male.   Past Surgical History Alean Rinne, [place]; 09/10/2017 9:09 AM) Coronary Artery Bypass Graft   Diagnostic Studies History Alean Rinne, [place]; 09/10/2017 9:09 AM) Colonoscopy  1-5 years ago  Allergies  Alean Rinne, [place]; 09/10/2017 9:08 AM) Sulfacetamide *CHEMICALS*  Allergies Reconciled   Medication History Alean Rinne, RMA; 09/10/2017 9:09 AM) Rosuvastatin Calcium (40MG  Tablet, Oral) Active. Ondansetron HCl (8MG  Tablet, Oral) Active. Montelukast Sodium (10MG  Tablet, Oral) Active. MetroNIDAZOLE (500MG  Tablet, Oral) Active. Ciprofloxacin HCl (500MG  Tablet, Oral) Active. Omega 3 (1000MG  Capsule, Oral) Active. Multi-Vitamin (Oral) Active. Medications Reconciled  Social History Alean Rinne, [place]; 09/10/2017 9:09 AM) Alcohol use  Occasional alcohol use. Caffeine use  Carbonated beverages. No drug use  Tobacco use  Former smoker.  Family History Alean Rinne, [place]; 09/10/2017 9:09 AM) Depression  Mother, Sister. Heart Disease  Father, Mother. Heart disease in male family member before age 47  Heart disease in male family member before age 30  Hypertension  Mother. Kidney Disease  Mother.  Other Problems Alean Rinne, [place]; 09/10/2017 9:09 AM) Chest pain  Congestive Heart Failure  Depression  Diverticulosis  Myocardial infarction  Other disease, cancer, significant illness  Sleep Apnea  Vascular Disease     Review of Systems Alean Rinne RMA; 09/10/2017 9:09 AM) General Not Present- Appetite Loss, Chills, Fatigue, Fever, Night Sweats, Weight Gain and Weight Loss. Skin Not Present- Change in Wart/Mole, Dryness, Hives, Jaundice, New Lesions, Non-Healing Wounds, Rash and Ulcer. HEENT Present- Seasonal Allergies. Not Present- Earache, Hearing Loss, Hoarseness, Nose Bleed, Oral Ulcers, Ringing in the Ears, Sinus Pain, Sore Throat, Visual Disturbances, Wears glasses/contact lenses and Yellow Eyes. Respiratory Present- Chronic Cough and Snoring. Not Present- Bloody sputum, Difficulty Breathing and Wheezing. Breast Not Present- Breast Mass, Breast Pain, Nipple Discharge and Skin Changes. Cardiovascular Not Present- Chest Pain, Difficulty Breathing Lying  Down, Leg Cramps, Palpitations, Rapid Heart Rate, Shortness of Breath and Swelling  of Extremities. Gastrointestinal Not Present- Abdominal Pain, Bloating, Bloody Stool, Change in Bowel Habits, Chronic diarrhea, Constipation, Difficulty Swallowing, Excessive gas, Gets full quickly at meals, Hemorrhoids, Indigestion, Nausea, Rectal Pain and Vomiting. Male Genitourinary Present- Impotence. Not Present- Blood in Urine, Change in Urinary Stream, Frequency, Nocturia, Painful Urination, Urgency and Urine Leakage. Musculoskeletal Not Present- Back Pain, Joint Pain, Joint Stiffness, Muscle Pain, Muscle Weakness and Swelling of Extremities. Neurological Not Present- Decreased Memory, Fainting, Headaches, Numbness, Seizures, Tingling, Tremor, Trouble walking and Weakness. Psychiatric Not Present- Anxiety, Bipolar, Change in Sleep Pattern, Depression, Fearful and Frequent crying. Endocrine Not Present- Cold Intolerance, Excessive Hunger, Hair Changes, Heat Intolerance, Hot flashes and New Diabetes. Hematology Present- Blood Thinners. Not Present- Easy Bruising, Excessive bleeding, Gland problems, HIV and Persistent Infections.  Vitals Mardene Celeste King RMA; 09/10/2017 9:07 AM) 09/10/2017 9:07 AM Weight: 225.2 lb Height: 75in Body Surface Area: 2.31 m Body Mass Index: 28.15 kg/m  Temp.: 98.61F  Pulse: 98 (Regular)  BP: 150/85 (Sitting, Left Arm, Standard)       Physical Exam Harrell Gave M. Ayjah Show MD; 09/10/2017 9:54 AM) The physical exam findings are as follows: Note:Constitutional: No acute distress; conversant; no deformities Eyes: Moist conjunctiva; no lid lag; anicteric sclerae; pupils equal round and reactive to light Neck: Trachea midline; no palpable thyromegaly Lungs: Normal respiratory effort; no tactile fremitus CV: Regular rate and rhythm; no palpable thrill; no pitting edema GI: Abdomen soft, nontender, nondistended; no palpable hepatosplenomegaly MSK: Normal gait; no  clubbing/cyanosis; left forearm scar c/w stated surgical history; no palpable left radial pulse, palpable left ulnar pulse Psychiatric: Appropriate affect; alert and oriented 3 Lymphatic: No palpable cervical or axillary lymphadenopathy    Assessment & Plan Harrell Gave M. Chancy Claros MD; 09/10/2017 9:57 AM) DIVERTICULITIS (K57.92) Impression: Mr. Harl Bowie is a very pleasant 60yoM with hx of OSA, CAD (s/p CABG 62yrs ago), HLD recently admitted with complicated diverticulitis with percutaneous drain placement - recovered well and drain removed -Given the above, I recommended laparoscopic versus open sigmoid colectomy, flexible sigmoidoscopy, possible ostomy, cystoscopy/stents by urology -Prior to scheduling for surgery, he will need cardiac clearance with Dr. Carlyle Dolly and he is due for his surveillance colonoscopy with Dr. Carlean Purl  -The anatomy and physiology of the GI tract was discussed at length with the patient and his wife. The pathophysiology of diverticulitis was discussed at length with associated pictures. The options for ongoing management including nonoperative were discussed with its associated risks including recurrent complicated diverticulitis with perforation and need for drain placement (or emergency surgery). -The planned procedure, material risks (including, but not limited to, pain, bleeding, infection, scarring, need for blood transfusion, damage to surrounding structures- blood vessels/nerves/viscus/organs, damage to ureter, urine leak, leak from anastomosis, need for additional procedures, need for stoma which may be permanent, hernia, recurrence, pneumonia, heart attack, stroke, death) benefits and alternatives to surgery were discussed at length. The patient's and his wife's questions were answered to their satisfaction, they voiced understanding and elected to proceed with surgery. Additionally, we discussed typical postoperative expectations and the recovery process.  Signed  electronically by Ileana Roup, MD (09/10/2017 9:57 AM)

## 2017-11-11 NOTE — Addendum Note (Signed)
Addended by: Link Snuffer D III on: 11/11/2017 01:03 PM   Modules accepted: Orders

## 2017-11-20 ENCOUNTER — Encounter (HOSPITAL_COMMUNITY): Payer: Self-pay

## 2017-11-20 NOTE — Pre-Procedure Instructions (Signed)
The following are in epic: Cardiac clearance and Last office note Dr. Harl Bowie 10/02/17 EKG 10/02/17

## 2017-11-20 NOTE — Patient Instructions (Signed)
Your procedure is scheduled on: Wednesday, November 27, 2017   Surgery Time:  11:00AM-2:30PM   Report to [facility] Main  Entrance    Report to admitting at 9:00 AM   Call this number if you have problems the morning of surgery [phone]   Bring CPAP mask and tubing day of surgery   The day before surgery:  Day of prep drink plenty of liquids to prevent dehydration   Miralax:  Mix with 64 oz Gatorade/Powerade. Drink gradually over the next few hours (8 oz glass every 15-30 minutes) until gone the day prior   to surgery.    Neomycin:  At 2 pm, 3 pm and 10 pm after Miralax bowel prep the day prior to surgery.   Metronidazole:  At 2 pm, 3 pm and 10 pm after Miralax bowel prep the day prior to surgery.    Drink 2 Ensure drinks the night before surgery (8:00PM and 10:00PM).      Clear liquids after midnight.   CLEAR LIQUID DIET   Foods Allowed                                                                     Foods Excluded  Water, Coffee and tea, regular and decaf                             liquids that you cannot  Plain Jell-O in any flavor                                             see through such as: Fruit ices (not with fruit pulp)                                     milk, soups, orange juice  Iced Popsicles                                    All solid food Carbonated beverages, regular and diet                                    Cranberry, grape and apple juices Sports drinks like Gatorade Lightly seasoned clear broth or consume(fat free) Sugar, honey syrup  Sample Menu Breakfast                                Lunch                                     Supper Cranberry juice                    Beef broth  Chicken broth Jell-O                                     Grape juice                           Apple juice Coffee or tea                        Jell-O                                      Popsicle                       Coffee or tea                        Coffee or tea   Do NOT smoke after Midnight   Complete one Ensure drink the morning of surgery at 8:00AM.   Nothing in your mouth after 8:00AM morning of surgery (NO GUM, CANDY,MINTS).    Take these medicines the morning of surgery with A SIP OF WATER:                                You may not have any metal on your body including jewelry, and body piercings             Do not wear lotions, powders, perfumes/cologne, or deodorant                          Men may shave face and neck.   Do not bring valuables to the hospital. [place].   Contacts, dentures or bridgework may not be worn into surgery.   Leave suitcase in the car. After surgery it may be brought to your room.   Special Instructions: Bring a copy of your healthcare power of attorney and living will documents         the day of surgery if you haven't scanned them in before.              Please read over the following fact sheets you were given:  [facility] - Preparing for Surgery Before surgery, you can play an important role.  Because skin is not sterile, your skin needs to be as free of germs as possible.  You can reduce the number of germs on your skin by washing with CHG (chlorahexidine gluconate) soap before surgery.  CHG is an antiseptic cleaner which kills germs and bonds with the skin to continue killing germs even after washing. Please DO NOT use if you have an allergy to CHG or antibacterial soaps.  If your skin becomes reddened/irritated stop using the CHG and inform your nurse when you arrive at Short Stay. Do not shave (including legs and underarms) for at least 48 hours prior to the first CHG shower.  You may shave your face/neck.  Please follow these instructions carefully:  1.  Shower with CHG Soap the night before surgery and the  morning of surgery.  2.  If you choose to wash your hair, wash your  hair first as usual with your normal  shampoo.  3.  After you shampoo, rinse your hair and body thoroughly to remove the shampoo.                             4.  Use CHG as you would any other liquid soap.  You can apply chg directly to the skin and wash.  Gently with a scrungie or clean washcloth.  5.  Apply the CHG Soap to your body ONLY FROM THE NECK DOWN.   Do   not use on face/ open                           Wound or open sores. Avoid contact with eyes, ears mouth and   genitals (private parts).                       Wash face,  Genitals (private parts) with your normal soap.             6.  Wash thoroughly, paying special attention to the area where your    surgery  will be performed.  7.  Thoroughly rinse your body with warm water from the neck down.  8.  DO NOT shower/wash with your normal soap after using and rinsing off the CHG Soap.                9.  Pat yourself dry with a clean towel.            10.  Wear clean pajamas.            11.  Place clean sheets on your bed the night of your first shower and do not  sleep with pets. Day of Surgery : Do not apply any lotions/deodorants the morning of surgery.  Please wear clean clothes to the hospital/surgery center.  FAILURE TO FOLLOW THESE INSTRUCTIONS MAY RESULT IN THE CANCELLATION OF YOUR SURGERY  PATIENT SIGNATURE_________________________________  NURSE SIGNATURE__________________________________  ________________________________________________________________________   [name]  An incentive spirometer is a tool that can help keep your lungs clear and active. This tool measures how well you are filling your lungs with each breath. Taking long deep breaths may help reverse or decrease the chance of developing breathing (pulmonary) problems (especially infection) following:  A long period of time when you are unable to move or be active. BEFORE THE PROCEDURE   If the spirometer includes an indicator to show your  best effort, your nurse or respiratory therapist will set it to a desired goal.  If possible, sit up straight or lean slightly forward. Try not to slouch.  Hold the incentive spirometer in an upright position. INSTRUCTIONS FOR USE  1. Sit on the edge of your bed if possible, or sit up as far as you can in bed or on a chair. 2. Hold the incentive spirometer in an upright position. 3. Breathe out normally. 4. Place the mouthpiece in your mouth and seal your lips tightly around it. 5. Breathe in slowly and as deeply as possible, raising the piston or the ball toward the top of the column. 6. Hold your breath for 3-5 seconds or for as long as possible. Allow the piston or ball to fall to the bottom of the column. 7. Remove the mouthpiece from  your mouth and breathe out normally. 8. Rest for a few seconds and repeat Steps 1 through 7 at least 10 times every 1-2 hours when you are awake. Take your time and take a few normal breaths between deep breaths. 9. The spirometer may include an indicator to show your best effort. Use the indicator as a goal to work toward during each repetition. 10. After each set of 10 deep breaths, practice coughing to be sure your lungs are clear. If you have an incision (the cut made at the time of surgery), support your incision when coughing by placing a pillow or rolled up towels firmly against it. Once you are able to get out of bed, walk around indoors and cough well. You may stop using the incentive spirometer when instructed by your caregiver.  RISKS AND COMPLICATIONS  Take your time so you do not get dizzy or light-headed.  If you are in pain, you may need to take or ask for pain medication before doing incentive spirometry. It is harder to take a deep breath if you are having pain. AFTER USE  Rest and breathe slowly and easily.  It can be helpful to keep track of a log of your progress. Your caregiver can provide you with a simple table to help with this. If  you are using the spirometer at home, follow these instructions: [place] IF:   You are having difficultly using the spirometer.  You have trouble using the spirometer as often as instructed.  Your pain medication is not giving enough relief while using the spirometer.  You develop fever of 100.5 F (38.1 C) or higher. SEEK IMMEDIATE MEDICAL CARE IF:   You cough up bloody sputum that had not been present before.  You develop fever of 102 F (38.9 C) or greater.  You develop worsening pain at or near the incision site. MAKE SURE YOU:   Understand these instructions.  Will watch your condition.  Will get help right away if you are not doing well or get worse. Document Released: 09/03/2006 Document Revised: 07/16/2011 Document Reviewed: 11/04/2006 ExitCare Patient Information 2014 ExitCare, [place].   ________________________________________________________________________  WHAT IS A BLOOD TRANSFUSION? Blood Transfusion Information  A transfusion is the replacement of blood or some of its parts. Blood is made up of multiple cells which provide different functions.  Red blood cells carry oxygen and are used for blood loss replacement.  White blood cells fight against infection.  Platelets control bleeding.  Plasma helps clot blood.  Other blood products are available for specialized needs, such as hemophilia or other clotting disorders. BEFORE THE TRANSFUSION  Who gives blood for transfusions?   Healthy volunteers who are fully evaluated to make sure their blood is safe. This is blood bank blood. Transfusion therapy is the safest it has ever been in the practice of medicine. Before blood is taken from a donor, a complete history is taken to make sure that person has no history of diseases nor engages in risky social behavior (examples are intravenous drug use or sexual activity with multiple partners). The donor's travel history is screened to minimize risk of  transmitting infections, such as malaria. The donated blood is tested for signs of infectious diseases, such as HIV and hepatitis. The blood is then tested to be sure it is compatible with you in order to minimize the chance of a transfusion reaction. If you or a relative donates blood, this is often done in anticipation of surgery and is  not appropriate for emergency situations. It takes many days to process the donated blood. RISKS AND COMPLICATIONS Although transfusion therapy is very safe and saves many lives, the main dangers of transfusion include:   Getting an infectious disease.  Developing a transfusion reaction. This is an allergic reaction to something in the blood you were given. Every precaution is taken to prevent this. The decision to have a blood transfusion has been considered carefully by your caregiver before blood is given. Blood is not given unless the benefits outweigh the risks. AFTER THE TRANSFUSION  Right after receiving a blood transfusion, you will usually feel much better and more energetic. This is especially true if your red blood cells have gotten low (anemic). The transfusion raises the level of the red blood cells which carry oxygen, and this usually causes an energy increase.  The nurse administering the transfusion will monitor you carefully for complications. HOME CARE INSTRUCTIONS  No special instructions are needed after a transfusion. You may find your energy is better. Speak with your caregiver about any limitations on activity for underlying diseases you may have. SEEK MEDICAL CARE IF:   Your condition is not improving after your transfusion.  You develop redness or irritation at the intravenous (IV) site. SEEK IMMEDIATE MEDICAL CARE IF:  Any of the following symptoms occur over the next 12 hours:  Shaking chills.  You have a temperature by mouth above 102 F (38.9 C), not controlled by medicine.  Chest, back, or muscle pain.  People around you  feel you are not acting correctly or are confused.  Shortness of breath or difficulty breathing.  Dizziness and fainting.  You get a rash or develop hives.  You have a decrease in urine output.  Your urine turns a dark color or changes to pink, red, or brown. Any of the following symptoms occur over the next 10 days:  You have a temperature by mouth above 102 F (38.9 C), not controlled by medicine.  Shortness of breath.  Weakness after normal activity.  The white part of the eye turns yellow (jaundice).  You have a decrease in the amount of urine or are urinating less often.  Your urine turns a dark color or changes to pink, red, or brown. Document Released: 04/20/2000 Document Revised: 07/16/2011 Document Reviewed: 12/08/2007 [facility] Patient Information 2014 [place], [place].  _______________________________________________________________________

## 2017-11-21 ENCOUNTER — Other Ambulatory Visit: Payer: Self-pay

## 2017-11-21 ENCOUNTER — Encounter (HOSPITAL_COMMUNITY): Payer: Self-pay

## 2017-11-21 ENCOUNTER — Encounter (HOSPITAL_COMMUNITY)
Admission: RE | Admit: 2017-11-21 | Discharge: 2017-11-21 | Disposition: A | Discharge status: Home / Self Care | Payer: No Typology Code available for payment source | Source: Ambulatory Visit | Attending: Surgery | Admitting: Surgery

## 2017-11-21 DIAGNOSIS — Z01818 Encounter for other preprocedural examination: Secondary | ICD-10-CM | POA: Insufficient documentation

## 2017-11-21 DIAGNOSIS — K5792 Diverticulitis of intestine, part unspecified, without perforation or abscess without bleeding: Secondary | ICD-10-CM | POA: Insufficient documentation

## 2017-11-21 HISTORY — DX: Anemia, unspecified: D64.9

## 2017-11-21 HISTORY — DX: Anesthesia of skin: R20.0

## 2017-11-21 HISTORY — DX: Personal history of urinary (tract) infections: Z87.440

## 2017-11-21 HISTORY — DX: Diverticulosis of intestine, part unspecified, without perforation or abscess without bleeding: K57.90

## 2017-11-21 HISTORY — DX: Personal history of other diseases of the digestive system: Z87.19

## 2017-11-21 HISTORY — DX: Anesthesia of skin: R20.2

## 2017-11-21 LAB — CBC WITH DIFFERENTIAL/PLATELET
Basophils Absolute: 0.1 10*3/uL (ref 0.0–0.1)
Basophils Relative: 1 %
EOS PCT: 4 %
Eosinophils Absolute: 0.3 10*3/uL (ref 0.0–0.7)
HEMATOCRIT: 42.8 % (ref 39.0–52.0)
HEMOGLOBIN: 14.7 g/dL (ref 13.0–17.0)
LYMPHS ABS: 2.9 10*3/uL (ref 0.7–4.0)
LYMPHS PCT: 31 %
MCH: 29.2 pg (ref 26.0–34.0)
MCHC: 34.3 g/dL (ref 30.0–36.0)
MCV: 85.1 fL (ref 78.0–100.0)
Monocytes Absolute: 0.6 10*3/uL (ref 0.1–1.0)
Monocytes Relative: 7 %
Neutro Abs: 5.5 10*3/uL (ref 1.7–7.7)
Neutrophils Relative %: 57 %
Platelets: 302 10*3/uL (ref 150–400)
RBC: 5.03 MIL/uL (ref 4.22–5.81)
RDW: 13 % (ref 11.5–15.5)
WBC: 9.4 10*3/uL (ref 4.0–10.5)

## 2017-11-21 LAB — COMPREHENSIVE METABOLIC PANEL
ALK PHOS: 61 U/L (ref 38–126)
ALT: 36 U/L (ref 0–44)
AST: 32 U/L (ref 15–41)
Albumin: 4.1 g/dL (ref 3.5–5.0)
Anion gap: 8 (ref 5–15)
BILIRUBIN TOTAL: 0.5 mg/dL (ref 0.3–1.2)
BUN: 12 mg/dL (ref 6–20)
CALCIUM: 9.5 mg/dL (ref 8.9–10.3)
CO2: 25 mmol/L (ref 22–32)
CREATININE: 0.98 mg/dL (ref 0.61–1.24)
Chloride: 109 mmol/L (ref 98–111)
Glucose, Bld: 103 mg/dL — ABNORMAL HIGH (ref 70–99)
Potassium: 3.7 mmol/L (ref 3.5–5.1)
Sodium: 142 mmol/L (ref 135–145)
Total Protein: 7.4 g/dL (ref 6.5–8.1)

## 2017-11-21 LAB — HEMOGLOBIN A1C
HEMOGLOBIN A1C: 5.8 % — AB (ref 4.8–5.6)
MEAN PLASMA GLUCOSE: 119.76 mg/dL

## 2017-11-21 MED ORDER — POLYETHYLENE GLYCOL 3350 17 GM/SCOOP PO POWD
1.0000 | Freq: Once | ORAL | Status: DC
  Filled 2017-11-21: qty 255

## 2017-11-21 MED ORDER — NEOMYCIN SULFATE 500 MG PO TABS
1000.0000 mg | ORAL_TABLET | ORAL | Status: DC
  Filled 2017-11-21: qty 2

## 2017-11-21 MED ORDER — METRONIDAZOLE 500 MG PO TABS
1000.0000 mg | ORAL_TABLET | ORAL | Status: DC
  Filled 2017-11-21: qty 2

## 2017-11-21 NOTE — Pre-Procedure Instructions (Signed)
CMP results 11/21/2017 faxed to Dr. Dema Severin via epic

## 2017-11-21 NOTE — Consult Note (Signed)
[place] Nurse requested for preoperative stoma site marking  Discussed surgical procedure and stoma creation with patient and family.  Explained role of the [place] nurse team.  Provided the patient with educational booklet and provided samples of pouching options.  Answered patient and family questions.   Examined patient sitting, and standing in order to place the marking in the patient's visual field, away from any creases or abdominal contour issues and within the rectus muscle.  Attempted to mark above the patient's belt line.   Marked for colostomy in the LUQ  _7.5___ cm to the left of the umbilicus and _0___RV above the umbilicus.  Marked for colostomy in the RUQ  _7.5___ cm to the right of the umbilicus and _6___FB above the umbilicus.  Patient's abdomen cleansed with CHG wipes at site markings, allowed to air dry prior to marking. Covered mark with thin film transparent dressing to preserve mark until date of surgery.   [place] Nurse team will follow up with patient after surgery for continue ostomy care and teaching.   Val Riles, RN, MSN, CWOCN, CNS-BC, pager [phone]

## 2017-11-22 LAB — ABO/RH: ABO/RH(D): A POS

## 2017-11-22 NOTE — Pre-Procedure Instructions (Signed)
Hgb A 1C results7/18/2019 faxed to Dr. Dema Severin via epic

## 2017-11-26 MED ORDER — BUPIVACAINE LIPOSOME 1.3 % IJ SUSP
20.0000 mL | Freq: Once | INTRAMUSCULAR | Status: DC
  Filled 2017-11-26: qty 20

## 2017-11-27 ENCOUNTER — Inpatient Hospital Stay (HOSPITAL_COMMUNITY): Payer: No Typology Code available for payment source

## 2017-11-27 ENCOUNTER — Other Ambulatory Visit: Payer: Self-pay

## 2017-11-27 ENCOUNTER — Encounter (HOSPITAL_COMMUNITY): Payer: Self-pay | Admitting: Emergency Medicine

## 2017-11-27 ENCOUNTER — Inpatient Hospital Stay (HOSPITAL_COMMUNITY): Payer: No Typology Code available for payment source | Admitting: Registered Nurse

## 2017-11-27 ENCOUNTER — Encounter (HOSPITAL_COMMUNITY)
Admission: RE | Disposition: A | Discharge status: Home / Self Care | Payer: Self-pay | Source: Ambulatory Visit | Attending: Surgery

## 2017-11-27 ENCOUNTER — Inpatient Hospital Stay (HOSPITAL_COMMUNITY)
Admission: RE | Admit: 2017-11-27 | Discharge: 2017-11-30 | DRG: 331 | Disposition: A | Discharge status: Home / Self Care | Payer: No Typology Code available for payment source | Source: Ambulatory Visit | Attending: Surgery | Admitting: Surgery

## 2017-11-27 DIAGNOSIS — Z882 Allergy status to sulfonamides status: Secondary | ICD-10-CM | POA: Diagnosis not present

## 2017-11-27 DIAGNOSIS — E669 Obesity, unspecified: Secondary | ICD-10-CM | POA: Diagnosis present

## 2017-11-27 DIAGNOSIS — Z87891 Personal history of nicotine dependence: Secondary | ICD-10-CM | POA: Diagnosis not present

## 2017-11-27 DIAGNOSIS — E785 Hyperlipidemia, unspecified: Secondary | ICD-10-CM | POA: Diagnosis present

## 2017-11-27 DIAGNOSIS — K5720 Diverticulitis of large intestine with perforation and abscess without bleeding: Principal | ICD-10-CM | POA: Diagnosis present

## 2017-11-27 DIAGNOSIS — Z9989 Dependence on other enabling machines and devices: Secondary | ICD-10-CM

## 2017-11-27 DIAGNOSIS — I252 Old myocardial infarction: Secondary | ICD-10-CM

## 2017-11-27 DIAGNOSIS — Z951 Presence of aortocoronary bypass graft: Secondary | ICD-10-CM

## 2017-11-27 DIAGNOSIS — Z79899 Other long term (current) drug therapy: Secondary | ICD-10-CM

## 2017-11-27 DIAGNOSIS — Z6829 Body mass index (BMI) 29.0-29.9, adult: Secondary | ICD-10-CM

## 2017-11-27 DIAGNOSIS — Z9049 Acquired absence of other specified parts of digestive tract: Secondary | ICD-10-CM

## 2017-11-27 DIAGNOSIS — Z86010 Personal history of colonic polyps: Secondary | ICD-10-CM

## 2017-11-27 DIAGNOSIS — G4733 Obstructive sleep apnea (adult) (pediatric): Secondary | ICD-10-CM | POA: Diagnosis present

## 2017-11-27 DIAGNOSIS — Z885 Allergy status to narcotic agent status: Secondary | ICD-10-CM | POA: Diagnosis not present

## 2017-11-27 DIAGNOSIS — I2510 Atherosclerotic heart disease of native coronary artery without angina pectoris: Secondary | ICD-10-CM | POA: Diagnosis present

## 2017-11-27 DIAGNOSIS — Z7982 Long term (current) use of aspirin: Secondary | ICD-10-CM

## 2017-11-27 HISTORY — PX: FLEXIBLE SIGMOIDOSCOPY: SHX5431

## 2017-11-27 HISTORY — PX: CYSTOSCOPY W/ RETROGRADES: SHX1426

## 2017-11-27 HISTORY — PX: LAPAROSCOPIC SIGMOID COLECTOMY: SHX5928

## 2017-11-27 LAB — TYPE AND SCREEN
ABO/RH(D): A POS
Antibody Screen: NEGATIVE

## 2017-11-27 SURGERY — COLECTOMY, SIGMOID, LAPAROSCOPIC
Anesthesia: General

## 2017-11-27 MED ORDER — FENTANYL CITRATE (PF) 100 MCG/2ML IJ SOLN
25.0000 ug | INTRAMUSCULAR | Status: DC | PRN
  Administered 2017-11-27 (×3): 50 ug via INTRAVENOUS

## 2017-11-27 MED ORDER — SACCHAROMYCES BOULARDII 250 MG PO CAPS
250.0000 mg | ORAL_CAPSULE | Freq: Two times a day (BID) | ORAL | Status: DC
  Administered 2017-11-27 – 2017-11-29 (×5): 250 mg via ORAL
  Filled 2017-11-27 (×6): qty 1

## 2017-11-27 MED ORDER — IBUPROFEN 200 MG PO TABS
600.0000 mg | ORAL_TABLET | Freq: Four times a day (QID) | ORAL | Status: DC
  Administered 2017-11-27 – 2017-11-30 (×9): 600 mg via ORAL
  Filled 2017-11-27 (×9): qty 3

## 2017-11-27 MED ORDER — PROMETHAZINE HCL 25 MG/ML IJ SOLN: 6.2500 mg | INTRAMUSCULAR | Status: DC | PRN

## 2017-11-27 MED ORDER — ONDANSETRON HCL 4 MG PO TABS: 4.0000 mg | ORAL_TABLET | Freq: Four times a day (QID) | ORAL | Status: DC | PRN

## 2017-11-27 MED ORDER — ALVIMOPAN 12 MG PO CAPS
12.0000 mg | ORAL_CAPSULE | ORAL | Status: AC
  Administered 2017-11-27: 12 mg via ORAL
  Filled 2017-11-27: qty 1

## 2017-11-27 MED ORDER — LIDOCAINE 2% (20 MG/ML) 5 ML SYRINGE
INTRAMUSCULAR | Status: AC
  Filled 2017-11-27: qty 5

## 2017-11-27 MED ORDER — ROCURONIUM BROMIDE 10 MG/ML (PF) SYRINGE
PREFILLED_SYRINGE | INTRAVENOUS | Status: AC
  Filled 2017-11-27: qty 10

## 2017-11-27 MED ORDER — FENTANYL CITRATE (PF) 250 MCG/5ML IJ SOLN
INTRAMUSCULAR | Status: AC
  Filled 2017-11-27: qty 5

## 2017-11-27 MED ORDER — GABAPENTIN 300 MG PO CAPS
300.0000 mg | ORAL_CAPSULE | ORAL | Status: AC
  Administered 2017-11-27: 300 mg via ORAL
  Filled 2017-11-27: qty 1

## 2017-11-27 MED ORDER — FENTANYL CITRATE (PF) 100 MCG/2ML IJ SOLN
INTRAMUSCULAR | Status: AC
  Filled 2017-11-27: qty 4

## 2017-11-27 MED ORDER — LACTATED RINGERS IV SOLN
INTRAVENOUS | Status: DC
  Administered 2017-11-27 (×2): via INTRAVENOUS

## 2017-11-27 MED ORDER — TRAMADOL HCL 50 MG PO TABS
50.0000 mg | ORAL_TABLET | Freq: Four times a day (QID) | ORAL | Status: DC | PRN
  Administered 2017-11-28 – 2017-11-29 (×5): 50 mg via ORAL
  Filled 2017-11-27 (×6): qty 1

## 2017-11-27 MED ORDER — FENTANYL CITRATE (PF) 100 MCG/2ML IJ SOLN
INTRAMUSCULAR | Status: DC | PRN
  Administered 2017-11-27 (×5): 50 ug via INTRAVENOUS

## 2017-11-27 MED ORDER — ENSURE SURGERY PO LIQD
237.0000 mL | Freq: Two times a day (BID) | ORAL | Status: DC
  Administered 2017-11-27 – 2017-11-29 (×5): 237 mL via ORAL
  Filled 2017-11-27 (×6): qty 237

## 2017-11-27 MED ORDER — ALUM & MAG HYDROXIDE-SIMETH 200-200-20 MG/5ML PO SUSP
30.0000 mL | Freq: Four times a day (QID) | ORAL | Status: DC | PRN
  Administered 2017-11-29 (×2): 30 mL via ORAL
  Filled 2017-11-27 (×2): qty 30

## 2017-11-27 MED ORDER — MIDAZOLAM HCL 5 MG/5ML IJ SOLN
INTRAMUSCULAR | Status: DC | PRN
  Administered 2017-11-27: 2 mg via INTRAVENOUS

## 2017-11-27 MED ORDER — SODIUM CHLORIDE 0.9 % IR SOLN
Status: DC | PRN
  Administered 2017-11-27: 1000 mL

## 2017-11-27 MED ORDER — ACETAMINOPHEN 500 MG PO TABS
1000.0000 mg | ORAL_TABLET | Freq: Four times a day (QID) | ORAL | Status: DC
  Administered 2017-11-27 – 2017-11-30 (×10): 1000 mg via ORAL
  Filled 2017-11-27 (×10): qty 2

## 2017-11-27 MED ORDER — LIDOCAINE 2% (20 MG/ML) 5 ML SYRINGE
INTRAMUSCULAR | Status: AC
  Filled 2017-11-27: qty 10

## 2017-11-27 MED ORDER — ALVIMOPAN 12 MG PO CAPS
12.0000 mg | ORAL_CAPSULE | Freq: Two times a day (BID) | ORAL | Status: DC
  Administered 2017-11-28 (×2): 12 mg via ORAL
  Filled 2017-11-27 (×2): qty 1

## 2017-11-27 MED ORDER — IPRATROPIUM BROMIDE 0.06 % NA SOLN
2.0000 | Freq: Four times a day (QID) | NASAL | Status: DC | PRN
  Filled 2017-11-27: qty 15

## 2017-11-27 MED ORDER — ONDANSETRON HCL 4 MG/2ML IJ SOLN
INTRAMUSCULAR | Status: DC | PRN
  Administered 2017-11-27: 4 mg via INTRAVENOUS

## 2017-11-27 MED ORDER — LORATADINE 10 MG PO TABS
10.0000 mg | ORAL_TABLET | Freq: Every evening | ORAL | Status: DC
  Administered 2017-11-27 – 2017-11-29 (×3): 10 mg via ORAL
  Filled 2017-11-27 (×3): qty 1

## 2017-11-27 MED ORDER — LACTATED RINGERS IV SOLN
INTRAVENOUS | Status: DC
  Administered 2017-11-27: 16:00:00 via INTRAVENOUS
  Administered 2017-11-27: 1000 mL via INTRAVENOUS
  Administered 2017-11-28: 14:00:00 via INTRAVENOUS
  Administered 2017-11-29: 75 mL/h via INTRAVENOUS
  Administered 2017-11-29: 17:00:00 via INTRAVENOUS

## 2017-11-27 MED ORDER — SUGAMMADEX SODIUM 500 MG/5ML IV SOLN
INTRAVENOUS | Status: AC
  Filled 2017-11-27: qty 5

## 2017-11-27 MED ORDER — CHLORHEXIDINE GLUCONATE CLOTH 2 % EX PADS: 6.0000 | MEDICATED_PAD | Freq: Once | CUTANEOUS | Status: DC

## 2017-11-27 MED ORDER — LACTATED RINGERS IR SOLN
Status: DC | PRN
  Administered 2017-11-27: 1000 mL

## 2017-11-27 MED ORDER — DEXAMETHASONE SODIUM PHOSPHATE 10 MG/ML IJ SOLN
INTRAMUSCULAR | Status: AC
  Filled 2017-11-27: qty 1

## 2017-11-27 MED ORDER — ASPIRIN 81 MG PO CHEW
81.0000 mg | CHEWABLE_TABLET | Freq: Every evening | ORAL | Status: DC
  Administered 2017-11-27 – 2017-11-29 (×3): 81 mg via ORAL
  Filled 2017-11-27 (×3): qty 1

## 2017-11-27 MED ORDER — DEXAMETHASONE SODIUM PHOSPHATE 10 MG/ML IJ SOLN
INTRAMUSCULAR | Status: DC | PRN
  Administered 2017-11-27: 10 mg via INTRAVENOUS

## 2017-11-27 MED ORDER — DIPHENHYDRAMINE HCL 12.5 MG/5ML PO ELIX
12.5000 mg | ORAL_SOLUTION | Freq: Four times a day (QID) | ORAL | Status: DC | PRN
  Filled 2017-11-27: qty 5

## 2017-11-27 MED ORDER — LIDOCAINE HCL URETHRAL/MUCOSAL 2 % EX GEL
CUTANEOUS | Status: AC
  Filled 2017-11-27: qty 5

## 2017-11-27 MED ORDER — DIPHENHYDRAMINE HCL 50 MG/ML IJ SOLN: 12.5000 mg | Freq: Four times a day (QID) | INTRAMUSCULAR | Status: DC | PRN

## 2017-11-27 MED ORDER — PROPOFOL 10 MG/ML IV BOLUS
INTRAVENOUS | Status: AC
  Filled 2017-11-27: qty 20

## 2017-11-27 MED ORDER — SODIUM CHLORIDE 0.9 % IV SOLN
INTRAVENOUS | Status: AC
  Filled 2017-11-27: qty 2

## 2017-11-27 MED ORDER — ROCURONIUM BROMIDE 10 MG/ML (PF) SYRINGE
PREFILLED_SYRINGE | INTRAVENOUS | Status: DC | PRN
  Administered 2017-11-27: 20 mg via INTRAVENOUS
  Administered 2017-11-27 (×3): 10 mg via INTRAVENOUS
  Administered 2017-11-27: 20 mg via INTRAVENOUS
  Administered 2017-11-27: 50 mg via INTRAVENOUS

## 2017-11-27 MED ORDER — DIPHENHYDRAMINE HCL 50 MG/ML IJ SOLN
INTRAMUSCULAR | Status: DC | PRN
  Administered 2017-11-27: 12.5 mg via INTRAVENOUS

## 2017-11-27 MED ORDER — BUPIVACAINE-EPINEPHRINE (PF) 0.25% -1:200000 IJ SOLN
INTRAMUSCULAR | Status: AC
  Filled 2017-11-27: qty 30

## 2017-11-27 MED ORDER — ONDANSETRON HCL 4 MG/2ML IJ SOLN: 4.0000 mg | Freq: Four times a day (QID) | INTRAMUSCULAR | Status: DC | PRN

## 2017-11-27 MED ORDER — ONDANSETRON HCL 4 MG/2ML IJ SOLN
INTRAMUSCULAR | Status: AC
  Filled 2017-11-27: qty 2

## 2017-11-27 MED ORDER — ACETAMINOPHEN 500 MG PO TABS
1000.0000 mg | ORAL_TABLET | ORAL | Status: AC
  Administered 2017-11-27: 1000 mg via ORAL
  Filled 2017-11-27: qty 2

## 2017-11-27 MED ORDER — ROSUVASTATIN CALCIUM 20 MG PO TABS
40.0000 mg | ORAL_TABLET | Freq: Every evening | ORAL | Status: DC
  Administered 2017-11-27 – 2017-11-29 (×3): 40 mg via ORAL
  Filled 2017-11-27 (×3): qty 2

## 2017-11-27 MED ORDER — PROPOFOL 10 MG/ML IV BOLUS
INTRAVENOUS | Status: DC | PRN
  Administered 2017-11-27: 150 mg via INTRAVENOUS

## 2017-11-27 MED ORDER — LIDOCAINE 2% (20 MG/ML) 5 ML SYRINGE
INTRAMUSCULAR | Status: DC | PRN
  Administered 2017-11-27: 1 mg/kg/h via INTRAVENOUS

## 2017-11-27 MED ORDER — 0.9 % SODIUM CHLORIDE (POUR BTL) OPTIME
TOPICAL | Status: DC | PRN
  Administered 2017-11-27: 2000 mL

## 2017-11-27 MED ORDER — MIDAZOLAM HCL 2 MG/2ML IJ SOLN
INTRAMUSCULAR | Status: AC
  Filled 2017-11-27: qty 2

## 2017-11-27 MED ORDER — SUGAMMADEX SODIUM 500 MG/5ML IV SOLN
INTRAVENOUS | Status: DC | PRN
  Administered 2017-11-27: 220 mg via INTRAVENOUS

## 2017-11-27 MED ORDER — HEPARIN SODIUM (PORCINE) 5000 UNIT/ML IJ SOLN
5000.0000 [IU] | Freq: Three times a day (TID) | INTRAMUSCULAR | Status: DC
  Administered 2017-11-27 – 2017-11-30 (×8): 5000 [IU] via SUBCUTANEOUS
  Filled 2017-11-27 (×8): qty 1

## 2017-11-27 MED ORDER — BUPIVACAINE LIPOSOME 1.3 % IJ SUSP
INTRAMUSCULAR | Status: DC | PRN
  Administered 2017-11-27: 20 mL

## 2017-11-27 MED ORDER — DIPHENHYDRAMINE HCL 50 MG/ML IJ SOLN
INTRAMUSCULAR | Status: AC
  Filled 2017-11-27: qty 1

## 2017-11-27 MED ORDER — BUPIVACAINE-EPINEPHRINE (PF) 0.25% -1:200000 IJ SOLN
INTRAMUSCULAR | Status: DC | PRN
  Administered 2017-11-27: 30 mL

## 2017-11-27 MED ORDER — LIDOCAINE 2% (20 MG/ML) 5 ML SYRINGE
INTRAMUSCULAR | Status: DC | PRN
  Administered 2017-11-27: 100 mg via INTRAVENOUS

## 2017-11-27 MED ORDER — HYDRALAZINE HCL 20 MG/ML IJ SOLN: 10.0000 mg | INTRAMUSCULAR | Status: DC | PRN

## 2017-11-27 MED ORDER — KETAMINE HCL 10 MG/ML IJ SOLN
INTRAMUSCULAR | Status: DC | PRN
  Administered 2017-11-27: 40 mg via INTRAVENOUS

## 2017-11-27 MED ORDER — MONTELUKAST SODIUM 10 MG PO TABS
10.0000 mg | ORAL_TABLET | Freq: Every day | ORAL | Status: DC
  Administered 2017-11-27 – 2017-11-29 (×3): 10 mg via ORAL
  Filled 2017-11-27 (×3): qty 1

## 2017-11-27 MED ORDER — IOHEXOL 300 MG/ML  SOLN
INTRAMUSCULAR | Status: DC | PRN
  Administered 2017-11-27: 20 mL

## 2017-11-27 MED ORDER — INDIGOTINDISULFONATE SODIUM 8 MG/ML IJ SOLN
INTRAMUSCULAR | Status: AC
  Filled 2017-11-27: qty 5

## 2017-11-27 MED ORDER — FENTANYL CITRATE (PF) 100 MCG/2ML IJ SOLN
INTRAMUSCULAR | Status: AC
  Filled 2017-11-27: qty 2

## 2017-11-27 MED ORDER — FENTANYL CITRATE (PF) 100 MCG/2ML IJ SOLN
25.0000 ug | INTRAMUSCULAR | Status: DC | PRN
  Administered 2017-11-27 (×2): 50 ug via INTRAVENOUS

## 2017-11-27 MED ORDER — HEPARIN SODIUM (PORCINE) 5000 UNIT/ML IJ SOLN
5000.0000 [IU] | Freq: Once | INTRAMUSCULAR | Status: AC
  Administered 2017-11-27: 5000 [IU] via SUBCUTANEOUS
  Filled 2017-11-27: qty 1

## 2017-11-27 MED ORDER — CEFOTETAN DISODIUM 2 G IJ SOLR
2.0000 g | INTRAMUSCULAR | Status: AC
  Administered 2017-11-27: 2 g via INTRAVENOUS
  Filled 2017-11-27: qty 2

## 2017-11-27 MED ORDER — HYDROMORPHONE HCL 1 MG/ML IJ SOLN
0.5000 mg | INTRAMUSCULAR | Status: DC | PRN
  Administered 2017-11-28 – 2017-11-29 (×3): 0.5 mg via INTRAVENOUS
  Filled 2017-11-27 (×4): qty 0.5

## 2017-11-27 SURGICAL SUPPLY — 88 items
ADAPTER GOLDBERG URETERAL (ADAPTER) ×4 IMPLANT
APPLIER CLIP 5 13 M/L LIGAMAX5 (MISCELLANEOUS)
APPLIER CLIP ROT 10 11.4 M/L (STAPLE)
BAG URO CATCHER STRL LF (MISCELLANEOUS) ×4 IMPLANT
BLADE EXTENDED COATED 6.5IN (ELECTRODE) IMPLANT
CABLE HIGH FREQUENCY MONO STRZ (ELECTRODE) ×4 IMPLANT
CATH INTERMIT  6FR 70CM (CATHETERS) ×8 IMPLANT
CATH MUSHROOM 28FR (CATHETERS) IMPLANT
CATH MUSHROOM 30FR (CATHETERS) IMPLANT
CELLS DAT CNTRL 66122 CELL SVR (MISCELLANEOUS) IMPLANT
CLIP APPLIE 5 13 M/L LIGAMAX5 (MISCELLANEOUS) IMPLANT
CLIP APPLIE ROT 10 11.4 M/L (STAPLE) IMPLANT
CLOTH BEACON ORANGE TIMEOUT ST (SAFETY) ×4 IMPLANT
COVER FOOTSWITCH UNIV (MISCELLANEOUS) IMPLANT
COVER SURGICAL LIGHT HANDLE (MISCELLANEOUS) ×4 IMPLANT
DECANTER SPIKE VIAL GLASS SM (MISCELLANEOUS) ×4 IMPLANT
DERMABOND ADVANCED (GAUZE/BANDAGES/DRESSINGS) ×2
DERMABOND ADVANCED .7 DNX12 (GAUZE/BANDAGES/DRESSINGS) ×2 IMPLANT
DISSECTOR BLUNT TIP ENDO 5MM (MISCELLANEOUS) IMPLANT
DRAIN CHANNEL 19F RND (DRAIN) IMPLANT
DRAPE SURG IRRIG POUCH 19X23 (DRAPES) ×4 IMPLANT
DRSG OPSITE POSTOP 4X10 (GAUZE/BANDAGES/DRESSINGS) IMPLANT
DRSG OPSITE POSTOP 4X6 (GAUZE/BANDAGES/DRESSINGS) ×4 IMPLANT
DRSG OPSITE POSTOP 4X8 (GAUZE/BANDAGES/DRESSINGS) IMPLANT
ELECT REM PT RETURN 15FT ADLT (MISCELLANEOUS) ×4 IMPLANT
EVACUATOR SILICONE 100CC (DRAIN) IMPLANT
GAUZE SPONGE 4X4 12PLY STRL (GAUZE/BANDAGES/DRESSINGS) IMPLANT
GLOVE BIO SURGEON STRL SZ7.5 (GLOVE) ×12 IMPLANT
GLOVE INDICATOR 8.0 STRL GRN (GLOVE) ×8 IMPLANT
GOWN STRL REUS W/TWL LRG LVL3 (GOWN DISPOSABLE) ×8 IMPLANT
GOWN STRL REUS W/TWL XL LVL3 (GOWN DISPOSABLE) ×16 IMPLANT
GUIDEWIRE STR DUAL SENSOR (WIRE) ×4 IMPLANT
HOLDER FOLEY CATH W/STRAP (MISCELLANEOUS) ×4 IMPLANT
KIT DEFENDO BUTTON (KITS) ×4 IMPLANT
KIT SIGMOIDOSCOPE (SET/KITS/TRAYS/PACK) ×4 IMPLANT
LIGASURE IMPACT 36 18CM CVD LR (INSTRUMENTS) IMPLANT
MANIFOLD NEPTUNE II (INSTRUMENTS) ×4 IMPLANT
NEEDLE INSUFFLATION 14GA 120MM (NEEDLE) IMPLANT
PACK COLON (CUSTOM PROCEDURE TRAY) ×4 IMPLANT
PACK CYSTO (CUSTOM PROCEDURE TRAY) ×4 IMPLANT
PAD POSITIONING PINK XL (MISCELLANEOUS) ×4 IMPLANT
PORT LAP GEL ALEXIS MED 5-9CM (MISCELLANEOUS) IMPLANT
RELOAD STAPLER GREEN 60MM (STAPLE) ×2 IMPLANT
RTRCTR WOUND ALEXIS 18CM MED (MISCELLANEOUS)
SCISSORS LAP 5X35 DISP (ENDOMECHANICALS) ×4 IMPLANT
SEALER TISSUE G2 STRG ARTC 35C (ENDOMECHANICALS) ×4 IMPLANT
SET IRRIG TUBING LAPAROSCOPIC (IRRIGATION / IRRIGATOR) ×8 IMPLANT
SLEEVE ADV FIXATION 5X100MM (TROCAR) ×8 IMPLANT
SPONGE DRAIN TRACH 4X4 STRL 2S (GAUZE/BANDAGES/DRESSINGS) IMPLANT
SPONGE LAP 18X18 X RAY DECT (DISPOSABLE) ×4 IMPLANT
STAPLER CIRCULAR 29MM (STAPLE) ×4 IMPLANT
STAPLER ECHELON LONG 60 440 (INSTRUMENTS) ×4 IMPLANT
STAPLER RELOAD GREEN 60MM (STAPLE) ×4
STAPLER VISISTAT 35W (STAPLE) IMPLANT
SUT ETHILON 3 0 PS 1 (SUTURE) IMPLANT
SUT MNCRL AB 4-0 PS2 18 (SUTURE) ×4 IMPLANT
SUT PDS AB 1 CT1 27 (SUTURE) ×8 IMPLANT
SUT PDS AB 1 CTX 36 (SUTURE) IMPLANT
SUT PDS AB 1 TP1 54 (SUTURE) IMPLANT
SUT PDS AB 1 TP1 96 (SUTURE) IMPLANT
SUT PROLENE 2 0 KS (SUTURE) ×4 IMPLANT
SUT PROLENE 2 0 SH DA (SUTURE) ×4 IMPLANT
SUT SILK 2 0 (SUTURE) ×2
SUT SILK 2 0 SH CR/8 (SUTURE) ×4 IMPLANT
SUT SILK 2-0 18XBRD TIE 12 (SUTURE) ×2 IMPLANT
SUT SILK 3 0 (SUTURE) ×2
SUT SILK 3 0 SH CR/8 (SUTURE) ×4 IMPLANT
SUT SILK 3-0 18XBRD TIE 12 (SUTURE) ×2 IMPLANT
SUT VIC AB 2-0 SH 27 (SUTURE) ×2
SUT VIC AB 2-0 SH 27X BRD (SUTURE) ×2 IMPLANT
SUT VIC AB 3-0 SH 18 (SUTURE) IMPLANT
SUT VIC AB 3-0 SH 27 (SUTURE)
SUT VIC AB 3-0 SH 27X BRD (SUTURE) IMPLANT
SUT VICRYL 0 UR6 27IN ABS (SUTURE) ×4 IMPLANT
SUT VICRYL 2 0 18  UND BR (SUTURE) ×2
SUT VICRYL 2 0 18 UND BR (SUTURE) ×2 IMPLANT
SYS LAPSCP GELPORT 120MM (MISCELLANEOUS)
SYSTEM LAPSCP GELPORT 120MM (MISCELLANEOUS) IMPLANT
TOWEL OR 17X26 10 PK STRL BLUE (TOWEL DISPOSABLE) IMPLANT
TOWEL OR NON WOVEN STRL DISP B (DISPOSABLE) ×4 IMPLANT
TRAY FOLEY MTR SLVR 16FR STAT (SET/KITS/TRAYS/PACK) ×4 IMPLANT
TRAY IRRIG W/60CC SYR STRL (SET/KITS/TRAYS/PACK) IMPLANT
TROCAR ADV FIXATION 5X100MM (TROCAR) ×4 IMPLANT
TROCAR XCEL BLUNT TIP 100MML (ENDOMECHANICALS) IMPLANT
TUBING CONNECTING 10 (TUBING) ×9 IMPLANT
TUBING CONNECTING 10' (TUBING) ×3
TUBING ENDO SMARTCAP PENTAX (MISCELLANEOUS) ×4 IMPLANT
TUBING INSUF HEATED (TUBING) ×4 IMPLANT

## 2017-11-27 NOTE — Consult Note (Signed)
H&P Physician requesting consult: Nadeen Landau, MD  Chief Complaint: Perforated diverticulitis  History of Present Illness: 52 year old male with perforated diverticulitis status post placement of a drain.  He is here today for laparoscopic versus open sigmoid colectomy.  Intraoperative stent placement was requested.  Patient has no complaints today.  Past Medical History:  Diagnosis Date  . Allergy   . Anemia   . CAD (coronary artery disease) of artery bypass graft 2009   Status post bypass grafting 2009 , status post stress echo 2012 no ischemia.  . Depression    past hx after heart surgery   . Dermatitis    Left upper extremity  . Diverticulitis 07/11/2017   Status post percutaneous drainage catheter placement for treatment  . Diverticulosis    Severe in sigmoid colon, Status post percutaneous drainage catheter placement for treatment  . Erectile dysfunction   . History of diverticular abscess 07/11/2017   Status post percutaneous drainage catheter placement for treatment, 07/30/17 drain removed  . History of kidney infection 1990  . Hx of adenomatous colonic polyps 09/02/2014  . Hyperlipidemia, mixed   . Hypotension, unspecified   . Myocardial infarction [facility])    per pt had 2 MI- 2009 both   . Numbness and tingling in left hand    S/p graft for CABG  . Sleep apnea    On 8 cm of water CPAP   Past Surgical History:  Procedure Laterality Date  . COLONOSCOPY  2016  . COLONOSCOPY  2007  . COLONOSCOPY W/ POLYPECTOMY  10/07/2017  . CORONARY ARTERY BYPASS GRAFT  2009   x4  . IR RADIOLOGIST EVAL & MGMT  07/30/2017    Home Medications:  Facility-Administered Medications Prior to Admission  Medication Dose Route Frequency Provider Last Rate Last Dose  . 0.9 %  sodium chloride infusion  500 mL Intravenous Once Gatha Mayer, MD       Medications Prior to Admission  Medication Sig Dispense Refill Last Dose  . acetaminophen (TYLENOL) 325 MG tablet Take 2 tablets (650 mg  total) by mouth every 6 (six) hours as needed for mild pain (or Fever >/= 101).   Past Month at Unknown time  . aspirin 81 MG tablet Take 81 mg by mouth every evening.    11/26/2017 at Unknown time  . Calcium Carbonate-Vitamin D (CALCIUM 500/D) 500-125 MG-UNIT TABS Take by mouth.   11/26/2017 at Unknown time  . cholecalciferol (VITAMIN D) 1000 units tablet Take 1,000 Units by mouth every evening.   11/26/2017 at Unknown time  . ibuprofen (ADVIL,MOTRIN) 200 MG tablet Take 400-600 mg by mouth 3 (three) times daily as needed for headache or moderate pain.   Past Month at Unknown time  . ipratropium (ATROVENT) 0.06 % nasal spray Place 2 sprays into both nostrils every 4 (four) hours as needed for rhinitis. 10 mL 6 Past Week at Unknown time  . montelukast (SINGULAIR) 10 MG tablet TAKE ONE TABLET DAILY AT BEDTIME 90 tablet 3 11/26/2017 at Unknown time  . Multiple Vitamin (MULTIVITAMIN) tablet Take 1 tablet by mouth every evening.    11/26/2017 at Unknown time  . Omega-3 Fatty Acids (FISH OIL) 1200 MG CAPS Take 1,200 mg by mouth every evening.   11/26/2017 at Unknown time  . rosuvastatin (CRESTOR) 40 MG tablet Take 1 tablet (40 mg total) by mouth daily. (Patient taking differently: Take 40 mg by mouth every evening. ) 90 tablet 3 11/26/2017 at Unknown time  . levocetirizine (XYZAL) 5 MG tablet  Take 1 tablet (5 mg total) by mouth every evening. (Patient not taking: Reported on 10/07/2017) 30 tablet 3 Not Taking at Unknown time   Allergies:  Allergies  Allergen Reactions  . Sulfonamide Derivatives Hives, Shortness Of Breath and Rash  . Oxycodone Hives    fever    Family History  Problem Relation Age of Onset  . Heart disease Mother   . Heart disease Father   . Colon cancer Paternal Uncle   . Kidney disease Other   . Coronary artery disease Other   . Heart failure Other   . Esophageal cancer Neg Hx   . Prostate cancer Neg Hx   . Rectal cancer Neg Hx   . Stomach cancer Neg Hx   . Colon polyps Neg Hx     Social History:  reports that he quit smoking about 12 years ago. His smoking use included cigarettes and cigars. He started smoking about 17 years ago. He has a 1.00 pack-year smoking history. He quit smokeless tobacco use about 20 years ago. His smokeless tobacco use included chew. He reports that he drinks about 9.0 oz of alcohol per week. He reports that he does not use drugs.  ROS: A complete review of systems was performed.  All systems are negative except for pertinent findings as noted. ROS   Physical Exam:  Vital signs in last 24 hours: Temp:  [98 F (36.7 C)] 98 F (36.7 C) (07/24 0851) Pulse Rate:  [80] 80 (07/24 0851) Resp:  [18] 18 (07/24 0851) BP: (139)/(93) 139/93 (07/24 0852) SpO2:  [100 %] 100 % (07/24 0851) Weight:  [105.2 kg (232 lb)] 105.2 kg (232 lb) (07/24 0914) General:  Alert and oriented, No acute distress HEENT: Normocephalic, atraumatic Neck: No JVD or lymphadenopathy Cardiovascular: Regular rate and rhythm Lungs: Regular rate and effort Abdomen: Soft, nontender, nondistended, no abdominal masses Back: No CVA tenderness Extremities: No edema Neurologic: Grossly intact  Laboratory Data:  No results found for this or any previous visit (from the past 24 hour(s)). No results found for this or any previous visit (from the past 240 hour(s)). Creatinine: Recent Labs    11/21/17 1427  CREATININE 0.98    Impression/Assessment:  Perforated diverticulitis  Plan:  Proceed with bilateral open-ended ureteral catheter placement.  Patient understands the risk including but not limited to bleeding, infection, injury to surrounding structures such as injury to the ureter, need for additional procedures.  Marton Redwood, III 11/27/2017, 10:09 AM

## 2017-11-27 NOTE — Anesthesia Procedure Notes (Signed)

## 2017-11-27 NOTE — Anesthesia Preprocedure Evaluation (Addendum)
Anesthesia Evaluation  Patient identified by MRN, date of birth, ID band Patient awake    Reviewed: Allergy & Precautions, NPO status , Patient's Chart, lab work & pertinent test results  History of Anesthesia Complications Negative for: history of anesthetic complications  Airway Mallampati: III  TM Distance: >3 FB Neck ROM: Full    Dental  (+) Teeth Intact, Dental Advisory Given   Pulmonary sleep apnea and Continuous Positive Airway Pressure Ventilation , former smoker,    Pulmonary exam normal breath sounds clear to auscultation       Cardiovascular hypertension, (-) angina+ CAD, + Past MI and + CABG  Normal cardiovascular exam Rhythm:Regular Rate:Normal  - CABG in 2009 4 vessel (LIMA-LAD, left radial to OM, SVG-diag, SVG-RCA) - 2012 DSE with baseline LVEF 55-60%, no ischemia   Neuro/Psych PSYCHIATRIC DISORDERS Depression negative neurological ROS     GI/Hepatic Neg liver ROS, DIVERTICULITIS   Endo/Other  negative endocrine ROS  Renal/GU negative Renal ROS     Musculoskeletal negative musculoskeletal ROS (+)   Abdominal   Peds  Hematology negative hematology ROS (+)   Anesthesia Other Findings Day of surgery medications reviewed with the patient.  Reproductive/Obstetrics                            Anesthesia Physical Anesthesia Plan  ASA: II  Anesthesia Plan: General   Post-op Pain Management:    Induction: Intravenous  PONV Risk Score and Plan: 2 and Dexamethasone, Ondansetron, Midazolam and Diphenhydramine  Airway Management Planned: Oral ETT  Additional Equipment:   Intra-op Plan:   Post-operative Plan: Extubation in OR  Informed Consent: I have reviewed the patients History and Physical, chart, labs and discussed the procedure including the risks, benefits and alternatives for the proposed anesthesia with the patient or authorized representative who has indicated  his/her understanding and acceptance.   Dental advisory given  Plan Discussed with: CRNA  Anesthesia Plan Comments: (2nd IV after induction.)       Anesthesia Quick Evaluation

## 2017-11-27 NOTE — Op Note (Signed)
PATIENT: [name]  52 y.o. male  Patient Care Team: Silverio Decamp, MD as PCP - General (Family Medicine)  PREOP DIAGNOSIS: Hx of complicated DIVERTICULITIS (with abscess)  POSTOP DIAGNOSIS: Same  PROCEDURE: Laparoscopic sigmoidectomy with double stapled colorectal anastomosis  SURGEON: Sharon Mt. Dema Severin, MD  ASSISTANT: Leighton Ruff, MD  ANESTHESIA: General endotracheal  EBL: 50cc Total I/O In: 1000 [I.V.:1000] Out: 55 [Urine:50; Blood:25]  DRAINS: None  SPECIMEN: Sigmoid colon  COUNTS: Sponge, needle and instrument counts were reported correct x2  FINDINGS:  Tortuous sigmoid colon with interloop adhesions likely from prior perforation/abscess.  A double stapled colorectal anastomosis was created with the 75mm short-handled Ethicon EEA stapler. The anastomosis is located 18cm from the anal verge by flexible endoscopy. The distal point of transection corresponded to the location where the tenia had already splayed, there were no appendices epiploicae it over layed the sacral promontory and was additionally at the location where the 3rd valve of [place] was located.  STATEMENT OF MEDICAL NECESSITY: [name] is a very pleasant 66yoM with hx of CAD (s/p CABG 2009), HLD with hx of diverticulitis - uncomplicated in 6440 and then recently admitted to [facility] 07/13/17 with 2nd attack - this one more severe. He had a perisigmoidal abscess that was in his RLQ and required percutaneous drain placement. He had associated sigmoid inflammation. Abscess was 3.7 x 2.8cm in size and he had a percutaneous drain successfully placed by IR. He recovered well and was discharged. Drain study 07/30/17 showed no evidence of fistula and the drain removed. Follow up colonoscopy 10/2017 showed 2 benign polyps that were removed, diverticulosis of sigmoid and associated narrowing of sigmoid. He was seen in the office and options were discussed moving forward. He opted to pursue surgery. Please  refer to H&P for details regarding this discussion.   NARRATIVE: Informed consent was verified. The patient was taken to the operating room, placed supine on the operating table and SCD's were applied. General endotracheal anesthesia was induced. The patient was then positioned in the lithotomy position with Allen stirrups.  Pressure points were padded and then verified. Urology then scrubbed; the patient was prepped and draped for placement of ureteral stents and cystoscopy.  Antibiotics had been administered.  Cystoscopy/stents was then performed - please refer to Dr. Purvis Sheffield dictation for details regarding this portion of the procedure. Hair on the abdomen was then clipped. The patient's abdomen was then prepped and draped in the standard sterile fashion. Surgical timeout confirmed our plan.   Abdominal entry was gained using the [facility] technique.  An infraumbilical incision was made.  This was carried down to the umbilical stalk which was dissected and grasped with a Kocher clamp.  The umbilical stalk was retracted outwardly. The midline infraumbilical fascia incised.  The peritoneum was then bluntly entered.  A stay suture of 0 Vicryl figure-of-eight was then placed.  The Hasson trocar was inserted into the peritoneal cavity and insufflation commenced to 68mmHg of CO2.  Camera inspection revealed no injury. Bilateral TAP blocks were then performed with a dilute mixture of 0.25% Marcaine with epinepherine and Exparel - (20cc Exparel, 30cc Marcaine) - 20cc per side.  Three additional 69mm ports were carefully placed under direct laparoscopic visualization.   The patient was positioned in Trendelenburg. The omentum and small bowel was reflected cephalad. Omental adhesions to the sigmoid colon were freed sharply.  Adhesions of the sigmoid colon to the sigmoid colon and sigmoid mesocolon were also mobilized sharply.  Following this, all  omentum was able to be reflected cephalad and the sigmoid was delivered  from the pelvis.  Attachments of the sigmoid colon to the intersigmoid fossa were divided.  Following this, the entire sigmoid colon was able to be reflected out of the pelvis.  The rectosigmoid colon was then elevated and the peritoneum scored over the sacral promontory at the level of the rectosigmoid colon.  The avascular plane was accessed and developed caudally to the proximal rectum and then cephalad.  Working cephalad, the left ureter was readily identified and swept down.  The gonadal vessels were identified and also swept down.  The dissection continued cephalad working up along the plane between the descending colon mesentery and retroperitoneum.  The IMA pedicle was identified.  This was then circumferentially dissected.  The peritoneum overlying the IMA was scored.  The location of the left ureter was again confirmed and confirmed to be down.  The IMA had been circumferentially dissected.  This was then divided approximately 2 cm from its takeoff from the aorta using the Enseal device, given that this was being done for benign disease.  The IMA pedicle was then inspected and noted to be hemostatic.  The patient was then positioned with left side up and the sigmoid and descending colon mobilized laterally taking down the Risha Barretta line of T[place]t.  The descending colon was mobilized up to the level of the splenic flexure.  The descending colon easily reached into the pelvis and remained freely in the pelvis without any tension at this level and therefore further mobilization was not necessary.  Attention was then turned to the distal point of transection.  The rectosigmoid colon was again identified and retracted anteriorly.  The proximal rectum was identified at the location where the tinea splayed, the appendices epiploica disappeared, and at a level overlying the sacral promontory.  This also corresponded endoscopically to the location of the third valve of [place].  A window was developed in the  mesorectum on the border of the rectum and the intervening mesentery ligated with the Enseal device.  The rectum was then transected using a Endo GIA green load stapling device.  This was done with one firing of the stapler.  The staple line was inspected and noted to be hemostatic.  Attention was then turned to the extracorporeal portion of the procedure.  The patient was taken out of steep Trendelenburg.  A Pfannenstiel incision was made approximately 2 fingerbreadths above the pubic symphysis.  This was carried down to the subcutaneous tissue electrocautery.  The rectus sheath was identified.  This was incised transversely.  The plane between the anterior sheath and the rectus muscle was developed cephalad and caudad.  The peritoneum was identified and opened in a longitudinal manner.  Care was taken to avoid any injury to the bladder.  An Alexis wound protector was placed and blue towels draped around the operative field.  The sigmoid colon was then delivered through the wound.  The proximal point of transection was selected at a location where there was clearly well-perfused colon and a palpable pulse in the marginal artery.  This was at the level of the descending colon.  A pursestring device was applied.  A 2-0 Prolene on a Keith needle was then passed.  The specimen was then divided and passed off. Proximal end is open, distal end stapled.  The pursestring device was removed and additional sutures of 3-0 silk were used to secure the pursestring.  EEA sizers were used and a 29  mm Ethicon EEA selected. The anvil was placed and the pursestring tied. A small amount of fat in the vicinity of the planned staple line was cleared. There were no diverticula at this level. The anvil was placed in the peritoneal cavity and a cap placed on the [place].  The abdomen was then reinsufflated.  The patient was then positioned in Trendelenburg.  I went down below to pass the EEA stapler.  The anus was gently dilated and EEA  sizers passed.  The 29 mm sizer passed without significant difficulty. The colon reached into the pelvis without tension and remained in place on its own.  The 29 mm stapler was then passed in the spike deployed just anterior to the staple line.  The components were mated and the stapler closed.  The orientation was confirmed and there was no twisting of the colon or small bowel underneath the mesentery.  The stapler was held and then subsequently fired.  The stapler was removed.  The colon proximal to the anastomosis was then gently occluded in the pelvis filled with sterile saline.  A flexible sigmoidoscope was then passed and inspection revealed a pink, hemostatic, airtight anastomosis that was located 18 cm from the anal verge by possible endoscopy.  At the level of the anastomosis, the third valve of [place] was noted. Both ends of the bowel at the level of the anastomosis were pink.  The irrigation was evacuated.  Hemostasis was verified.  The Alexis wound protector and laparoscopic ports were removed.  Attention was then turned to closing the abdomen.  The abdomen was redraped with sterile drapes.  All equipment was exchanged for clean equipment.  Gloves and gowns of all scrubbed participants were changed.  The wounds were then irrigated with sterile saline. The umbilical port site figure of eight suture was then tied. The fascia palpated and noted to be completely closed. The peritoneum was closed with a running 2-0 Vicryl suture.  Care was taken to again avoid any injury to the bladder.  The rectus fascia was then approximated with running #1 PDS suture.  The fascia was palpated and noted to be completely closed. The remaining local anesthetic was then infiltrated into the Pfannenstiel wound. The skin of all incision sites was closed with 4-0 Monocryl subcuticular sutures.  Dermabond was applied to all incisions and a sterile dressing placed over the Pfannenstiel incision.  The ureteral stents at the end of  the case were then removed and the Foley catheter left in place.   An MD assistant was necessary for tissue manipulation, retraction and positioning due to the complexity of the case, obesity of the patient and hospital policies  DISPOSITION: PACU in satisfactory condition

## 2017-11-27 NOTE — Transfer of Care (Signed)
Immediate Anesthesia Transfer of Care Note  Patient: [name]  Procedure(s) Performed: LAPAROSCOPIC SIGMOIDECTOMY (N/A ) FLEXIBLE SIGMOIDOSCOPY (N/A ) BILATERAL CYSTOSCOPY WITH RETROGRADE PYELOGRAM, BILATERAL URETERAL CATHETERIZATION (Bilateral )  Patient Location: PACU  Anesthesia Type:General  Level of Consciousness: awake, alert , oriented and patient cooperative  Airway & Oxygen Therapy: Patient Spontanous Breathing and Patient connected to face mask oxygen  Post-op Assessment: Report given to RN, Post -op Vital signs reviewed and stable and Patient moving all extremities  Post vital signs: Reviewed and stable  Last Vitals:  Vitals Value Taken Time  BP    Temp    Pulse 87 11/27/2017  2:23 PM  Resp 15 11/27/2017  2:23 PM  SpO2 100 % 11/27/2017  2:23 PM  Vitals shown include unvalidated device data.  Last Pain:  Vitals:   11/27/17 0914  TempSrc:   PainSc: 0-No pain      Patients Stated Pain Goal: 4 (58/09/98 3382)  Complications: No apparent anesthesia complications

## 2017-11-27 NOTE — H&P (Signed)
CC: Perforated diverticulitis s/p IR drain  HPI: [name] is a very pleasant 25yoM with hx of CAD (s/p CABG 2009), HLD here today for surgery. He was evaluated in the office following a recent admission for diverticulitis. He has a history of diverticulitis-uncomplicated in 5009. He was admitted to [facility] 07/13/2017 with right sided lower quadrant abdominal pain, nausea/vomiting. On evaluation, he was found to have perforated diverticulitis with a peri-colonic abscess 3.7 x 2.8cm in size with leukocytosis and associated sigmoid stranding/inflammation. He was admitted to hospital and interventional radiology consulted. He underwent percutaneous drain placement 07/12/17 uneventfully. He recovered well from this and was discharged from the hospital. He underwent drain check by IR 07/30/17 which demonstrated no fistula. IR removed his drain uneventfully.He denies any complaints today. He denies fever/chills/nausea/vomiting. He denies any changes in his bowel movements. He reports feeling well and is ready.   Colonoscopy-2016 with Dr. Caprice Red sessile polyps 2-10 mm in size in the ascending, descending colon and rectum. These were benign.  Repeat scope 10/2017 - 2 polyps removed, benign. Sigmoid narrowing and diverticulosis.   Past Medical History:  Diagnosis Date  . Allergy   . Anemia   . CAD (coronary artery disease) of artery bypass graft 2009   Status post bypass grafting 2009 , status post stress echo 2012 no ischemia.  . Depression    past hx after heart surgery   . Dermatitis    Left upper extremity  . Diverticulitis 07/11/2017   Status post percutaneous drainage catheter placement for treatment  . Diverticulosis    Severe in sigmoid colon, Status post percutaneous drainage catheter placement for treatment  . Erectile dysfunction   . History of diverticular abscess 07/11/2017   Status post percutaneous drainage catheter placement for treatment, 07/30/17 drain removed  .  History of kidney infection 1990  . Hx of adenomatous colonic polyps 09/02/2014  . Hyperlipidemia, mixed   . Hypotension, unspecified   . Myocardial infarction [facility])    per pt had 2 MI- 2009 both   . Numbness and tingling in left hand    S/p graft for CABG  . Sleep apnea    On 8 cm of water CPAP    Past Surgical History:  Procedure Laterality Date  . COLONOSCOPY  2016  . COLONOSCOPY  2007  . COLONOSCOPY W/ POLYPECTOMY  10/07/2017  . CORONARY ARTERY BYPASS GRAFT  2009   x4  . IR RADIOLOGIST EVAL & MGMT  07/30/2017    Family History  Problem Relation Age of Onset  . Heart disease Mother   . Heart disease Father   . Colon cancer Paternal Uncle   . Kidney disease Other   . Coronary artery disease Other   . Heart failure Other   . Esophageal cancer Neg Hx   . Prostate cancer Neg Hx   . Rectal cancer Neg Hx   . Stomach cancer Neg Hx   . Colon polyps Neg Hx     Social:  reports that he quit smoking about 12 years ago. His smoking use included cigarettes and cigars. He started smoking about 17 years ago. He has a 1.00 pack-year smoking history. He quit smokeless tobacco use about 20 years ago. His smokeless tobacco use included chew. He reports that he drinks about 9.0 oz of alcohol per week. He reports that he does not use drugs.  Allergies:  Allergies  Allergen Reactions  . Sulfonamide Derivatives Hives, Shortness Of Breath and Rash  . Oxycodone Hives  fever    Medications: I have reviewed the patient's current medications.  No results found for this or any previous visit (from the past 48 hour(s)).  No results found.  ROS - all of the below systems have been reviewed with the patient and positives are indicated with bold text General: chills, fever or night sweats Eyes: blurry vision or double vision ENT: epistaxis or sore throat Allergy/Immunology: itchy/watery eyes or nasal congestion Hematologic/Lymphatic: bleeding problems, blood clots or swollen lymph  nodes Endocrine: temperature intolerance or unexpected weight changes Breast: new or changing breast lumps or nipple discharge Resp: cough, shortness of breath, or wheezing CV: chest pain or dyspnea on exertion GI: as per HPI GU: dysuria, trouble voiding, or hematuria MSK: joint pain or joint stiffness Neuro: TIA or stroke symptoms Derm: pruritus and skin lesion changes Psych: anxiety and depression  PE Blood pressure (!) 139/93, pulse 80, temperature 98 F (36.7 C), temperature source Oral, resp. rate 18, height 6\' 3"  (1.905 m), weight 105.2 kg (232 lb), SpO2 100 %. Constitutional: NAD; conversant; no deformities Eyes: Moist conjunctiva; no lid lag; anicteric; PERRL Neck: Trachea midline; no thyromegaly Lungs: Normal respiratory effort; no tactile fremitus CV: RRR; no palpable thrills; no pitting edema GI: Abd soft, NT/ND; no palpable hepatosplenomegaly MSK: Normal gait; no clubbing/cyanosis Psychiatric: Appropriate affect; alert and oriented x3 Lymphatic: No palpable cervical or axillary lymphadenopathy   A/P: [name] is a very pleasant 10yoM with hx of OSA, CAD (s/p CABG 69yrs ago), HLD recently admitted with complicated diverticulitis with percutaneous drain placement - recovered well and drain removed - here today for surgery  -Colonoscopy completed; 2 small polyps removed, being. Sigmoid narrowing and diverticulosis -The anatomy and physiology of the GI tract was discussed at length with the patient and his wife. The pathophysiology of diverticulitis was discussed at length again today. The options for ongoing management including nonoperative were discussed with its associated risks including recurrent complicated diverticulitis with perforation and need for drain placement (or emergency surgery). -The planned procedure, material risks (including, but not limited to, pain, bleeding, infection, scarring, need for blood transfusion, damage to surrounding structures- blood  vessels/nerves/viscus/organs, damage to ureter, urine leak, leak from anastomosis, need for additional procedures, need for stoma which may be permanent, hernia, recurrence, pneumonia, heart attack, stroke, death) benefits and alternatives to surgery were discussed at length. The patient's and his wife's questions were answered to their satisfaction, they voiced understanding and elected to proceed with surgery. Additionally, we discussed typical postoperative expectations and the recovery process.  Sharon Mt. Dema Severin, M.D. [facility]

## 2017-11-27 NOTE — Op Note (Signed)
Operative Note  Preoperative diagnosis:  1.  Perforated diverticulitis  Post operative diagnosis: 1.  Perforated diverticulitis  Procedure(s): 1.  Cystoscopy with bilateral retrograde pyelogram and bilateral open-ended ureteral catheter placement  Surgeon: Link Snuffer, MD  Assistants: None  Anesthesia: General  Complications: None immediate  EBL: Minimal  Specimens: 1.  None  Drains/Catheters: 1.  Bilateral open-ended ureteral catheters 2.  Foley catheter  Intraoperative findings: 1.  Normal urethra and bladder 2.  Bilateral retrograde pyelogram revealed normal ureter and kidney without any filling defects  Indication: 52 year old male with a history of perforated diverticulitis presents for colectomy.  Intraoperative stent placement was requested.  Description of procedure:  The patient was identified and consent was obtained.  The patient was taken to the operating room and placed in the supine position.  The patient was placed under general anesthesia.  Perioperative antibiotics were administered.  The patient was placed in dorsal lithotomy.  Patient was prepped and draped in a standard sterile fashion and a timeout was performed.  A 21 French rigid cystoscope was advanced into the urethra and into the bladder.  The left distal most portion of the ureter was cannulated with an open-ended ureteral catheter.  Retrograde pyelogram was performed with the findings noted above.  A sensor wire was then advanced up to the kidney under fluoroscopic guidance.  The open-ended ureteral catheter was advanced over the wire up to the kidney and then the scope and wire were withdrawn keeping the open-ended ureteral catheter in place.   The right distal most portion of the ureter was cannulated with an open-ended ureteral catheter.  Retrograde pyelogram was performed with the findings noted above.  A sensor wire was then advanced up to the kidney under fluoroscopic guidance.  The open-ended  ureteral catheter was advanced over the wire up to the kidney and then the scope and wire were withdrawn keeping the open-ended ureteral catheter in place.Foley catheter was placed and both open-ended ureteral catheters were secured and threaded through into a drainage bag.  This concluded my portion of the operation.  Patient tolerated procedure well and the case was handed over to general surgery.  Plan: Per general surgery.  Urology will be available as needed.

## 2017-11-27 NOTE — Anesthesia Postprocedure Evaluation (Signed)
Anesthesia Post Note  Patient: [name]  Procedure(s) Performed: LAPAROSCOPIC SIGMOIDECTOMY (N/A ) FLEXIBLE SIGMOIDOSCOPY (N/A ) BILATERAL CYSTOSCOPY WITH RETROGRADE PYELOGRAM, BILATERAL URETERAL CATHETERIZATION (Bilateral )     Patient location during evaluation: PACU Anesthesia Type: General Level of consciousness: awake and alert Pain management: pain level controlled Vital Signs Assessment: post-procedure vital signs reviewed and stable Respiratory status: spontaneous breathing, nonlabored ventilation and respiratory function stable Cardiovascular status: blood pressure returned to baseline and stable Postop Assessment: no apparent nausea or vomiting Anesthetic complications: no    Last Vitals:  Vitals:   11/27/17 1515 11/27/17 1530  BP:  133/85  Pulse:  90  Resp:  15  Temp: 36.4 C 36.7 C  SpO2:  100%    Last Pain:  Vitals:   11/27/17 1530  TempSrc:   PainSc: 3                  Catalina Gravel

## 2017-11-28 ENCOUNTER — Encounter (HOSPITAL_COMMUNITY): Payer: Self-pay | Admitting: Surgery

## 2017-11-28 LAB — BASIC METABOLIC PANEL
ANION GAP: 8 (ref 5–15)
BUN: 12 mg/dL (ref 6–20)
CHLORIDE: 108 mmol/L (ref 98–111)
CO2: 23 mmol/L (ref 22–32)
CREATININE: 0.83 mg/dL (ref 0.61–1.24)
Calcium: 9.2 mg/dL (ref 8.9–10.3)
GFR calc non Af Amer: >60 mL/min (ref >60)
Glucose, Bld: 136 mg/dL — ABNORMAL HIGH (ref 70–99)
POTASSIUM: 4.3 mmol/L (ref 3.5–5.1)
SODIUM: 139 mmol/L (ref 135–145)

## 2017-11-28 LAB — PHOSPHORUS: Phosphorus: 3.8 mg/dL (ref 2.5–4.6)

## 2017-11-28 LAB — CBC
HCT: 40.3 % (ref 39.0–52.0)
Hemoglobin: 13.8 g/dL (ref 13.0–17.0)
MCH: 29.4 pg (ref 26.0–34.0)
MCHC: 34.2 g/dL (ref 30.0–36.0)
MCV: 85.9 fL (ref 78.0–100.0)
PLATELETS: 299 10*3/uL (ref 150–400)
RBC: 4.69 MIL/uL (ref 4.22–5.81)
RDW: 13.1 % (ref 11.5–15.5)
WBC: 17 10*3/uL — AB (ref 4.0–10.5)

## 2017-11-28 LAB — MAGNESIUM: Magnesium: 2 mg/dL (ref 1.7–2.4)

## 2017-11-28 NOTE — Progress Notes (Signed)
Subjective No acute events. Doing well. Feeling better than he expected. Denies n/v. Passing flatus. Walked 2 laps last night. Reports good pain control.  Objective: Vital signs in last 24 hours: Temp:  [97.5 F (36.4 C)-98.6 F (37 C)] 97.9 F (36.6 C) (07/25 0548) Pulse Rate:  [73-97] 73 (07/25 0548) Resp:  [15-18] 18 (07/25 0548) BP: (98-139)/(62-93) 115/78 (07/25 0548) SpO2:  [92 %-100 %] 92 % (07/25 0548) Weight:  [105.2 kg (232 lb)] 105.2 kg (232 lb) (07/24 0914) Last BM Date: 11/27/17  Intake/Output from previous day: 07/24 0701 - 07/25 0700 In: 4525 [P.O.:1450; I.V.:2975; IV Piggyback:100] Out: 3125 [Urine:3100; Blood:25] Intake/Output this shift: No intake/output data recorded.  Gen: NAD, comfortable CV: RRR Pulm: Normal work of breathing Abd: Incisions c/d/i without erythema. Soft, mildly distended, nontender. No rebound nor guarding. Ext: SCDs in place  Lab Results: CBC  Recent Labs    11/28/17 0440  WBC 17.0*  HGB 13.8  HCT 40.3  PLT 299   BMET Recent Labs    11/28/17 0440  NA 139  K 4.3  CL 108  CO2 23  GLUCOSE 136*  BUN 12  CREATININE 0.83  CALCIUM 9.2   PT/INR No results for input(s): LABPROT, INR in the last 72 hours. ABG No results for input(s): PHART, HCO3 in the last 72 hours.  Invalid input(s): PCO2, PO2  Studies/Results:  Anti-infectives: Anti-infectives (From admission, onward)   Start     Dose/Rate Route Frequency Ordered Stop   11/27/17 1011  sodium chloride 0.9 % with cefoTEtan (CEFOTAN) ADS Med    Note to Pharmacy:  Bridget Hartshorn   : cabinet override      11/27/17 1011 11/27/17 1054   11/27/17 0900  cefoTEtan (CEFOTAN) 2 g in sodium chloride 0.9 % 100 mL IVPB     2 g 200 mL/hr over 30 Minutes Intravenous On call to O.R. 11/27/17 0854 11/27/17 1124       Assessment/Plan: Patient Active Problem List   Diagnosis Date Noted  . S/P laparoscopic-assisted sigmoidectomy 11/27/2017  . Diverticulitis of large intestine  with abscess without bleeding 07/11/2017  . Chronic cough 07/07/2015  . Prostate nodule 05/19/2015  . Hx of adenomatous colonic polyps 09/02/2014  . Annual physical exam 05/20/2014  . Allergic rhinitis 11/17/2013  . Erectile dysfunction   . Sleep apnea   . CAD (coronary artery disease) of artery bypass graft   . Essential hypertension, benign 07/05/2009  . Hyperlipidemia 01/18/2009   s/p Procedure(s): LAPAROSCOPIC SIGMOIDECTOMY FLEXIBLE SIGMOIDOSCOPY BILATERAL CYSTOSCOPY WITH RETROGRADE PYELOGRAM, BILATERAL URETERAL CATHETERIZATION 11/27/2017  -Recovering well -Advanced to full liquids -WBC 17 today, expected postop leukocytosis; repeat cbc tomorrow -Continue Entereg -Ambulate 5x/day -D/C IVF -Continue tylenol + ibuprofen; prn tramadol if tylenol/ibuprofen not sufficient -Continue foley today - will plan to remove tomorrow -Dispo: Pending bowel movement, toleration of regular diet   LOS: 1 day   Sharon Mt. Dema Severin, M.D. General and Colorectal Surgery [facility]

## 2017-11-29 LAB — CBC
HEMATOCRIT: 42.3 % (ref 39.0–52.0)
Hemoglobin: 14.5 g/dL (ref 13.0–17.0)
MCH: 29.5 pg (ref 26.0–34.0)
MCHC: 34.3 g/dL (ref 30.0–36.0)
MCV: 86.2 fL (ref 78.0–100.0)
PLATELETS: 273 10*3/uL (ref 150–400)
RBC: 4.91 MIL/uL (ref 4.22–5.81)
RDW: 13.1 % (ref 11.5–15.5)
WBC: 14.9 10*3/uL — AB (ref 4.0–10.5)

## 2017-11-29 LAB — BASIC METABOLIC PANEL
Anion gap: 5 (ref 5–15)
BUN: 13 mg/dL (ref 6–20)
CALCIUM: 8.7 mg/dL — AB (ref 8.9–10.3)
CO2: 25 mmol/L (ref 22–32)
Chloride: 111 mmol/L (ref 98–111)
Creatinine, Ser: 0.8 mg/dL (ref 0.61–1.24)
GFR calc Af Amer: >60 mL/min (ref >60)
Glucose, Bld: 107 mg/dL — ABNORMAL HIGH (ref 70–99)
POTASSIUM: 3.9 mmol/L (ref 3.5–5.1)
SODIUM: 141 mmol/L (ref 135–145)

## 2017-11-29 LAB — MAGNESIUM: MAGNESIUM: 2.2 mg/dL (ref 1.7–2.4)

## 2017-11-29 LAB — PHOSPHORUS: PHOSPHORUS: 2.4 mg/dL — AB (ref 2.5–4.6)

## 2017-11-29 MED ORDER — K PHOS MONO-SOD PHOS DI & MONO 155-852-130 MG PO TABS
250.0000 mg | ORAL_TABLET | Freq: Once | ORAL | Status: AC
  Administered 2017-11-29: 250 mg via ORAL
  Filled 2017-11-29: qty 1

## 2017-11-29 NOTE — Progress Notes (Signed)
rm 0518, [name],  74yom, Had SIGMOIDECTOMY by Dr.White on 7/24.[name] Needs orders for C-PAP at night, has home mask, but no orders.  Thanks.

## 2017-11-29 NOTE — Progress Notes (Signed)
Subjective Had 1 bout of right sided abdominal pain with ambulation yesterday evening - got a dose of dilaudid and felt better. Hasn't had pain since. Tolerating soft diet without n/v. Passing flatus and had loose BM this morning. Walking multiple times per day.   Objective: Vital signs in last 24 hours: Temp:  [97.7 F (36.5 C)-98.8 F (37.1 C)] 98.1 F (36.7 C) (07/26 0556) Pulse Rate:  [59-78] 62 (07/26 0556) Resp:  [14-18] 18 (07/26 0556) BP: (117-126)/(76-85) 125/77 (07/26 0556) SpO2:  [96 %-100 %] 98 % (07/26 0556) Weight:  [105.2 kg (232 lb)-107.8 kg (237 lb 10.5 oz)] 107.8 kg (237 lb 10.5 oz) (07/26 0556) Last BM Date: 11/27/17  Intake/Output from previous day: 07/25 0701 - 07/26 0700 In: 2130 [P.O.:1080; I.V.:1050] Out: 5251 [Urine:5250; Stool:1] Intake/Output this shift: No intake/output data recorded.  Gen: NAD, comfortable CV: RRR Pulm: Normal work of breathing Abd: Incisions c/d/i without erythema. Soft, mildly distended, nontender. No rebound nor guarding. Ext: SCDs in place  Lab Results: CBC  Recent Labs    11/28/17 0440 11/29/17 0428  WBC 17.0* 14.9*  HGB 13.8 14.5  HCT 40.3 42.3  PLT 299 273   BMET Recent Labs    11/28/17 0440 11/29/17 0428  NA 139 141  K 4.3 3.9  CL 108 111  CO2 23 25  GLUCOSE 136* 107*  BUN 12 13  CREATININE 0.83 0.80  CALCIUM 9.2 8.7*   PT/INR No results for input(s): LABPROT, INR in the last 72 hours. ABG No results for input(s): PHART, HCO3 in the last 72 hours.  Invalid input(s): PCO2, PO2  Studies/Results:  Anti-infectives: Anti-infectives (From admission, onward)   Start     Dose/Rate Route Frequency Ordered Stop   11/27/17 1011  sodium chloride 0.9 % with cefoTEtan (CEFOTAN) ADS Med    Note to Pharmacy:  Bridget Hartshorn   : cabinet override      11/27/17 1011 11/27/17 1054   11/27/17 0900  cefoTEtan (CEFOTAN) 2 g in sodium chloride 0.9 % 100 mL IVPB     2 g 200 mL/hr over 30 Minutes Intravenous On call  to O.R. 11/27/17 0854 11/27/17 1124       Assessment/Plan: Patient Active Problem List   Diagnosis Date Noted  . S/P laparoscopic-assisted sigmoidectomy 11/27/2017  . Diverticulitis of large intestine with abscess without bleeding 07/11/2017  . Chronic cough 07/07/2015  . Prostate nodule 05/19/2015  . Hx of adenomatous colonic polyps 09/02/2014  . Annual physical exam 05/20/2014  . Allergic rhinitis 11/17/2013  . Erectile dysfunction   . Sleep apnea   . CAD (coronary artery disease) of artery bypass graft   . Essential hypertension, benign 07/05/2009  . Hyperlipidemia 01/18/2009   s/p Procedure(s): LAPAROSCOPIC SIGMOIDECTOMY FLEXIBLE SIGMOIDOSCOPY BILATERAL CYSTOSCOPY WITH RETROGRADE PYELOGRAM, BILATERAL URETERAL CATHETERIZATION 11/27/2017  -Recovering well -WBC downtrending - 14.9 from 17 today -Continue soft diet - ate dinner and eating breakfast now -Foley out this morning - pending spontaneous void -Ambulate 5x/day -Continue tylenol + ibuprofen; prn tramadol if tylenol/ibuprofen not sufficient -Dispo: Will see how he is feeling later today and make assessment of possible discharge   LOS: 2 days   Sharon Mt. Dema Severin, M.D. General and Colorectal Surgery [facility]

## 2017-11-29 NOTE — Progress Notes (Signed)
Pt's CPAP was set up 8CM. Pt has his on tubing and mask.

## 2017-11-30 LAB — CBC
HEMATOCRIT: 41.6 % (ref 39.0–52.0)
HEMOGLOBIN: 14.1 g/dL (ref 13.0–17.0)
MCH: 29.2 pg (ref 26.0–34.0)
MCHC: 33.9 g/dL (ref 30.0–36.0)
MCV: 86.1 fL (ref 78.0–100.0)
Platelets: 255 10*3/uL (ref 150–400)
RBC: 4.83 MIL/uL (ref 4.22–5.81)
RDW: 13.2 % (ref 11.5–15.5)
WBC: 12.7 10*3/uL — ABNORMAL HIGH (ref 4.0–10.5)

## 2017-11-30 LAB — BASIC METABOLIC PANEL
Anion gap: 7 (ref 5–15)
BUN: 15 mg/dL (ref 6–20)
CALCIUM: 8.6 mg/dL — AB (ref 8.9–10.3)
CHLORIDE: 107 mmol/L (ref 98–111)
CO2: 24 mmol/L (ref 22–32)
Creatinine, Ser: 0.89 mg/dL (ref 0.61–1.24)
GFR calc Af Amer: >60 mL/min (ref >60)
Glucose, Bld: 107 mg/dL — ABNORMAL HIGH (ref 70–99)
Potassium: 3.8 mmol/L (ref 3.5–5.1)
SODIUM: 138 mmol/L (ref 135–145)

## 2017-11-30 LAB — PHOSPHORUS: PHOSPHORUS: 3.3 mg/dL (ref 2.5–4.6)

## 2017-11-30 LAB — MAGNESIUM: MAGNESIUM: 2.2 mg/dL (ref 1.7–2.4)

## 2017-11-30 MED ORDER — TRAMADOL HCL 50 MG PO TABS: 50.0000 mg | ORAL_TABLET | Freq: Four times a day (QID) | ORAL | 0 refills | Status: AC | PRN

## 2017-11-30 NOTE — Progress Notes (Signed)
Assessment unchanged. Pt and wife verbalized understanding of dc instructions through teach back including follow up care, when to call the doctor, and meds to resume. Script x 1 given per MD. Discharged via wc to front entrance accompanied by NT and wife.

## 2017-11-30 NOTE — Discharge Instructions (Signed)
POST OP INSTRUCTIONS  1. DIET: As tolerated. Follow a light bland diet the first 24 hours after arrival home, such as soup, liquids, crackers, etc.  Be sure to include lots of fluids daily.  Avoid fast food or heavy meals as your are more likely to get nauseated.  Eat a low fat the next few days after surgery.  2. Take your usually prescribed home medications unless otherwise directed.  3. PAIN CONTROL: a. Pain is best controlled by a usual combination of three different methods TOGETHER: i. Ice/Heat ii. Over the counter pain medication iii. Prescription pain medication b. Most patients will experience some swelling and bruising around the surgical site.  Ice packs or heating pads (30-60 minutes up to 6 times a day) will help. Some people prefer to use ice alone, heat alone, alternating between ice & heat.  Experiment to what works for you.  Swelling and bruising can take several weeks to resolve.   c. It is helpful to take an over-the-counter pain medication regularly for the first few weeks: i. Ibuprofen (Motrin/Advil) - 200mg tabs - take 3 tabs (600mg) every 6 hours as needed for pain ii. Acetaminophen (Tylenol) - you may take 650mg every 6 hours as needed. You can take this with motrin as they act differently on the body. If you are taking a narcotic pain medication that has acetaminophen in it, do not take over the counter tylenol at the same time.  Iii. NOTE: You may take both of these medications together - most patients  find it most helpful when alternating between the two (i.e. Ibuprofen at 6am,  tylenol at 9am, ibuprofen at 12pm ...) d. A  prescription for pain medication should be given to you upon discharge.  Take your pain medication as prescribed if your pain is not adequatly controlled with the over-the-counter pain reliefs mentioned above.  4. Avoid getting constipated.  Between the surgery and the pain medications, it is common to experience some constipation.  Increasing fluid  intake and taking a fiber supplement (such as Metamucil, Citrucel, FiberCon, MiraLax, etc) 1-2 times a day regularly will usually help prevent this problem from occurring.  A mild laxative (prune juice, Milk of Magnesia, MiraLax, etc) should be taken according to package directions if there are no bowel movements after 48 hours.    5. Dressing: Your incisions are covered in Dermabond which is like sterile superglue for the skin. This will come off on it's own in a couple weeks. It is waterproof and you may bathe normally starting the day after your surgery in a shower. Avoid baths/pools/lakes/oceans until your wounds have fully healed.  6. ACTIVITIES as tolerated:   a. Avoid heavy lifting (>10lbs or 1 gallon of milk) for the next 6 weeks. b. You may resume regular (light) daily activities beginning the next day--such as daily self-care, walking, climbing stairs--gradually increasing activities as tolerated.  If you can walk 30 minutes without difficulty, it is safe to try more intense activity such as jogging, treadmill, bicycling, low-impact aerobics.  c. DO NOT PUSH THROUGH PAIN.  Let pain be your guide: If it hurts to do something, don't do it. d. You may drive when you are no longer taking prescription pain medication, you can comfortably wear a seatbelt, and you can safely maneuver your car and apply brakes.   7. FOLLOW UP in our office a. Please call [facility] at [phone] to set up an appointment to see your surgeon in the office for a follow-up   appointment approximately 2 weeks after your surgery. b. Make sure that you call for this appointment the day you arrive home to insure a convenient appointment time.  9. If you have disability or family leave forms that need to be completed, you may have them completed by your primary care physician's office; for return to work instructions, please ask our office staff and they will be happy to assist you in obtaining this documentation   When to call  us [phone]: 1. Poor pain control 2. Reactions / problems with new medications (rash/itching, etc)  3. Fever over 101.5 F (38.5 C) 4. Inability to urinate 5. Nausea/vomiting 6. Worsening swelling or bruising 7. Continued bleeding from incision. 8. Increased pain, redness, or drainage from the incision  The clinic staff is available to answer your questions during regular business hours (8:30am-5pm).  Please don't hesitate to call and ask to speak to one of our nurses for clinical concerns.   A surgeon from [facility] is always on call at the hospitals   If you have a medical emergency, go to the nearest emergency room or call 911.  [facility] [address], [address], [place], [place]  [address] MAIN: [phone] FAX: [phone] [email or web address] 

## 2017-11-30 NOTE — Discharge Summary (Signed)
Patient ID: [name] MRN: [id number] DOB/AGE: 07-25-1965 52 y.o.  Admit date: 11/27/2017 Discharge date: 11/30/2017  Discharge Diagnoses Patient Active Problem List   Diagnosis Date Noted  . S/P laparoscopic-assisted sigmoidectomy 11/27/2017  . Diverticulitis of large intestine with abscess without bleeding 07/11/2017  . Chronic cough 07/07/2015  . Prostate nodule 05/19/2015  . Hx of adenomatous colonic polyps 09/02/2014  . Annual physical exam 05/20/2014  . Allergic rhinitis 11/17/2013  . Erectile dysfunction   . Sleep apnea   . CAD (coronary artery disease) of artery bypass graft   . Essential hypertension, benign 07/05/2009  . Hyperlipidemia 01/18/2009    Consultants None  Procedures Laparoscopic sigmoidectomy with cysto/stents 11/27/17  Hospital Course: Patient was admitted to the hospital postoperatively and recovered well. He began passing flatus and having BMs. On POD#3 (7/27), the patient had been reliably tolerating a regular diet, having bowel function, ambulating and his pain was well controlled on oral analgesics. His foley was out and he was voiding spontaneously. His WBC was elevated postoperatively but down-trending each day. Vitals were normal throughout. He was deemed stable for discharge home 7/27.   Allergies as of 11/30/2017      Reactions   Sulfonamide Derivatives Hives, Shortness Of Breath, Rash   Oxycodone Hives   fever      Medication List    TAKE these medications   acetaminophen 325 MG tablet Commonly known as:  TYLENOL Take 2 tablets (650 mg total) by mouth every 6 (six) hours as needed for mild pain (or Fever >/= 101).   aspirin 81 MG tablet Take 81 mg by mouth every evening.   Calcium 500/D 500-125 MG-UNIT Tabs Generic drug:  Calcium Carbonate-Vitamin D Take by mouth.   cholecalciferol 1000 units tablet Commonly known as:  VITAMIN D Take 1,000 Units by mouth every evening.   Fish Oil 1200 MG Caps Take 1,200 mg by mouth every  evening.   ibuprofen 200 MG tablet Commonly known as:  ADVIL,MOTRIN Take 400-600 mg by mouth 3 (three) times daily as needed for headache or moderate pain.   ipratropium 0.06 % nasal spray Commonly known as:  ATROVENT Place 2 sprays into both nostrils every 4 (four) hours as needed for rhinitis.   levocetirizine 5 MG tablet Commonly known as:  XYZAL Take 1 tablet (5 mg total) by mouth every evening.   montelukast 10 MG tablet Commonly known as:  SINGULAIR TAKE ONE TABLET DAILY AT BEDTIME   multivitamin tablet Take 1 tablet by mouth every evening.   rosuvastatin 40 MG tablet Commonly known as:  CRESTOR Take 1 tablet (40 mg total) by mouth daily. What changed:  when to take this   traMADol 50 MG tablet Commonly known as:  ULTRAM Take 1 tablet (50 mg total) by mouth every 6 (six) hours as needed for up to 5 days (postop pain not controlled with tylenol and ibuprofen).          Sharon Mt. Dema Severin, M.D. [facility]

## 2018-03-11 ENCOUNTER — Encounter: Payer: Self-pay | Admitting: Family Medicine

## 2018-03-11 ENCOUNTER — Ambulatory Visit (INDEPENDENT_AMBULATORY_CARE_PROVIDER_SITE_OTHER): Payer: No Typology Code available for payment source | Admitting: Family Medicine

## 2018-03-11 ENCOUNTER — Ambulatory Visit: Payer: No Typology Code available for payment source | Admitting: Sports Medicine

## 2018-03-11 VITALS — BP 129/76 | HR 98 | Temp 98.2°F | Wt 232.0 lb

## 2018-03-11 DIAGNOSIS — J0100 Acute maxillary sinusitis, unspecified: Secondary | ICD-10-CM | POA: Diagnosis not present

## 2018-03-11 MED ORDER — PREDNISONE 10 MG PO TABS: 30.0000 mg | ORAL_TABLET | Freq: Every day | ORAL | 0 refills | Status: DC

## 2018-03-11 MED ORDER — AZITHROMYCIN 250 MG PO TABS: 250.0000 mg | ORAL_TABLET | Freq: Every day | ORAL | 0 refills | Status: DC

## 2018-03-11 MED ORDER — BENZONATATE 200 MG PO CAPS: 200.0000 mg | ORAL_CAPSULE | Freq: Three times a day (TID) | ORAL | 0 refills | Status: DC | PRN

## 2018-03-11 NOTE — Patient Instructions (Addendum)
Thank you for coming in today. Continue over the counter medicine.  Take prednsone and cough pills.  If worsening fill and take azithromycin antibiotic.  Recheck as needed.   Call or go to the emergency room if you get worse, have trouble breathing, have chest pains, or palpitations.   Schedule a nurse visit in about 1 month for Tdap and Flu shot.    Sinusitis, Adult Sinusitis is soreness and inflammation of your sinuses. Sinuses are hollow spaces in the bones around your face. Your sinuses are located:  Around your eyes.  In the middle of your forehead.  Behind your nose.  In your cheekbones.  Your sinuses and nasal passages are lined with a stringy fluid (mucus). Mucus normally drains out of your sinuses. When your nasal tissues become inflamed or swollen, the mucus can become trapped or blocked so air cannot flow through your sinuses. This allows bacteria, viruses, and funguses to grow, which leads to infection. Sinusitis can develop quickly and last for 7?10 days (acute) or for more than 12 weeks (chronic). Sinusitis often develops after a cold. What are the causes? This condition is caused by anything that creates swelling in the sinuses or stops mucus from draining, including:  Allergies.  Asthma.  Bacterial or viral infection.  Abnormally shaped bones between the nasal passages.  Nasal growths that contain mucus (nasal polyps).  Narrow sinus openings.  Pollutants, such as chemicals or irritants in the air.  A foreign object stuck in the nose.  A fungal infection. This is rare.  What increases the risk? The following factors may make you more likely to develop this condition:  Having allergies or asthma.  Having had a recent cold or respiratory tract infection.  Having structural deformities or blockages in your nose or sinuses.  Having a weak immune system.  Doing a lot of swimming or diving.  Overusing nasal sprays.  Smoking.  What are the signs  or symptoms? The main symptoms of this condition are pain and a feeling of pressure around the affected sinuses. Other symptoms include:  Upper toothache.  Earache.  Headache.  Bad breath.  Decreased sense of smell and taste.  A cough that may get worse at night.  Fatigue.  Fever.  Thick drainage from your nose. The drainage is often green and it may contain pus (purulent).  Stuffy nose or congestion.  Postnasal drip. This is when extra mucus collects in the throat or back of the nose.  Swelling and warmth over the affected sinuses.  Sore throat.  Sensitivity to light.  How is this diagnosed? This condition is diagnosed based on symptoms, a medical history, and a physical exam. To find out if your condition is acute or chronic, your health care provider may:  Look in your nose for signs of nasal polyps.  Tap over the affected sinus to check for signs of infection.  View the inside of your sinuses using an imaging device that has a light attached (endoscope).  If your health care provider suspects that you have chronic sinusitis, you may also:  Be tested for allergies.  Have a sample of mucus taken from your nose (nasal culture) and checked for bacteria.  Have a mucus sample examined to see if your sinusitis is related to an allergy.  If your sinusitis does not respond to treatment and it lasts longer than 8 weeks, you may have an MRI or CT scan to check your sinuses. These scans also help to determine how severe  your infection is. In rare cases, a bone biopsy may be done to rule out more serious types of fungal sinus disease. How is this treated? Treatment for sinusitis depends on the cause and whether your condition is chronic or acute. If a virus is causing your sinusitis, your symptoms will go away on their own within 10 days. You may be given medicines to relieve your symptoms, including:  Topical nasal decongestants. They shrink swollen nasal passages and let  mucus drain from your sinuses.  Antihistamines. These drugs block inflammation that is triggered by allergies. This can help to ease swelling in your nose and sinuses.  Topical nasal corticosteroids. These are nasal sprays that ease inflammation and swelling in your nose and sinuses.  Nasal saline washes. These rinses can help to get rid of thick mucus in your nose.  If your condition is caused by bacteria, you will be given an antibiotic medicine. If your condition is caused by a fungus, you will be given an antifungal medicine. Surgery may be needed to correct underlying conditions, such as narrow nasal passages. Surgery may also be needed to remove polyps. Follow these instructions at home: Medicines  Take, use, or apply over-the-counter and prescription medicines only as told by your health care provider. These may include nasal sprays.  If you were prescribed an antibiotic medicine, take it as told by your health care provider. Do not stop taking the antibiotic even if you start to feel better. Hydrate and Humidify  Drink enough water to keep your urine clear or pale yellow. Staying hydrated will help to thin your mucus.  Use a cool mist humidifier to keep the humidity level in your home above 50%.  Inhale steam for 10-15 minutes, 3-4 times a day or as told by your health care provider. You can do this in the bathroom while a hot shower is running.  Limit your exposure to cool or dry air. Rest  Rest as much as possible.  Sleep with your head raised (elevated).  Make sure to get enough sleep each night. General instructions  Apply a warm, moist washcloth to your face 3-4 times a day or as told by your health care provider. This will help with discomfort.  Wash your hands often with soap and water to reduce your exposure to viruses and other germs. If soap and water are not available, use hand sanitizer.  Do not smoke. Avoid being around people who are smoking (secondhand  smoke).  Keep all follow-up visits as told by your health care provider. This is important. Contact a health care provider if:  You have a fever.  Your symptoms get worse.  Your symptoms do not improve within 10 days. Get help right away if:  You have a severe headache.  You have persistent vomiting.  You have pain or swelling around your face or eyes.  You have vision problems.  You develop confusion.  Your neck is stiff.  You have trouble breathing. This information is not intended to replace advice given to you by your health care provider. Make sure you discuss any questions you have with your health care provider. Document Released: 04/23/2005 Document Revised: 12/18/2015 Document Reviewed: 02/16/2015 Elsevier Interactive Patient Education  [employer].

## 2018-03-11 NOTE — Progress Notes (Signed)
[name] is a 52 y.o. male who presents to [facility] today for cough congestion runny nose sinus pain and pressure.  Symptoms present for about 4 days.  Patient tried over-the-counter medications which help a little.  He notes cough is productive and does interfere with sleep.  No significant shortness of breath or severe wheezing.  No chest pain palpitations shortness of breath.   ROS as above:  Exam:  BP 129/76   Pulse 98   Temp 98.2 F (36.8 C) (Oral)   Wt 232 lb (105.2 kg)   SpO2 96%   BMI 29.00 kg/m  Wt Readings from Last 5 Encounters:  03/11/18 232 lb (105.2 kg)  11/30/17 237 lb 9.6 oz (107.8 kg)  11/21/17 232 lb 2 oz (105.3 kg)  10/07/17 232 lb (105.2 kg)  10/02/17 228 lb 6.4 oz (103.6 kg)    Gen: Well NAD HEENT: EOMI,  MMM clear nasal discharge with inflamed nasal turbinates bilaterally.  Posterior pharynx with cobblestoning.  Normal tympanic membranes bilaterally.  Mild cervical lymphadenopathy present bilaterally.  Mild tender palpation maxillary sinuses bilaterally Lungs: Normal work of breathing. CTABL Heart: RRR no MRG Abd: NABS, Soft. Nondistended, Nontender Exts: Brisk capillary refill, warm and well perfused.   Lab and Radiology Results No results found for this or any previous visit (from the past 72 hour(s)). No results found.    Assessment and Plan: 52 y.o. male with sinusitis likely viral.  Continue over-the-counter prescribed prednisone and Tessalon Perles as well.  Back-up azithromycin prescription printed and use if not improving or if worsening.   No orders of the defined types were placed in this encounter.  Meds ordered this encounter  Medications  . predniSONE (DELTASONE) 10 MG tablet    Sig: Take 3 tablets (30 mg total) by mouth daily with breakfast.    Dispense:  15 tablet    Refill:  0  . benzonatate  (TESSALON) 200 MG capsule    Sig: Take 1 capsule (200 mg total) by mouth 3 (three) times daily as needed for cough.    Dispense:  45 capsule    Refill:  0  . azithromycin (ZITHROMAX) 250 MG tablet    Sig: Take 1 tablet (250 mg total) by mouth daily. Take first 2 tablets together, then 1 every day until finished.    Dispense:  6 tablet    Refill:  0     Historical information moved to improve visibility of documentation.  Past Medical History:  Diagnosis Date  . Allergy   . Anemia   . CAD (coronary artery disease) of artery bypass graft 2009   Status post bypass grafting 2009 , status post stress echo 2012 no ischemia.  . Depression    past hx after heart surgery   . Dermatitis    Left upper extremity  . Diverticulitis 07/11/2017   Status post percutaneous drainage catheter placement for treatment  . Diverticulosis    Severe in sigmoid colon, Status post percutaneous drainage catheter placement for treatment  . Erectile dysfunction   . History of diverticular abscess 07/11/2017   Status post percutaneous drainage catheter placement for treatment, 07/30/17 drain removed  . History of kidney infection 1990  . Hx of adenomatous colonic polyps 09/02/2014  . Hyperlipidemia, mixed   . Hypotension, unspecified   . Myocardial infarction [facility])    per pt had 2 MI- 2009 both   . Numbness and tingling  in left hand    S/p graft for CABG  . Sleep apnea    On 8 cm of water CPAP   Past Surgical History:  Procedure Laterality Date  . COLONOSCOPY  2016  . COLONOSCOPY  2007  . COLONOSCOPY W/ POLYPECTOMY  10/07/2017  . CORONARY ARTERY BYPASS GRAFT  2009   x4  . CYSTOSCOPY W/ RETROGRADES Bilateral 11/27/2017   Procedure: BILATERAL CYSTOSCOPY WITH RETROGRADE PYELOGRAM, BILATERAL URETERAL CATHETERIZATION;  Surgeon: Lucas Mallow, MD;  Location: [facility];  Service: Urology;  Laterality: Bilateral;  . FLEXIBLE SIGMOIDOSCOPY N/A 11/27/2017   Procedure: FLEXIBLE SIGMOIDOSCOPY;  Surgeon: Ileana Roup, MD;  Location: [facility];  Service: General;  Laterality: N/A;  . IR RADIOLOGIST EVAL & MGMT  07/30/2017  . LAPAROSCOPIC SIGMOID COLECTOMY N/A 11/27/2017   Procedure: LAPAROSCOPIC SIGMOIDECTOMY;  Surgeon: Ileana Roup, MD;  Location: [facility];  Service: General;  Laterality: N/A;   Social History   Tobacco Use  . Smoking status: Former Smoker    Packs/day: 0.20    Years: 5.00    Pack years: 1.00    Types: Cigarettes, Cigars    Start date: 05/28/2000    Last attempt to quit: 05/07/2005    Years since quitting: 12.8  . Smokeless tobacco: Former Systems developer    Types: Chew    Quit date: 05/07/1997  . Tobacco comment: chewed tobacco x 20 yrs - quit 2000  Substance Use Topics  . Alcohol use: Yes    Alcohol/week: 15.0 standard drinks    Types: 10 Shots of liquor, 5 Standard drinks or equivalent per week    Comment: beer & liquor   family history includes Colon cancer in his paternal uncle; Coronary artery disease in his other; Heart disease in his father and mother; Heart failure in his other; Kidney disease in his other.  Medications: Current Outpatient Medications  Medication Sig Dispense Refill  . acetaminophen (TYLENOL) 325 MG tablet Take 2 tablets (650 mg total) by mouth every 6 (six) hours as needed for mild pain (or Fever >/= 101).    [name] aspirin 81 MG tablet Take 81 mg by mouth every evening.     . Calcium Carbonate-Vitamin D (CALCIUM 500/D) 500-125 MG-UNIT TABS Take by mouth.    . cholecalciferol (VITAMIN D) 1000 units tablet Take 1,000 Units by mouth every evening.    [name] ibuprofen (ADVIL,MOTRIN) 200 MG tablet Take 400-600 mg by mouth 3 (three) times daily as needed for headache or moderate pain.    [name] ipratropium (ATROVENT) 0.06 % nasal spray Place 2 sprays into both nostrils every 4 (four) hours as needed for rhinitis. 10 mL 6  . Multiple Vitamin (MULTIVITAMIN) tablet Take 1 tablet by mouth every evening.     . Omega-3 Fatty Acids (FISH OIL) 1200 MG CAPS Take 1,200 mg by  mouth every evening.    . rosuvastatin (CRESTOR) 40 MG tablet Take 1 tablet (40 mg total) by mouth daily. (Patient taking differently: Take 40 mg by mouth every evening. ) 90 tablet 3  . azithromycin (ZITHROMAX) 250 MG tablet Take 1 tablet (250 mg total) by mouth daily. Take first 2 tablets together, then 1 every day until finished. 6 tablet 0  . benzonatate (TESSALON) 200 MG capsule Take 1 capsule (200 mg total) by mouth 3 (three) times daily as needed for cough. 45 capsule 0  . predniSONE (DELTASONE) 10 MG tablet Take 3 tablets (30 mg total) by mouth daily with breakfast. 15 tablet 0  Current Facility-Administered Medications  Medication Dose Route Frequency Provider Last Rate Last Dose  . 0.9 %  sodium chloride infusion  500 mL Intravenous Once Gatha Mayer, MD       Allergies  Allergen Reactions  . Sulfonamide Derivatives Hives, Shortness Of Breath and Rash  . Oxycodone Hives    fever     Discussed warning signs or symptoms. Please see discharge instructions. Patient expresses understanding.

## 2018-05-13 ENCOUNTER — Ambulatory Visit (INDEPENDENT_AMBULATORY_CARE_PROVIDER_SITE_OTHER): Payer: No Typology Code available for payment source | Admitting: Sports Medicine

## 2018-05-13 VITALS — BP 109/69 | HR 90 | Ht 75.0 in | Wt 239.0 lb

## 2018-05-13 DIAGNOSIS — L821 Other seborrheic keratosis: Secondary | ICD-10-CM | POA: Diagnosis not present

## 2018-05-13 DIAGNOSIS — M25512 Pain in left shoulder: Secondary | ICD-10-CM

## 2018-05-13 DIAGNOSIS — Z23 Encounter for immunization: Secondary | ICD-10-CM

## 2018-05-13 MED ORDER — MELOXICAM 15 MG PO TABS: ORAL_TABLET | ORAL | 3 refills | Status: DC

## 2018-05-13 NOTE — Progress Notes (Signed)
Subjective:    CC: Shoulder pain, rash  HPI: Left shoulder pain: Localized anteriorly, worse with abduction and shoulder flexion.  Moderate, persistent, waking him from sleep.  Facial rash: Present over the past few months.  Does not really bother him.  I reviewed the past medical history, family history, social history, surgical history, and allergies today and no changes were needed.  Please see the problem list section below in epic for further details.  Past Medical History: Past Medical History:  Diagnosis Date  . Allergy   . Anemia   . CAD (coronary artery disease) of artery bypass graft 2009   Status post bypass grafting 2009 , status post stress echo 2012 no ischemia.  . Depression    past hx after heart surgery   . Dermatitis    Left upper extremity  . Diverticulitis 07/11/2017   Status post percutaneous drainage catheter placement for treatment  . Diverticulosis    Severe in sigmoid colon, Status post percutaneous drainage catheter placement for treatment  . Erectile dysfunction   . History of diverticular abscess 07/11/2017   Status post percutaneous drainage catheter placement for treatment, 07/30/17 drain removed  . History of kidney infection 1990  . Hx of adenomatous colonic polyps 09/02/2014  . Hyperlipidemia, mixed   . Hypotension, unspecified   . Myocardial infarction [facility])    per pt had 2 MI- 2009 both   . Numbness and tingling in left hand    S/p graft for CABG  . Sleep apnea    On 8 cm of water CPAP   Past Surgical History: Past Surgical History:  Procedure Laterality Date  . COLONOSCOPY  2016  . COLONOSCOPY  2007  . COLONOSCOPY W/ POLYPECTOMY  10/07/2017  . CORONARY ARTERY BYPASS GRAFT  2009   x4  . CYSTOSCOPY W/ RETROGRADES Bilateral 11/27/2017   Procedure: BILATERAL CYSTOSCOPY WITH RETROGRADE PYELOGRAM, BILATERAL URETERAL CATHETERIZATION;  Surgeon: Lucas Mallow, MD;  [facility]: [facility];  Service: Urology;  Laterality: Bilateral;  . FLEXIBLE  SIGMOIDOSCOPY N/A 11/27/2017   Procedure: FLEXIBLE SIGMOIDOSCOPY;  Surgeon: Ileana Roup, MD;  Location: [facility];  Service: General;  Laterality: N/A;  . IR RADIOLOGIST EVAL & MGMT  07/30/2017  . LAPAROSCOPIC SIGMOID COLECTOMY N/A 11/27/2017   Procedure: LAPAROSCOPIC SIGMOIDECTOMY;  Surgeon: Ileana Roup, MD;  Location: [facility];  Service: General;  Laterality: N/A;   Social History: Social History   Socioeconomic History  . Marital status: Married    Spouse name: Not on file  . Number of children: Not on file  . Years of education: Not on file  . Highest education level: Not on file  Occupational History  . Occupation: Full time    Employer: [employer]  Social Needs  . Financial resource strain: Not on file  . Food insecurity:    Worry: Not on file    Inability: Not on file  . Transportation needs:    Medical: Not on file    Non-medical: Not on file  Tobacco Use  . Smoking status: Former Smoker    Packs/day: 0.20    Years: 5.00    Pack years: 1.00    Types: Cigarettes, Cigars    Start date: 05/28/2000    Last attempt to quit: 05/07/2005    Years since quitting: 13.0  . Smokeless tobacco: Former Systems developer    Types: Chew    Quit date: 05/07/1997  . Tobacco comment: chewed tobacco x 20 yrs - quit 2000  Substance  and Sexual Activity  . Alcohol use: Yes    Alcohol/week: 15.0 standard drinks    Types: 10 Shots of liquor, 5 Standard drinks or equivalent per week    Comment: beer & liquor  . Drug use: No  . Sexual activity: Yes    Partners: Female  Lifestyle  . Physical activity:    Days per week: Not on file    Minutes per session: Not on file  . Stress: Not on file  Relationships  . Social connections:    Talks on phone: Not on file    Gets together: Not on file    Attends religious service: Not on file    Active member of club or organization: Not on file    Attends meetings of clubs or organizations: Not on file    Relationship status: Not on file  Other  Topics Concern  . Not on file  Social History Narrative  . Not on file   Family History: Family History  Problem Relation Age of Onset  . Heart disease Mother   . Heart disease Father   . Colon cancer Paternal Uncle   . Kidney disease Other   . Coronary artery disease Other   . Heart failure Other   . Esophageal cancer Neg Hx   . Prostate cancer Neg Hx   . Rectal cancer Neg Hx   . Stomach cancer Neg Hx   . Colon polyps Neg Hx    Allergies: Allergies  Allergen Reactions  . Sulfonamide Derivatives Hives, Shortness Of Breath and Rash  . Oxycodone Hives    fever   Medications: See med rec.  Review of Systems: No fevers, chills, night sweats, weight loss, chest pain, or shortness of breath.   Objective:    General: Well Developed, well nourished, and in no acute distress.  Neuro: Alert and oriented x3, extra-ocular muscles intact, sensation grossly intact.  HEENT: Normocephalic, atraumatic, pupils equal round reactive to light, neck supple, no masses, no lymphadenopathy, thyroid nonpalpable.  Skin: Warm and dry, no rashes.  Seborrheic keratoses over the right side of the face, multiple cutaneous acrochordons on the neck. Cardiac: Regular rate and rhythm, no murmurs rubs or gallops, no lower extremity edema.  Respiratory: Clear to auscultation bilaterally. Not using accessory muscles, speaking in full sentences. Left shoulder: Inspection reveals no abnormalities, atrophy or asymmetry. Palpation is normal with no tenderness over AC joint or bicipital groove. ROM is full in all planes. Rotator cuff strength normal throughout. No signs of impingement with negative Neer and Hawkin's tests, empty can. Speeds and Yergason's tests positive. No labral pathology noted with negative Obrien's, negative crank, negative clunk, and good stability. Normal scapular function observed. No painful arc and no drop arm sign. No apprehension sign  Procedure: Real-time Ultrasound Guided  Injection of the biceps tendon sheath Device: GE Logiq E  Verbal informed consent obtained.  Time-out conducted.  Noted no overlying erythema, induration, or other signs of local infection.  Skin prepped in a sterile fashion.  Local anesthesia: Topical Ethyl chloride.  With sterile technique and under real time ultrasound guidance: Using a 25-gauge needle and taking care to avoid intratendinous injection I guided the needle into the biceps tendon sheath and injected 1 cc Kenalog 40, 2 cc lidocaine, 2 cc bupivacaine. Completed without difficulty  Pain immediately resolved suggesting accurate placement of the medication.  Advised to call if fevers/chills, erythema, induration, drainage, or persistent bleeding.  Images permanently stored and available for review in the ultrasound  unit.  Impression: Technically successful ultrasound guided injection.  Impression and Recommendations:    Acute pain of left shoulder Clinically referrable to the biceps tendon sheath. Because the pain is severe and waking him up at night we are going to do a biceps sheath injection. Adding home rehab exercises, meloxicam. X-rays. Return to see me in a month.  Seborrheic keratosis Over the right cheek. No treatment needed. If he decides he would like these gone we can do cryotherapy on the SKs as well as the skin tags on his neck. ___________________________________________ [name]. [name], M.D., ABFM., CAQSM. Primary Care and Sports Medicine [facility]  Adjunct Professor of [employer] [facility] [employer]

## 2018-05-13 NOTE — Assessment & Plan Note (Signed)
Clinically referrable to the biceps tendon sheath. Because the pain is severe and waking him up at night we are going to do a biceps sheath injection. Adding home rehab exercises, meloxicam. X-rays. Return to see me in a month.

## 2018-05-13 NOTE — Assessment & Plan Note (Signed)
Over the right cheek. No treatment needed. If he decides he would like these gone we can do cryotherapy on the SKs as well as the skin tags on his neck.

## 2018-05-17 ENCOUNTER — Emergency Department (HOSPITAL_COMMUNITY)
Admission: EM | Admit: 2018-05-17 | Discharge: 2018-05-18 | Disposition: A | Discharge status: Home / Self Care | Payer: No Typology Code available for payment source | Attending: Emergency Medicine | Admitting: Emergency Medicine

## 2018-05-17 ENCOUNTER — Other Ambulatory Visit: Payer: Self-pay

## 2018-05-17 ENCOUNTER — Encounter (HOSPITAL_COMMUNITY): Payer: Self-pay | Admitting: Emergency Medicine

## 2018-05-17 DIAGNOSIS — R1084 Generalized abdominal pain: Secondary | ICD-10-CM | POA: Diagnosis not present

## 2018-05-17 DIAGNOSIS — D72829 Elevated white blood cell count, unspecified: Secondary | ICD-10-CM | POA: Insufficient documentation

## 2018-05-17 DIAGNOSIS — I2510 Atherosclerotic heart disease of native coronary artery without angina pectoris: Secondary | ICD-10-CM | POA: Diagnosis not present

## 2018-05-17 DIAGNOSIS — E785 Hyperlipidemia, unspecified: Secondary | ICD-10-CM | POA: Insufficient documentation

## 2018-05-17 DIAGNOSIS — I10 Essential (primary) hypertension: Secondary | ICD-10-CM | POA: Diagnosis not present

## 2018-05-17 MED ORDER — ONDANSETRON HCL 4 MG/2ML IJ SOLN
4.0000 mg | Freq: Once | INTRAMUSCULAR | Status: AC
  Administered 2018-05-18: 4 mg via INTRAVENOUS
  Filled 2018-05-17: qty 2

## 2018-05-17 MED ORDER — FENTANYL CITRATE (PF) 100 MCG/2ML IJ SOLN
100.0000 ug | Freq: Once | INTRAMUSCULAR | Status: AC
  Administered 2018-05-18: 100 ug via INTRAVENOUS
  Filled 2018-05-17: qty 2

## 2018-05-17 NOTE — ED Triage Notes (Signed)
Pt c/o generalized abd pain starting today, pt has hx of diverticulitis, denies n/v/d/fever

## 2018-05-18 ENCOUNTER — Emergency Department (HOSPITAL_COMMUNITY): Payer: No Typology Code available for payment source

## 2018-05-18 LAB — COMPREHENSIVE METABOLIC PANEL
ALK PHOS: 54 U/L (ref 38–126)
ALT: 36 U/L (ref 0–44)
AST: 25 U/L (ref 15–41)
Albumin: 3.9 g/dL (ref 3.5–5.0)
Anion gap: 7 (ref 5–15)
BUN: 21 mg/dL — AB (ref 6–20)
CALCIUM: 8.7 mg/dL — AB (ref 8.9–10.3)
CHLORIDE: 108 mmol/L (ref 98–111)
CO2: 20 mmol/L — AB (ref 22–32)
CREATININE: 0.79 mg/dL (ref 0.61–1.24)
GFR calc non Af Amer: >60 mL/min (ref >60)
Glucose, Bld: 158 mg/dL — ABNORMAL HIGH (ref 70–99)
Potassium: 3.9 mmol/L (ref 3.5–5.1)
SODIUM: 135 mmol/L (ref 135–145)
Total Bilirubin: 0.4 mg/dL (ref 0.3–1.2)
Total Protein: 7 g/dL (ref 6.5–8.1)

## 2018-05-18 LAB — CBC WITH DIFFERENTIAL/PLATELET
ABS IMMATURE GRANULOCYTES: 0.44 10*3/uL — AB (ref 0.00–0.07)
BASOS PCT: 1 %
Basophils Absolute: 0.1 10*3/uL (ref 0.0–0.1)
Eosinophils Absolute: 0 10*3/uL (ref 0.0–0.5)
Eosinophils Relative: 0 %
HCT: 46.7 % (ref 39.0–52.0)
HEMOGLOBIN: 15.5 g/dL (ref 13.0–17.0)
IMMATURE GRANULOCYTES: 2 %
LYMPHS PCT: 16 %
Lymphs Abs: 3.1 10*3/uL (ref 0.7–4.0)
MCH: 28.3 pg (ref 26.0–34.0)
MCHC: 33.2 g/dL (ref 30.0–36.0)
MCV: 85.4 fL (ref 80.0–100.0)
MONO ABS: 1.9 10*3/uL — AB (ref 0.1–1.0)
MONOS PCT: 9 %
NEUTROS ABS: 14.4 10*3/uL — AB (ref 1.7–7.7)
NEUTROS PCT: 72 %
PLATELETS: 355 10*3/uL (ref 150–400)
RBC: 5.47 MIL/uL (ref 4.22–5.81)
RDW: 12.8 % (ref 11.5–15.5)
WBC: 19.9 10*3/uL — ABNORMAL HIGH (ref 4.0–10.5)
nRBC: 0 % (ref 0.0–0.2)

## 2018-05-18 LAB — URINALYSIS, ROUTINE W REFLEX MICROSCOPIC
BILIRUBIN URINE: NEGATIVE
GLUCOSE, UA: NEGATIVE mg/dL
HGB URINE DIPSTICK: NEGATIVE
Ketones, ur: NEGATIVE mg/dL
Leukocytes, UA: NEGATIVE
NITRITE: NEGATIVE
PROTEIN: NEGATIVE mg/dL
Specific Gravity, Urine: 1.026 (ref 1.005–1.030)
pH: 6 (ref 5.0–8.0)

## 2018-05-18 LAB — TROPONIN I: Troponin I: <0.03 ng/mL (ref <0.03)

## 2018-05-18 LAB — I-STAT TROPONIN, ED: Troponin i, poc: 0 ng/mL (ref 0.00–0.08)

## 2018-05-18 LAB — LIPASE, BLOOD: Lipase: 31 U/L (ref 11–51)

## 2018-05-18 MED ORDER — IOPAMIDOL (ISOVUE-300) INJECTION 61%
100.0000 mL | Freq: Once | INTRAVENOUS | Status: AC | PRN
  Administered 2018-05-18: 100 mL via INTRAVENOUS

## 2018-05-18 MED ORDER — SODIUM CHLORIDE 0.9 % IV BOLUS (SEPSIS)
1000.0000 mL | Freq: Once | INTRAVENOUS | Status: AC
  Administered 2018-05-18: 1000 mL via INTRAVENOUS

## 2018-05-18 NOTE — Discharge Instructions (Addendum)
  SEEK IMMEDIATE MEDICAL ATTENTION IF: The pain does not go away or becomes severe, particularly over the next 8-12 hours.  A temperature above 100.4F develops.  Repeated vomiting occurs (multiple episodes).  The pain becomes localized to portions of the abdomen.  Blood is being passed in stools or vomit (bright red or black tarry stools).  Return also if you develop chest pain, difficulty breathing, dizziness or fainting, or become confused, poorly responsive, or inconsolable.  

## 2018-05-18 NOTE — ED Provider Notes (Signed)
[facility] EMERGENCY DEPARTMENT Provider Note   CSN: [id number] Arrival date & time: 05/17/18  2243   Patient gives permission to perform history and physical in front of family/friend   History   Chief Complaint Chief Complaint  Patient presents with  . Abdominal Pain    HPI [name] is a 53 y.o. male.  The history is provided by the patient and the spouse.  Abdominal Pain  Pain location:  Generalized Pain quality: aching   Pain severity:  Moderate Onset quality:  Gradual Timing:  Constant Progression:  Worsening Chronicity:  New Relieved by:  Nothing Worsened by:  Movement Associated symptoms: chest pain, cough and shortness of breath   Associated symptoms: no dysuria, no fever and no vomiting    Patient with history of CAD, previous colectomy presents with abdominal pain.  It has been ongoing over the past 12 hours.  No fever or vomiting.  He did report some chest pressure and shortness of breath that is improved. Reports this feels similar to prior history of diverticulitis Past Medical History:  Diagnosis Date  . Allergy   . Anemia   . CAD (coronary artery disease) of artery bypass graft 2009   Status post bypass grafting 2009 , status post stress echo 2012 no ischemia.  . Depression    past hx after heart surgery   . Dermatitis    Left upper extremity  . Diverticulitis 07/11/2017   Status post percutaneous drainage catheter placement for treatment  . Diverticulosis    Severe in sigmoid colon, Status post percutaneous drainage catheter placement for treatment  . Erectile dysfunction   . History of diverticular abscess 07/11/2017   Status post percutaneous drainage catheter placement for treatment, 07/30/17 drain removed  . History of kidney infection 1990  . Hx of adenomatous colonic polyps 09/02/2014  . Hyperlipidemia, mixed   . Hypotension, unspecified   . Myocardial infarction [facility])    per pt had 2 MI- 2009 both   . Numbness and tingling in  left hand    S/p graft for CABG  . Sleep apnea    On 8 cm of water CPAP    Patient Active Problem List   Diagnosis Date Noted  . Acute pain of left shoulder 05/13/2018  . Seborrheic keratosis 05/13/2018  . S/P laparoscopic-assisted sigmoidectomy 11/27/2017  . Diverticulitis of large intestine with abscess without bleeding 07/11/2017  . Chronic cough 07/07/2015  . Prostate nodule 05/19/2015  . Hx of adenomatous colonic polyps 09/02/2014  . Annual physical exam 05/20/2014  . Allergic rhinitis 11/17/2013  . Erectile dysfunction   . Sleep apnea   . CAD (coronary artery disease) of artery bypass graft   . Essential hypertension, benign 07/05/2009  . Hyperlipidemia 01/18/2009    Past Surgical History:  Procedure Laterality Date  . COLONOSCOPY  2016  . COLONOSCOPY  2007  . COLONOSCOPY W/ POLYPECTOMY  10/07/2017  . CORONARY ARTERY BYPASS GRAFT  2009   x4  . CYSTOSCOPY W/ RETROGRADES Bilateral 11/27/2017   Procedure: BILATERAL CYSTOSCOPY WITH RETROGRADE PYELOGRAM, BILATERAL URETERAL CATHETERIZATION;  Surgeon: Lucas Mallow, MD;  Location: [facility];  Service: Urology;  Laterality: Bilateral;  . FLEXIBLE SIGMOIDOSCOPY N/A 11/27/2017   Procedure: FLEXIBLE SIGMOIDOSCOPY;  Surgeon: Ileana Roup, MD;  Location: [facility];  Service: General;  Laterality: N/A;  . IR RADIOLOGIST EVAL & MGMT  07/30/2017  . LAPAROSCOPIC SIGMOID COLECTOMY N/A 11/27/2017   Procedure: LAPAROSCOPIC SIGMOIDECTOMY;  Surgeon: Ileana Roup, MD;  Location: [facility];  Service: General;  Laterality: N/A;        Home Medications    Prior to Admission medications   Medication Sig Start Date End Date Taking? Authorizing Provider  aspirin 81 MG tablet Take 81 mg by mouth every evening.     [provider]  azithromycin (ZITHROMAX) 250 MG tablet Take 1 tablet (250 mg total) by mouth daily. Take first 2 tablets together, then 1 every day until finished. 03/11/18   Gregor Hams, MD  benzonatate  (TESSALON) 200 MG capsule Take 1 capsule (200 mg total) by mouth 3 (three) times daily as needed for cough. 03/11/18   Gregor Hams, MD  Calcium Carbonate-Vitamin D (CALCIUM 500/D) 500-125 MG-UNIT TABS Take by mouth.    [provider]  cholecalciferol (VITAMIN D) 1000 units tablet Take 1,000 Units by mouth every evening.    [provider]  ibuprofen (ADVIL,MOTRIN) 200 MG tablet Take 400-600 mg by mouth 3 (three) times daily as needed for headache or moderate pain.    [provider]  ipratropium (ATROVENT) 0.06 % nasal spray Place 2 sprays into both nostrils every 4 (four) hours as needed for rhinitis. 04/23/16   Gregor Hams, MD  meloxicam (MOBIC) 15 MG tablet One tab PO qAM with breakfast for 2 weeks, then daily prn pain. 05/13/18   Silverio Decamp, MD  Multiple Vitamin (MULTIVITAMIN) tablet Take 1 tablet by mouth every evening.     [provider]  Omega-3 Fatty Acids (FISH OIL) 1200 MG CAPS Take 1,200 mg by mouth every evening.    [provider]  predniSONE (DELTASONE) 10 MG tablet Take 3 tablets (30 mg total) by mouth daily with breakfast. 03/11/18   Gregor Hams, MD  rosuvastatin (CRESTOR) 40 MG tablet Take 1 tablet (40 mg total) by mouth daily. Patient taking differently: Take 40 mg by mouth every evening.  05/06/17   Arnoldo Lenis, MD    Family History Family History  Problem Relation Age of Onset  . Heart disease Mother   . Heart disease Father   . Colon cancer Paternal Uncle   . Kidney disease Other   . Coronary artery disease Other   . Heart failure Other   . Esophageal cancer Neg Hx   . Prostate cancer Neg Hx   . Rectal cancer Neg Hx   . Stomach cancer Neg Hx   . Colon polyps Neg Hx     Social History Social History   Tobacco Use  . Smoking status: Former Smoker    Packs/day: 0.20    Years: 5.00    Pack years: 1.00    Types: Cigarettes, Cigars    Start date: 05/28/2000    Last attempt to quit: 05/07/2005     Years since quitting: 13.0  . Smokeless tobacco: Former Systems developer    Types: Chew    Quit date: 05/07/1997  . Tobacco comment: chewed tobacco x 20 yrs - quit 2000  Substance Use Topics  . Alcohol use: Yes    Alcohol/week: 15.0 standard drinks    Types: 10 Shots of liquor, 5 Standard drinks or equivalent per week    Comment: beer & liquor  . Drug use: No     Allergies   Sulfonamide derivatives and Oxycodone   Review of Systems Review of Systems  Constitutional: Negative for fever.  Respiratory: Positive for cough and shortness of breath.   Cardiovascular: Positive for chest pain.  Gastrointestinal: Positive for abdominal  pain. Negative for vomiting.  Genitourinary: Negative for dysuria.  Musculoskeletal: Negative for back pain.  Neurological: Negative for weakness.  All other systems reviewed and are negative.    Physical Exam Updated Vital Signs BP 123/80   Pulse 65   Temp (!) 97.5 F (36.4 C) (Oral)   Resp 19   Ht 1.905 m (6\' 3" )   Wt 108.9 kg   SpO2 98%   BMI 30.00 kg/m   Physical Exam CONSTITUTIONAL: Well developed/well nourished uncomfortable appearing HEAD: Normocephalic/atraumatic EYES: EOMI/PERRL ENMT: Mucous membranes moist NECK: supple no meningeal signs SPINE/BACK:entire spine nontender CV: S1/S2 noted, no murmurs/rubs/gallops noted LUNGS: Lungs are clear to auscultation bilaterally, no apparent distress ABDOMEN: soft, moderate generalized abdominal tenderness., no rebound or guarding, bowel sounds noted throughout abdomen, well-healed scars noted GU:no cva tenderness NEURO: Pt is awake/alert/appropriate, moves all extremitiesx4.  No facial droop.   EXTREMITIES: pulses normal/equal, full ROM SKIN: warm, color normal PSYCH: no abnormalities of mood noted, alert and oriented to situation   ED Treatments / Results  Labs (all labs ordered are listed, but only abnormal results are displayed) Labs Reviewed  CBC WITH DIFFERENTIAL/PLATELET - Abnormal;  Notable for the following components:      Result Value   WBC 19.9 (*)    Neutro Abs 14.4 (*)    Monocytes Absolute 1.9 (*)    Abs Immature Granulocytes 0.44 (*)    All other components within normal limits  COMPREHENSIVE METABOLIC PANEL - Abnormal; Notable for the following components:   CO2 20 (*)    Glucose, Bld 158 (*)    BUN 21 (*)    Calcium 8.7 (*)    All other components within normal limits  LIPASE, BLOOD  TROPONIN I  URINALYSIS, ROUTINE W REFLEX MICROSCOPIC  I-STAT TROPONIN, ED    EKG EKG Interpretation  Date/Time:  Sunday May 18 2018 00:02:07 EST Ventricular Rate:  65 PR Interval:    QRS Duration: 95 QT Interval:  382 QTC Calculation: 398 R Axis:   91 Text Interpretation:  Sinus rhythm Ventricular premature complex Borderline right axis deviation Low voltage, precordial leads Probable anteroseptal infarct, old Confirmed by Ripley Fraise [phone]) on 05/18/2018 12:06:49 AM   Radiology Ct Abdomen Pelvis W Contrast  Result Date: 05/18/2018 CLINICAL DATA:  Acute generalized abdominal pain starting today. EXAM: CT ABDOMEN AND PELVIS WITH CONTRAST TECHNIQUE: Multidetector CT imaging of the abdomen and pelvis was performed using the standard protocol following bolus administration of intravenous contrast. CONTRAST:  123mL ISOVUE-300 IOPAMIDOL (ISOVUE-300) INJECTION 61% COMPARISON:  07/30/2017 FINDINGS: Lower chest: Atelectasis in the lung bases. Coronary artery calcifications. Hepatobiliary: Diffuse fatty infiltration of the liver. No focal liver abnormality is seen. No gallstones, gallbladder wall thickening, or biliary dilatation. Pancreas: Unremarkable. No pancreatic ductal dilatation or surrounding inflammatory changes. Spleen: Normal in size without focal abnormality. Adrenals/Urinary Tract: Adrenal glands are unremarkable. Kidneys are normal, without renal calculi, focal lesion, or hydronephrosis. Bladder is unremarkable. Stomach/Bowel: Surgical anastomosis at the low  sigmoid colon. Stomach, small bowel, and colon are not abnormally distended. No wall thickening or inflammatory changes appreciated. Stool fills the colon. Appendix is normal. Vascular/Lymphatic: No significant vascular findings are present. No enlarged abdominal or pelvic lymph nodes. Reproductive: Prostate is unremarkable. Other: No abdominal wall hernia or abnormality. No abdominopelvic ascites. Musculoskeletal: No acute or significant osseous findings. Postoperative sternotomy. IMPRESSION: No acute process demonstrated in the abdomen or pelvis. No evidence of bowel obstruction or inflammation. Diffuse fatty infiltration of the liver. Electronically Signed  By: Lucienne Capers M.D.   On: 05/18/2018 03:16    Procedures Procedures    Medications Ordered in ED Medications  fentaNYL (SUBLIMAZE) injection 100 mcg (100 mcg Intravenous Given 05/18/18 0020)  ondansetron (ZOFRAN) injection 4 mg (4 mg Intravenous Given 05/18/18 0020)  sodium chloride 0.9 % bolus 1,000 mL (0 mLs Intravenous Stopped 05/18/18 0245)  iopamidol (ISOVUE-300) 61 % injection 100 mL (100 mLs Intravenous Contrast Given 05/18/18 0255)     Initial Impression / Assessment and Plan / ED Course  I have reviewed the triage vital signs and the nursing notes.  Pertinent labs & imaging results that were available during my care of the patient were reviewed by me and considered in my medical decision making (see chart for details).     1:18 AM Patient with history of diverticulitis previously required colectomy now presents with generalized abdominal pain.  He has diffuse moderate tenderness.  Will obtain CT imaging 2:22 AM Patient resting comfortably awaiting CT imaging. 3:38 AM Patient reports improvement.  CT imaging does not reveal any acute abdominal emergency.  He has no focal abdominal tenderness on exam.  He has elevated white count of unclear etiology.  There has been no fever, he has had only minimal cough.  No other  significant infectious symptoms.  Will check urinalysis and reassess 4:07 AM Urinalysis negative.  Patient is pain-free.  Advised follow-up in a week to have recheck of his CBC to ensure leukocytosis improving.  Unclear cause of leukocytosis this time, but there are no signs of any infections. Discussed strict ER return precautions. Final Clinical Impressions(s) / ED Diagnoses   Final diagnoses:  Generalized abdominal pain  Leukocytosis, unspecified type    ED Discharge Orders    None       Ripley Fraise, MD 05/18/18 [phone]

## 2018-05-23 ENCOUNTER — Encounter: Payer: Self-pay | Admitting: Sports Medicine

## 2018-05-23 ENCOUNTER — Ambulatory Visit (INDEPENDENT_AMBULATORY_CARE_PROVIDER_SITE_OTHER): Payer: No Typology Code available for payment source | Admitting: Sports Medicine

## 2018-05-23 DIAGNOSIS — E785 Hyperlipidemia, unspecified: Secondary | ICD-10-CM | POA: Diagnosis not present

## 2018-05-23 DIAGNOSIS — D72829 Elevated white blood cell count, unspecified: Secondary | ICD-10-CM

## 2018-05-23 DIAGNOSIS — Z0000 Encounter for general adult medical examination without abnormal findings: Secondary | ICD-10-CM | POA: Diagnosis not present

## 2018-05-23 DIAGNOSIS — M25512 Pain in left shoulder: Secondary | ICD-10-CM

## 2018-05-23 NOTE — Progress Notes (Signed)
Subjective:    CC: Follow-up from the ED  HPI: [name] is a pleasant 53 year old male, he has a history of diverticulitis with perforation and abscess requiring percutaneous drainage.  He has done well since his hospitalization for the above.  More recently after a bicep tendon sheath injection on the left side for shoulder pain he developed a little bit of epigastric pain, and went to the emergency department.  A CT of the abdomen and pelvis with oral and IV contrast was unremarkable, he did have leukocytosis, his abdominal pain has resolved and he is back for further evaluation definitive treatment of his leukocytosis.  Left shoulder pain: Resolved after injection.  I reviewed the past medical history, family history, social history, surgical history, and allergies today and no changes were needed.  Please see the problem list section below in epic for further details.  Past Medical History: Past Medical History:  Diagnosis Date  . Allergy   . Anemia   . CAD (coronary artery disease) of artery bypass graft 2009   Status post bypass grafting 2009 , status post stress echo 2012 no ischemia.  . Depression    past hx after heart surgery   . Dermatitis    Left upper extremity  . Diverticulitis 07/11/2017   Status post percutaneous drainage catheter placement for treatment  . Diverticulosis    Severe in sigmoid colon, Status post percutaneous drainage catheter placement for treatment  . Erectile dysfunction   . History of diverticular abscess 07/11/2017   Status post percutaneous drainage catheter placement for treatment, 07/30/17 drain removed  . History of kidney infection 1990  . Hx of adenomatous colonic polyps 09/02/2014  . Hyperlipidemia, mixed   . Hypotension, unspecified   . Myocardial infarction [facility])    per pt had 2 MI- 2009 both   . Numbness and tingling in left hand    S/p graft for CABG  . Sleep apnea    On 8 cm of water CPAP   Past Surgical History: Past Surgical  History:  Procedure Laterality Date  . COLONOSCOPY  2016  . COLONOSCOPY  2007  . COLONOSCOPY W/ POLYPECTOMY  10/07/2017  . CORONARY ARTERY BYPASS GRAFT  2009   x4  . CYSTOSCOPY W/ RETROGRADES Bilateral 11/27/2017   Procedure: BILATERAL CYSTOSCOPY WITH RETROGRADE PYELOGRAM, BILATERAL URETERAL CATHETERIZATION;  Surgeon: Lucas Mallow, MD;  Location: [facility];  Service: Urology;  Laterality: Bilateral;  . FLEXIBLE SIGMOIDOSCOPY N/A 11/27/2017   Procedure: FLEXIBLE SIGMOIDOSCOPY;  Surgeon: Ileana Roup, MD;  Location: [facility];  Service: General;  Laterality: N/A;  . IR RADIOLOGIST EVAL & MGMT  07/30/2017  . LAPAROSCOPIC SIGMOID COLECTOMY N/A 11/27/2017   Procedure: LAPAROSCOPIC SIGMOIDECTOMY;  Surgeon: Ileana Roup, MD;  Location: [facility];  Service: General;  Laterality: N/A;   Social History: Social History   Socioeconomic History  . Marital status: Married    Spouse name: Not on file  . Number of children: Not on file  . Years of education: Not on file  . Highest education level: Not on file  Occupational History  . Occupation: Full time    Employer: [employer]  Social Needs  . Financial resource strain: Not on file  . Food insecurity:    Worry: Not on file    Inability: Not on file  . Transportation needs:    Medical: Not on file    Non-medical: Not on file  Tobacco Use  . Smoking status: Former Smoker  Packs/day: 0.20    Years: 5.00    Pack years: 1.00    Types: Cigarettes, Cigars    Start date: 05/28/2000    Last attempt to quit: 05/07/2005    Years since quitting: 13.0  . Smokeless tobacco: Former Systems developer    Types: Chew    Quit date: 05/07/1997  . Tobacco comment: chewed tobacco x 20 yrs - quit 2000  Substance and Sexual Activity  . Alcohol use: Yes    Alcohol/week: 15.0 standard drinks    Types: 10 Shots of liquor, 5 Standard drinks or equivalent per week    Comment: beer & liquor  . Drug use: No  . Sexual activity: Yes    Partners: Female    Lifestyle  . Physical activity:    Days per week: Not on file    Minutes per session: Not on file  . Stress: Not on file  Relationships  . Social connections:    Talks on phone: Not on file    Gets together: Not on file    Attends religious service: Not on file    Active member of club or organization: Not on file    Attends meetings of clubs or organizations: Not on file    Relationship status: Not on file  Other Topics Concern  . Not on file  Social History Narrative  . Not on file   Family History: Family History  Problem Relation Age of Onset  . Heart disease Mother   . Heart disease Father   . Colon cancer Paternal Uncle   . Kidney disease Other   . Coronary artery disease Other   . Heart failure Other   . Esophageal cancer Neg Hx   . Prostate cancer Neg Hx   . Rectal cancer Neg Hx   . Stomach cancer Neg Hx   . Colon polyps Neg Hx    Allergies: Allergies  Allergen Reactions  . Sulfonamide Derivatives Hives, Shortness Of Breath and Rash  . Oxycodone Hives    fever   Medications: See med rec.  Review of Systems: No fevers, chills, night sweats, weight loss, chest pain, or shortness of breath.   Objective:    General: Well Developed, well nourished, and in no acute distress.  Neuro: Alert and oriented x3, extra-ocular muscles intact, sensation grossly intact.  HEENT: Normocephalic, atraumatic, pupils equal round reactive to light, neck supple, no masses, no lymphadenopathy, thyroid nonpalpable.  Skin: Warm and dry, no rashes. Cardiac: Regular rate and rhythm, no murmurs rubs or gallops, no lower extremity edema.  Respiratory: Clear to auscultation bilaterally. Not using accessory muscles, speaking in full sentences. Left shoulder: Inspection reveals no abnormalities, atrophy or asymmetry. Palpation is normal with no tenderness over AC joint or bicipital groove. ROM is full in all planes. Rotator cuff strength normal throughout. No signs of impingement with  negative Neer and Hawkin's tests, empty can. Speeds and Yergason's tests normal. No labral pathology noted with negative Obrien's, negative crank, negative clunk, and good stability. Normal scapular function observed. No painful arc and no drop arm sign. No apprehension sign Abdomen: Soft, nontender, nondistended, normal bowel sounds, no palpable masses, no guarding, rigidity, or rebound tenderness.  Impression and Recommendations:    Acute pain of left shoulder Clinically resembled biceps tendinosis. Injection into the biceps sheath has resolved all pain.   Leukocytosis Jakevion developed some acute abdominal pain, epigastric 2 to 3 days after the steroid injection, likely gastritis. He went to the ED, he did have an  elevated white count, likely due to the steroid injection, he also had taken some ibuprofen that day. All of his abdominal pain has resolved, his CT of the abdomen and pelvis was unremarkable. Rechecking white count today. ___________________________________________ [name]. [name], M.D., ABFM., CAQSM. Primary Care and Sports Medicine [facility]  Adjunct Professor of [place] [employer]

## 2018-05-23 NOTE — Assessment & Plan Note (Signed)
[name] developed some acute abdominal pain, epigastric 2 to 3 days after the steroid injection, likely gastritis. He went to the ED, he did have an elevated white count, likely due to the steroid injection, he also had taken some ibuprofen that day. All of his abdominal pain has resolved, his CT of the abdomen and pelvis was unremarkable. Rechecking white count today.

## 2018-05-23 NOTE — Assessment & Plan Note (Signed)
Clinically resembled biceps tendinosis. Injection into the biceps sheath has resolved all pain.

## 2018-05-26 LAB — CBC
HCT: 47.7 % (ref 38.5–50.0)
Hemoglobin: 16.7 g/dL (ref 13.2–17.1)
MCH: 29.6 pg (ref 27.0–33.0)
MCHC: 35 g/dL (ref 32.0–36.0)
MCV: 84.6 fL (ref 80.0–100.0)
MPV: 9.7 fL (ref 7.5–12.5)
Platelets: 340 10*3/uL (ref 140–400)
RBC: 5.64 Million/uL (ref 4.20–5.80)
RDW: 12.9 % (ref 11.0–15.0)
WBC: 13.7 Thousand/uL — ABNORMAL HIGH (ref 3.8–10.8)

## 2018-05-26 LAB — COMPREHENSIVE METABOLIC PANEL
ALT: 34 U/L (ref 9–46)
AST: 25 U/L (ref 10–35)
BUN: 14 mg/dL (ref 7–25)
CO2: 23 mmol/L (ref 20–32)
Creat: 0.93 mg/dL (ref 0.70–1.33)
Glucose, Bld: 96 mg/dL (ref 65–99)

## 2018-05-26 LAB — HEMOGLOBIN A1C
Hgb A1c MFr Bld: 5.9 % of total Hgb — ABNORMAL HIGH (ref <5.7)
Mean Plasma Glucose: 123 (calc)
eAG (mmol/L): 6.8 (calc)

## 2018-05-26 LAB — TSH: TSH: 3.69 m[IU]/L (ref 0.40–4.50)

## 2018-05-26 LAB — COMPREHENSIVE METABOLIC PANEL WITH GFR
AG Ratio: 1.4 (calc) (ref 1.0–2.5)
Albumin: 4.3 g/dL (ref 3.6–5.1)
Alkaline phosphatase (APISO): 67 U/L (ref 40–115)
Calcium: 9.7 mg/dL (ref 8.6–10.3)
Chloride: 104 mmol/L (ref 98–110)
Globulin: 3 g/dL (ref 1.9–3.7)
Potassium: 4.2 mmol/L (ref 3.5–5.3)
Sodium: 137 mmol/L (ref 135–146)
Total Bilirubin: 0.7 mg/dL (ref 0.2–1.2)
Total Protein: 7.3 g/dL (ref 6.1–8.1)

## 2018-05-26 LAB — LIPID PANEL W/REFLEX DIRECT LDL
Cholesterol: 139 mg/dL (ref <200)
HDL: 38 mg/dL — ABNORMAL LOW (ref >40)
LDL Cholesterol (Calc): 81 mg/dL
Non-HDL Cholesterol (Calc): 101 mg/dL (calc) (ref <130)
Total CHOL/HDL Ratio: 3.7 (calc) (ref <5.0)
Triglycerides: 107 mg/dL (ref <150)

## 2018-05-26 LAB — PSA, TOTAL AND FREE
PSA, % Free: 33 % (calc) (ref >25)
PSA, Free: 0.1 ng/mL
PSA, Total: 0.3 ng/mL (ref <=4.0)

## 2018-06-04 ENCOUNTER — Other Ambulatory Visit: Payer: Self-pay | Admitting: Cardiology

## 2018-06-05 ENCOUNTER — Other Ambulatory Visit: Payer: Self-pay | Admitting: Sports Medicine

## 2018-06-05 DIAGNOSIS — J0110 Acute frontal sinusitis, unspecified: Secondary | ICD-10-CM

## 2018-06-10 ENCOUNTER — Ambulatory Visit: Payer: No Typology Code available for payment source | Admitting: Sports Medicine

## 2018-06-23 ENCOUNTER — Ambulatory Visit: Payer: No Typology Code available for payment source | Admitting: Sports Medicine

## 2018-06-27 ENCOUNTER — Ambulatory Visit (INDEPENDENT_AMBULATORY_CARE_PROVIDER_SITE_OTHER): Payer: No Typology Code available for payment source

## 2018-06-27 ENCOUNTER — Ambulatory Visit: Payer: No Typology Code available for payment source | Admitting: Sports Medicine

## 2018-06-27 DIAGNOSIS — R053 Chronic cough: Secondary | ICD-10-CM

## 2018-06-27 DIAGNOSIS — R05 Cough: Secondary | ICD-10-CM

## 2018-06-27 DIAGNOSIS — L821 Other seborrheic keratosis: Secondary | ICD-10-CM | POA: Diagnosis not present

## 2018-06-27 MED ORDER — AZITHROMYCIN 250 MG PO TABS
ORAL_TABLET | ORAL | 0 refills | Status: DC
Start: 2018-06-27 — End: 2018-07-29

## 2018-06-27 NOTE — Assessment & Plan Note (Signed)
Cryotherapy of over 20 seborrheic keratoses and skin tags on his neck, back, chest, axillae. Return in 1 month, we will do a retreatment if needed.

## 2018-06-27 NOTE — Progress Notes (Signed)
Subjective:    CC: Skin tags, feeling sick  HPI: For the past several days this 53 year old male has had increasing cough, fullness in his neck.  Mild malaise, no fevers, chills, muscle aches or body aches.  Cough is minimally productive.  Symptoms are moderate, persistent.  No chest pain, no shortness of breath, no skin rash, no GI symptoms.  I reviewed the past medical history, family history, social history, surgical history, and allergies today and no changes were needed.  Please see the problem list section below in epic for further details.  Past Medical History: Past Medical History:  Diagnosis Date  . Allergy   . Anemia   . CAD (coronary artery disease) of artery bypass graft 2009   Status post bypass grafting 2009 , status post stress echo 2012 no ischemia.  . Depression    past hx after heart surgery   . Dermatitis    Left upper extremity  . Diverticulitis 07/11/2017   Status post percutaneous drainage catheter placement for treatment  . Diverticulosis    Severe in sigmoid colon, Status post percutaneous drainage catheter placement for treatment  . Erectile dysfunction   . History of diverticular abscess 07/11/2017   Status post percutaneous drainage catheter placement for treatment, 07/30/17 drain removed  . History of kidney infection 1990  . Hx of adenomatous colonic polyps 09/02/2014  . Hyperlipidemia, mixed   . Hypotension, unspecified   . Myocardial infarction [facility])    per pt had 2 MI- 2009 both   . Numbness and tingling in left hand    S/p graft for CABG  . Sleep apnea    On 8 cm of water CPAP   Past Surgical History: Past Surgical History:  Procedure Laterality Date  . COLONOSCOPY  2016  . COLONOSCOPY  2007  . COLONOSCOPY W/ POLYPECTOMY  10/07/2017  . CORONARY ARTERY BYPASS GRAFT  2009   x4  . CYSTOSCOPY W/ RETROGRADES Bilateral 11/27/2017   Procedure: BILATERAL CYSTOSCOPY WITH RETROGRADE PYELOGRAM, BILATERAL URETERAL CATHETERIZATION;  Surgeon: Lucas Mallow, MD;  [facility]: [facility];  Service: Urology;  Laterality: Bilateral;  . FLEXIBLE SIGMOIDOSCOPY N/A 11/27/2017   Procedure: FLEXIBLE SIGMOIDOSCOPY;  Surgeon: Ileana Roup, MD;  [facility]: [facility];  Service: General;  Laterality: N/A;  . IR RADIOLOGIST EVAL & MGMT  07/30/2017  . LAPAROSCOPIC SIGMOID COLECTOMY N/A 11/27/2017   Procedure: LAPAROSCOPIC SIGMOIDECTOMY;  Surgeon: Ileana Roup, MD;  [facility];  Service: General;  Laterality: N/A;   Social History: Social History   Socioeconomic History  . Marital status: Married    Spouse name: Not on file  . Number of children: Not on file  . Years of education: Not on file  . Highest education level: Not on file  Occupational History  . Occupation: Full time    Employer: [employer]  Social Needs  . Financial resource strain: Not on file  . Food insecurity:    Worry: Not on file    Inability: Not on file  . Transportation needs:    Medical: Not on file    Non-medical: Not on file  Tobacco Use  . Smoking status: Former Smoker    Packs/day: 0.20    Years: 5.00    Pack years: 1.00    Types: Cigarettes, Cigars    Start date: 05/28/2000    Last attempt to quit: 05/07/2005    Years since quitting: 13.1  . Smokeless tobacco: Former Systems developer    Types: Loss adjuster, chartered  Quit date: 05/07/1997  . Tobacco comment: chewed tobacco x 20 yrs - quit 2000  Substance and Sexual Activity  . Alcohol use: Yes    Alcohol/week: 15.0 standard drinks    Types: 10 Shots of liquor, 5 Standard drinks or equivalent per week    Comment: beer & liquor  . Drug use: No  . Sexual activity: Yes    Partners: Female  Lifestyle  . Physical activity:    Days per week: Not on file    Minutes per session: Not on file  . Stress: Not on file  Relationships  . Social connections:    Talks on phone: Not on file    Gets together: Not on file    Attends religious service: Not on file    Active member of club or organization: Not on file    Attends  meetings of clubs or organizations: Not on file    Relationship status: Not on file  Other Topics Concern  . Not on file  Social History Narrative  . Not on file   Family History: Family History  Problem Relation Age of Onset  . Heart disease Mother   . Heart disease Father   . Colon cancer Paternal Uncle   . Kidney disease Other   . Coronary artery disease Other   . Heart failure Other   . Esophageal cancer Neg Hx   . Prostate cancer Neg Hx   . Rectal cancer Neg Hx   . Stomach cancer Neg Hx   . Colon polyps Neg Hx    Allergies: Allergies  Allergen Reactions  . Sulfonamide Derivatives Hives, Shortness Of Breath and Rash  . Oxycodone Hives    fever   Medications: See med rec.  Review of Systems: No fevers, chills, night sweats, weight loss, chest pain, or shortness of breath.   Objective:    General: Well Developed, well nourished, and in no acute distress.  Neuro: Alert and oriented x3, extra-ocular muscles intact, sensation grossly intact.  HEENT: Normocephalic, atraumatic, pupils equal round reactive to light, neck supple, no masses, no lymphadenopathy, thyroid nonpalpable.  Skin: Warm and dry, no rashes. Cardiac: Regular rate and rhythm, no murmurs rubs or gallops, no lower extremity edema.  Respiratory: Coarse sounds in the right lower lobe.. Not using accessory muscles, speaking in full sentences.  Procedure:  Cryodestruction of over 20 seborrheic keratoses and skin tags on the neck, chest, face, back, axillae. Consent obtained and verified. Time-out conducted. Noted no overlying erythema, induration, or other signs of local infection. Completed without difficulty using Cryo-Gun. Advised to call if fevers/chills, erythema, induration, drainage, or persistent bleeding.  Impression and Recommendations:    Seborrheic keratosis Cryotherapy of over 20 seborrheic keratoses and skin tags on his neck, back, chest, axillae. Return in 1 month, we will do a retreatment  if needed.  Chronic cough Increasing cough now, coarse sounds in the right lower lobe. Chest x-ray, azithromycin. ___________________________________________ [name]. [name], M.D., ABFM., CAQSM. Primary Care and Sports Medicine [facility]  Adjunct Professor of [employer] [facility] [employer]

## 2018-06-27 NOTE — Assessment & Plan Note (Signed)
Increasing cough now, coarse sounds in the right lower lobe. Chest x-ray, azithromycin.

## 2018-07-22 ENCOUNTER — Encounter (INDEPENDENT_AMBULATORY_CARE_PROVIDER_SITE_OTHER): Payer: No Typology Code available for payment source | Admitting: Sports Medicine

## 2018-07-22 DIAGNOSIS — G4730 Sleep apnea, unspecified: Secondary | ICD-10-CM | POA: Diagnosis not present

## 2018-07-23 NOTE — Telephone Encounter (Signed)
No problem.  Lets get him scheduled then.  Didn't know we needed the F2F.

## 2018-07-23 NOTE — Assessment & Plan Note (Signed)
History of obstructive sleep apnea, machine is no longer working, needs a referral/order for a new CPAP machine.

## 2018-07-23 NOTE — Telephone Encounter (Signed)
History of obstructive sleep apnea, machine is no longer working, needs a referral/order for a new CPAP machine.  I spent approximately 5 minutes on this encounter.

## 2018-07-23 NOTE — Telephone Encounter (Signed)
Received the following update:  Got an order for this guy today. We are missing sleep study and F2F prior to sleep study. I have looked in [facility] and still don't see what we need. Can you help, please!   Thank you!  [place]

## 2018-07-24 NOTE — Telephone Encounter (Signed)
Left VM for Pt to be scheduled.

## 2018-07-29 ENCOUNTER — Ambulatory Visit (INDEPENDENT_AMBULATORY_CARE_PROVIDER_SITE_OTHER): Payer: No Typology Code available for payment source | Admitting: Sports Medicine

## 2018-07-29 ENCOUNTER — Other Ambulatory Visit: Payer: Self-pay

## 2018-07-29 ENCOUNTER — Encounter: Payer: Self-pay | Admitting: Sports Medicine

## 2018-07-29 ENCOUNTER — Ambulatory Visit: Payer: No Typology Code available for payment source | Admitting: Sports Medicine

## 2018-07-29 DIAGNOSIS — G4730 Sleep apnea, unspecified: Secondary | ICD-10-CM

## 2018-07-29 NOTE — Assessment & Plan Note (Signed)
Face-to-face visit performed today. Unfortunately insurance required this. Excessive daytime sleepiness, neck circumference greater than 17, overweight. Repeat order/referral placed for home sleep study and CPAP with [facility]. Historically he was on 8 cm H2O. Stop bang score = 7

## 2018-07-29 NOTE — Progress Notes (Signed)
Subjective:    CC: Face-to-face visit for sleep apnea  HPI: This visit is completely unnecessary but unfortunately insurance requires it, [name] has excessive daytime sleepiness, he has a high stop bang score, he has a previous diagnosis of obstructive sleep apnea.  I reviewed the past medical history, family history, social history, surgical history, and allergies today and no changes were needed.  Please see the problem list section below in epic for further details.  Past Medical History: Past Medical History:  Diagnosis Date  . Allergy   . Anemia   . CAD (coronary artery disease) of artery bypass graft 2009   Status post bypass grafting 2009 , status post stress echo 2012 no ischemia.  . Depression    past hx after heart surgery   . Dermatitis    Left upper extremity  . Diverticulitis 07/11/2017   Status post percutaneous drainage catheter placement for treatment  . Diverticulosis    Severe in sigmoid colon, Status post percutaneous drainage catheter placement for treatment  . Erectile dysfunction   . History of diverticular abscess 07/11/2017   Status post percutaneous drainage catheter placement for treatment, 07/30/17 drain removed  . History of kidney infection 1990  . Hx of adenomatous colonic polyps 09/02/2014  . Hyperlipidemia, mixed   . Hypotension, unspecified   . Myocardial infarction [facility])    per pt had 2 MI- 2009 both   . Numbness and tingling in left hand    S/p graft for CABG  . Sleep apnea    On 8 cm of water CPAP   Past Surgical History: Past Surgical History:  Procedure Laterality Date  . COLONOSCOPY  2016  . COLONOSCOPY  2007  . COLONOSCOPY W/ POLYPECTOMY  10/07/2017  . CORONARY ARTERY BYPASS GRAFT  2009   x4  . CYSTOSCOPY W/ RETROGRADES Bilateral 11/27/2017   Procedure: BILATERAL CYSTOSCOPY WITH RETROGRADE PYELOGRAM, BILATERAL URETERAL CATHETERIZATION;  Surgeon: Lucas Mallow, MD;  [facility];  Service: Urology;  Laterality: Bilateral;   . FLEXIBLE SIGMOIDOSCOPY N/A 11/27/2017   Procedure: FLEXIBLE SIGMOIDOSCOPY;  Surgeon: Ileana Roup, MD;  [facility];  Service: General;  Laterality: N/A;  . IR RADIOLOGIST EVAL & MGMT  07/30/2017  . LAPAROSCOPIC SIGMOID COLECTOMY N/A 11/27/2017   Procedure: LAPAROSCOPIC SIGMOIDECTOMY;  Surgeon: Ileana Roup, MD;  [facility];  Service: General;  Laterality: N/A;   Social History: Social History   Socioeconomic History  . Marital status: Married    Spouse name: Not on file  . Number of children: Not on file  . Years of education: Not on file  . Highest education level: Not on file  Occupational History  . Occupation: Full time    Employer: [employer]  Social Needs  . Financial resource strain: Not on file  . Food insecurity:    Worry: Not on file    Inability: Not on file  . Transportation needs:    Medical: Not on file    Non-medical: Not on file  Tobacco Use  . Smoking status: Former Smoker    Packs/day: 0.20    Years: 5.00    Pack years: 1.00    Types: Cigarettes, Cigars    Start date: 05/28/2000    Last attempt to quit: 05/07/2005    Years since quitting: 13.2  . Smokeless tobacco: Former Systems developer    Types: Chew    Quit date: 05/07/1997  . Tobacco comment: chewed tobacco x 20 yrs - quit 2000  Substance  and Sexual Activity  . Alcohol use: Yes    Alcohol/week: 15.0 standard drinks    Types: 10 Shots of liquor, 5 Standard drinks or equivalent per week    Comment: beer & liquor  . Drug use: No  . Sexual activity: Yes    Partners: Female  Lifestyle  . Physical activity:    Days per week: Not on file    Minutes per session: Not on file  . Stress: Not on file  Relationships  . Social connections:    Talks on phone: Not on file    Gets together: Not on file    Attends religious service: Not on file    Active member of club or organization: Not on file    Attends meetings of clubs or organizations: Not on file    Relationship status: Not on  file  Other Topics Concern  . Not on file  Social History Narrative  . Not on file   Family History: Family History  Problem Relation Age of Onset  . Heart disease Mother   . Heart disease Father   . Colon cancer Paternal Uncle   . Kidney disease Other   . Coronary artery disease Other   . Heart failure Other   . Esophageal cancer Neg Hx   . Prostate cancer Neg Hx   . Rectal cancer Neg Hx   . Stomach cancer Neg Hx   . Colon polyps Neg Hx    Allergies: Allergies  Allergen Reactions  . Sulfonamide Derivatives Hives, Shortness Of Breath and Rash  . Oxycodone Hives    fever   Medications: See med rec.  Review of Systems: No fevers, chills, night sweats, weight loss, chest pain, or shortness of breath.   Objective:    General: Well Developed, well nourished, and in no acute distress.  Neuro: Alert and oriented x3, extra-ocular muscles intact, sensation grossly intact.  HEENT: Normocephalic, atraumatic, pupils equal round reactive to light, neck supple, no masses, no lymphadenopathy, thyroid nonpalpable.  Skin: Warm and dry, no rashes. Cardiac: Regular rate and rhythm, no murmurs rubs or gallops, no lower extremity edema.  Respiratory: Clear to auscultation bilaterally. Not using accessory muscles, speaking in full sentences.  Impression and Recommendations:    Sleep apnea Face-to-face visit performed today. Unfortunately insurance required this. Excessive daytime sleepiness, neck circumference greater than 17, overweight. Repeat order/referral placed for home sleep study and CPAP with [facility]. Historically he was on 8 cm H2O. Stop bang score = 7   ___________________________________________ [name]. [name], M.D., ABFM., CAQSM. Primary Care and Sports Medicine [place] [facility]  Adjunct Professor of [employer] [facility] [employer]

## 2018-08-11 ENCOUNTER — Encounter: Payer: Self-pay | Admitting: Sports Medicine

## 2018-08-19 ENCOUNTER — Other Ambulatory Visit: Payer: Self-pay | Admitting: Cardiology

## 2018-08-22 ENCOUNTER — Encounter: Payer: No Typology Code available for payment source | Admitting: Sports Medicine

## 2018-08-29 ENCOUNTER — Encounter: Payer: No Typology Code available for payment source | Admitting: Sports Medicine

## 2018-10-07 ENCOUNTER — Telehealth (INDEPENDENT_AMBULATORY_CARE_PROVIDER_SITE_OTHER): Payer: No Typology Code available for payment source | Admitting: Cardiology

## 2018-10-07 ENCOUNTER — Encounter: Payer: Self-pay | Admitting: Cardiology

## 2018-10-07 VITALS — Ht 75.0 in | Wt 230.0 lb

## 2018-10-07 DIAGNOSIS — I2510 Atherosclerotic heart disease of native coronary artery without angina pectoris: Secondary | ICD-10-CM

## 2018-10-07 DIAGNOSIS — G4733 Obstructive sleep apnea (adult) (pediatric): Secondary | ICD-10-CM

## 2018-10-07 DIAGNOSIS — E782 Mixed hyperlipidemia: Secondary | ICD-10-CM

## 2018-10-07 MED ORDER — EZETIMIBE 10 MG PO TABS: 10.0000 mg | ORAL_TABLET | Freq: Every day | ORAL | 6 refills | Status: DC

## 2018-10-07 NOTE — Progress Notes (Signed)
Virtual Visit via Video Note   This visit type was conducted due to national recommendations for restrictions regarding the COVID-19 Pandemic (e.g. social distancing) in an effort to limit this patient's exposure and mitigate transmission in our community.  Due to his co-morbid illnesses, this patient is at least at moderate risk for complications without adequate follow up.  This format is felt to be most appropriate for this patient at this time.  All issues noted in this document were discussed and addressed.  A limited physical exam was performed with this format.  Please refer to the patient's chart for his consent to telehealth for [facility]   Date:  10/07/2018   ID:  [name], DOB 08-Mar-1966, MRN [id number]  Patient Location: Home Provider Location: Home  PCP:  Silverio Decamp, MD  Cardiologist:  Dr Carlyle Dolly MD Electrophysiologist:  None   Evaluation Performed:  Follow-Up Visit  Chief Complaint:  1 year follow up  History of Present Illness:    [name] is a 53 y.o. male seen today for follow up of the following medical problems.   1. CAD - CABG in 2009 4 vessel (LIMA-LAD, left radial to OM, SVG-diag, SVG-RCA) - 2012 DSE with baseline LVEF 55-60%, no ischemia - no chest pain. No SOB or DOE.  -meds limited due to low bp's, has not been on beta blocker or ACE-I    - no recent chest pain.  - no SOB/DOE  - compliant with meds  2. Hyperlipidemia - compliant with statin Jan 2020 TC 139 HDL 38 TG 107 LDL 81  3. OSA - on CPAP, has had trouble establishing for follow up for his OSA   SH: works for [employer] Daughter at [employer] and graduates this coming fall, looking to start grad school in [place]     The patient does not have symptoms concerning for COVID-19 infection (fever, chills, cough, or new shortness of breath).    Past Medical History:  Diagnosis Date  . Allergy   . Anemia   . CAD (coronary artery  disease) of artery bypass graft 2009   Status post bypass grafting 2009 , status post stress echo 2012 no ischemia.  . Depression    past hx after heart surgery   . Dermatitis    Left upper extremity  . Diverticulitis 07/11/2017   Status post percutaneous drainage catheter placement for treatment  . Diverticulosis    Severe in sigmoid colon, Status post percutaneous drainage catheter placement for treatment  . Erectile dysfunction   . History of diverticular abscess 07/11/2017   Status post percutaneous drainage catheter placement for treatment, 07/30/17 drain removed  . History of kidney infection 1990  . Hx of adenomatous colonic polyps 09/02/2014  . Hyperlipidemia, mixed   . Hypotension, unspecified   . Myocardial infarction [facility])    per pt had 2 MI- 2009 both   . Numbness and tingling in left hand    S/p graft for CABG  . Sleep apnea    On 8 cm of water CPAP   Past Surgical History:  Procedure Laterality Date  . COLONOSCOPY  2016  . COLONOSCOPY  2007  . COLONOSCOPY W/ POLYPECTOMY  10/07/2017  . CORONARY ARTERY BYPASS GRAFT  2009   x4  . CYSTOSCOPY W/ RETROGRADES Bilateral 11/27/2017   Procedure: BILATERAL CYSTOSCOPY WITH RETROGRADE PYELOGRAM, BILATERAL URETERAL CATHETERIZATION;  Surgeon: Lucas Mallow, MD;  [facility];  Service: Urology;  Laterality:  Bilateral;  . FLEXIBLE SIGMOIDOSCOPY N/A 11/27/2017   Procedure: FLEXIBLE SIGMOIDOSCOPY;  Surgeon: Ileana Roup, MD;  [facility];  Service: General;  Laterality: N/A;  . IR RADIOLOGIST EVAL & MGMT  07/30/2017  . LAPAROSCOPIC SIGMOID COLECTOMY N/A 11/27/2017   Procedure: LAPAROSCOPIC SIGMOIDECTOMY;  Surgeon: Ileana Roup, MD;  [facility];  Service: General;  Laterality: N/A;     Current Meds  Medication Sig  . aspirin 81 MG tablet Take 81 mg by mouth every evening.   . Calcium Carbonate-Vitamin D (CALCIUM 500/D) 500-125 MG-UNIT TABS Take by mouth.  . cholecalciferol (VITAMIN D) 1000 units  tablet Take 1,000 Units by mouth every evening.  [name] ibuprofen (ADVIL,MOTRIN) 200 MG tablet Take 400-600 mg by mouth 3 (three) times daily as needed for headache or moderate pain.  . meloxicam (MOBIC) 15 MG tablet One tab PO qAM with breakfast for 2 weeks, then daily prn pain.  . montelukast (SINGULAIR) 10 MG tablet TAKE 1 TABLET AT BEDTIME  . Multiple Vitamin (MULTIVITAMIN) tablet Take 1 tablet by mouth every evening.   . Omega-3 Fatty Acids (FISH OIL) 1200 MG CAPS Take 1,200 mg by mouth every evening.  . rosuvastatin (CRESTOR) 40 MG tablet TAKE 1 TABLET DAILY     Allergies:   Sulfonamide derivatives and Oxycodone   Social History   Tobacco Use  . Smoking status: Former Smoker    Packs/day: 0.20    Years: 5.00    Pack years: 1.00    Types: Cigarettes, Cigars    Start date: 05/28/2000    Last attempt to quit: 05/07/2005    Years since quitting: 13.4  . Smokeless tobacco: Former Systems developer    Types: Chew    Quit date: 05/07/1997  . Tobacco comment: chewed tobacco x 20 yrs - quit 2000  Substance Use Topics  . Alcohol use: Yes    Alcohol/week: 15.0 standard drinks    Types: 10 Shots of liquor, 5 Standard drinks or equivalent per week    Comment: beer & liquor  . Drug use: No     Family Hx: The patient's family history includes Colon cancer in his paternal uncle; Coronary artery disease in an other family member; Heart disease in his father and mother; Heart failure in an other family member; Kidney disease in an other family member. There is no history of Esophageal cancer, Prostate cancer, Rectal cancer, Stomach cancer, or Colon polyps.  ROS:   Please see the history of present illness.    All other systems reviewed and are negative.   Prior CV studies:   The following studies were reviewed today:  12/2007 ANGIOGRAPHIC DATA:  1. The left main is free of critical disease.  2. The LAD courses to the apex. The LAD has multiple lesions  including a 70% proximal mid and 80% lesion at  the takeoff of the  third diagonal. There were then in the distal vessel multiple  lesions including a 90, 70, 50, 90, and 90 in sequence. There is a  70% apical stenosis as well. The first diagonal Ambyr Qadri is small-to-  moderate in size with 90% ostial narrowing. Second diagonal is  fairly large in size with 80% proximal and 80% mid narrowing.  Second diagonal does appear to be graftable. The third diagonal  has 90% ostial narrowing.  3. The circumflex has a tiny second marginal with 90% narrowing that  was totally occluded and it started filling late by collaterals.  4. The right coronary artery is  totally occluded proximally and fills  in by left-to-right collaterals.  5. Ventriculography in the RAO projection reveals estimated ejection  fraction of 45-50%. There is inferior wall motion abnormality in  the mid inferior wall corresponding the circumflex territory and  anteroapical abnormality, that is mild.  CONCLUSIONS:  1. Total occlusion of the circumflex and right coronary arteries.  2. Minimum 8 successive lesions in the left anterior descending  artery, and including high-grade disease of the large second  diagonal Marjon Doxtater as noted above.  3. Progressive symptoms compatible with angina pectoris.  RECOMMENDATIONS: The LAD is not ideal for grafting. Nonetheless, the  apical vessel possibly could be grafted as well as the large diagonal.  In addition, the marginal and the RCA also appear to be optimal for  grafting. Surgical consultation will be obtained.  Labs/Other Tests and Data Reviewed:    EKG:  No ECG reviewed.  Recent Labs: 11/30/2017: Magnesium 2.2 05/23/2018: ALT 34; BUN 14; Creat 0.93; Hemoglobin 16.7; Platelets 340; Potassium 4.2; Sodium 137; TSH 3.69   Recent Lipid Panel Lab Results  Component Value Date/Time   CHOL 139 05/23/2018 10:39 AM   TRIG 107 05/23/2018 10:39 AM   HDL 38 (L) 05/23/2018 10:39 AM   CHOLHDL 3.7 05/23/2018 10:39 AM    LDLCALC 81 05/23/2018 10:39 AM    Wt Readings from Last 3 Encounters:  10/07/18 230 lb (104.3 kg)  07/29/18 239 lb (108.4 kg)  05/23/18 229 lb (103.9 kg)     Objective:    Vital Signs:  Ht 6\' 3"  (1.905 m)   Wt 230 lb (104.3 kg)   BMI 28.75 kg/m    Well nourished male sitting comfortable in on apparent distress. Normal affect. Normal speech pattern and tone. No visual or audible signs of SOB or wheezing.   ASSESSMENT & PLAN:    1. CAD -doing well, no symptoms. Continue current meds. Medical limiated limited by low normal bp's  2. Hyperlipidemia - LDL above goal of <70. Start zetia 10mg  daily. Multivessel CABG at early age, would be aggressive with risk factor modification  3. OSA - refer to Dr Radford Pax to help manage.   COVID-19 Education: The signs and symptoms of COVID-19 were discussed with the patient and how to seek care for testing (follow up with PCP or arrange E-visit).  The importance of social distancing was discussed today.  Time:   Today, I have spent 18 minutes with the patient with telehealth technology discussing the above problems.     Medication Adjustments/Labs and Tests Ordered: Current medicines are reviewed at length with the patient today.  Concerns regarding medicines are outlined above.   Tests Ordered: No orders of the defined types were placed in this encounter.   Medication Changes: No orders of the defined types were placed in this encounter.   Disposition:  Follow up 1 year  Signed, Carlyle Dolly, MD  10/07/2018 2:57 PM    [place]

## 2018-10-07 NOTE — Patient Instructions (Addendum)
Medication Instructions:   Begin Zetia 10mg  daily - new prescription sent to pharmacy today.   Continue all other medications.    Labwork: none  Testing/Procedures: none  Follow-Up: Your physician wants you to follow up in:  1 year.  You will receive a reminder letter in the mail one-two months in advance.  If you don't receive a letter, please call our office to schedule the follow up appointment   Any Other Special Instructions Will Be Listed Below (If Applicable). You have been referred to:  Dr. Fransico Him to be evaluated for sleep apnea.  If you need a refill on your cardiac medications before your next appointment, please call your pharmacy.

## 2018-10-07 NOTE — Addendum Note (Signed)
Addended by: Laurine Blazer on: 10/07/2018 03:24 PM   Modules accepted: Orders

## 2018-10-16 ENCOUNTER — Telehealth: Payer: Self-pay | Admitting: *Deleted

## 2018-10-16 NOTE — Telephone Encounter (Signed)
-----   Message from Chanda Busing sent at 10/16/2018 12:28 PM EDT ----- Regarding: SLEEP STUDY REFERRAL Dr. Harl Bowie is referring patient for a sleep study -Diagnosis  obstructive sleep apnea

## 2018-10-21 NOTE — Telephone Encounter (Signed)
   TELEPHONE CALL NOTE  This patient has been deemed a candidate for follow-up tele-health visit to limit community exposure during the Covid-19 pandemic. I spoke with the patient via phone to discuss instructions. This has been outlined on the patient's AVS (dotphrase: [email or web address]). The patient was advised to review the section on consent for treatment as well. The patient will receive a phone call 2-3 days prior to their E-Visit at which time consent will be verbally confirmed.   A Virtual Office Visit appointment type has been scheduled for 12/04/18 with TT with "VIDEO" or "TELEPHONE" in the appointment notes - patient prefers VIDEO type.  Marolyn Hammock, [place] 10/21/2018 2:43 PM

## 2018-12-04 ENCOUNTER — Telehealth: Payer: No Typology Code available for payment source | Admitting: Cardiology

## 2018-12-12 ENCOUNTER — Telehealth: Payer: Self-pay

## 2018-12-12 NOTE — Telephone Encounter (Signed)

## 2018-12-15 ENCOUNTER — Other Ambulatory Visit: Payer: Self-pay | Admitting: *Deleted

## 2018-12-15 ENCOUNTER — Other Ambulatory Visit: Payer: Self-pay | Admitting: Cardiology

## 2018-12-15 DIAGNOSIS — E669 Obesity, unspecified: Secondary | ICD-10-CM | POA: Insufficient documentation

## 2018-12-15 MED ORDER — ROSUVASTATIN CALCIUM 40 MG PO TABS: 40.0000 mg | ORAL_TABLET | Freq: Every day | ORAL | 3 refills | Status: DC

## 2018-12-15 NOTE — Progress Notes (Signed)
Virtual Visit via Video Note   This visit type was conducted due to national recommendations for restrictions regarding the COVID-19 Pandemic (e.g. social distancing) in an effort to limit this patient's exposure and mitigate transmission in our community.  Due to his co-morbid illnesses, this patient is at least at moderate risk for complications without adequate follow up.  This format is felt to be most appropriate for this patient at this time.  All issues noted in this document were discussed and addressed.  A limited physical exam was performed with this format.  Please refer to the patient's chart for his consent to telehealth for [facility].   Evaluation Performed:  Follow-up visit  This visit type was conducted due to national recommendations for restrictions regarding the COVID-19 Pandemic (e.g. social distancing).  This format is felt to be most appropriate for this patient at this time.  All issues noted in this document were discussed and addressed.  No physical exam was performed (except for noted visual exam findings with Video Visits).  Please refer to the patient's chart (MyChart message for video visits and phone note for telephone visits) for the patient's consent to telehealth for [facility].  Date:  12/16/2018   ID:  [name], DOB April 28, 1966, MRN [id number]  Patient Location:  Home  Provider location:   [place]  PCP:  Silverio Decamp, MD  Cardiologist:  Fransico Him, MD Electrophysiologist:  None   Chief Complaint:  OSA  History of Present Illness:    [name] is a 53 y.o. male who presents via audio/video conferencing for a telehealth visit today.    [name] with a hx of ASCAD with remote CABG, HLD and OSA who has not followed up with a sleep medicine MD and is referred by Dr. Harl Bowie. His initial sleep study was done in 2012 and Dr. Redmond Pulling ordered his PAP.  He had mild OSA with an AHI of 9/hr and has been on CPAP  at 8cm H2O since then.  His machine stopped working last March and due to the COVID 19 virus he never got a new one.  He has been having a lot of problems with excessive daytime sleepiness, frequent awakenings at night, snoring and napping during the day and would like a new device.   He used a nasal mask in the past which he tolerated well.   The patient does not have symptoms concerning for COVID-19 infection (fever, chills, cough, or new shortness of breath).    Prior CV studies:   The following studies were reviewed today:  PAP compliance download  Past Medical History:  Diagnosis Date  . Allergy   . Anemia   . CAD (coronary artery disease) of artery bypass graft 2009   Status post bypass grafting 2009 , status post stress echo 2012 no ischemia.  . Depression    past hx after heart surgery   . Dermatitis    Left upper extremity  . Diverticulitis 07/11/2017   Status post percutaneous drainage catheter placement for treatment  . Diverticulosis    Severe in sigmoid colon, Status post percutaneous drainage catheter placement for treatment  . Erectile dysfunction   . History of diverticular abscess 07/11/2017   Status post percutaneous drainage catheter placement for treatment, 07/30/17 drain removed  . History of kidney infection 1990  . Hx of adenomatous colonic polyps 09/02/2014  . Hyperlipidemia, mixed   . Hypotension, unspecified   . Myocardial infarction (  [place])    per pt had 2 MI- 2009 both   . Numbness and tingling in left hand    S/p graft for CABG  . Sleep apnea    On 8 cm of water CPAP   Past Surgical History:  Procedure Laterality Date  . COLONOSCOPY  2016  . COLONOSCOPY  2007  . COLONOSCOPY W/ POLYPECTOMY  10/07/2017  . CORONARY ARTERY BYPASS GRAFT  2009   x4  . CYSTOSCOPY W/ RETROGRADES Bilateral 11/27/2017   Procedure: BILATERAL CYSTOSCOPY WITH RETROGRADE PYELOGRAM, BILATERAL URETERAL CATHETERIZATION;  Surgeon: Lucas Mallow, MD;  [facility]: [facility];   Service: Urology;  Laterality: Bilateral;  . FLEXIBLE SIGMOIDOSCOPY N/A 11/27/2017   Procedure: FLEXIBLE SIGMOIDOSCOPY;  Surgeon: Ileana Roup, MD;  [facility];  Service: General;  Laterality: N/A;  . IR RADIOLOGIST EVAL & MGMT  07/30/2017  . LAPAROSCOPIC SIGMOID COLECTOMY N/A 11/27/2017   Procedure: LAPAROSCOPIC SIGMOIDECTOMY;  Surgeon: Ileana Roup, MD;  [facility];  Service: General;  Laterality: N/A;     Current Meds  Medication Sig  . aspirin 81 MG tablet Take 81 mg by mouth every evening.   . Calcium Carbonate-Vitamin D (CALCIUM 500/D) 500-125 MG-UNIT TABS Take by mouth.  . cholecalciferol (VITAMIN D) 1000 units tablet Take 1,000 Units by mouth every evening.  [name] ibuprofen (ADVIL,MOTRIN) 200 MG tablet Take 400-600 mg by mouth 3 (three) times daily as needed for headache or moderate pain.  . montelukast (SINGULAIR) 10 MG tablet TAKE 1 TABLET AT BEDTIME  . Multiple Vitamin (MULTIVITAMIN) tablet Take 1 tablet by mouth every evening.   . Omega-3 Fatty Acids (FISH OIL) 1200 MG CAPS Take 1,200 mg by mouth every evening.  . rosuvastatin (CRESTOR) 40 MG tablet Take 1 tablet (40 mg total) by mouth daily.     Allergies:   Sulfonamide derivatives and Oxycodone   Social History   Tobacco Use  . Smoking status: Former Smoker    Packs/day: 0.20    Years: 5.00    Pack years: 1.00    Types: Cigarettes, Cigars    Start date: 05/28/2000    Quit date: 05/07/2005    Years since quitting: 13.6  . Smokeless tobacco: Former Systems developer    Types: Chew    Quit date: 05/07/1997  . Tobacco comment: chewed tobacco x 20 yrs - quit 2000  Substance Use Topics  . Alcohol use: Yes    Alcohol/week: 15.0 standard drinks    Types: 10 Shots of liquor, 5 Standard drinks or equivalent per week    Comment: beer & liquor  . Drug use: No     Family Hx: The patient's family history includes Colon cancer in his paternal uncle; Coronary artery disease in an other family member; Heart disease in  his father and mother; Heart failure in an other family member; Kidney disease in an other family member. There is no history of Esophageal cancer, Prostate cancer, Rectal cancer, Stomach cancer, or Colon polyps.  ROS:   Please see the history of present illness.     All other systems reviewed and are negative.   Labs/Other Tests and Data Reviewed:    Recent Labs: 05/23/2018: ALT 34; BUN 14; Creat 0.93; Hemoglobin 16.7; Platelets 340; Potassium 4.2; Sodium 137; TSH 3.69   Recent Lipid Panel Lab Results  Component Value Date/Time   CHOL 139 05/23/2018 10:39 AM   TRIG 107 05/23/2018 10:39 AM   HDL 38 (L) 05/23/2018 10:39 AM   CHOLHDL 3.7 05/23/2018  10:39 AM   LDLCALC 81 05/23/2018 10:39 AM    Wt Readings from Last 3 Encounters:  12/16/18 230 lb (104.3 kg)  10/07/18 230 lb (104.3 kg)  07/29/18 239 lb (108.4 kg)     Objective:    Vital Signs:  BP 114/71   Pulse 78   Ht 6\' 3"  (1.905 m)   Wt 230 lb (104.3 kg)   BMI 28.75 kg/m    CONSTITUTIONAL:  Well nourished, well developed male in no acute distress.  EYES: anicteric MOUTH: oral mucosa is pink RESPIRATORY: Normal respiratory effort, symmetric expansion CARDIOVASCULAR: No peripheral edema SKIN: No rash, lesions or ulcers MUSCULOSKELETAL: no digital cyanosis NEURO: Cranial Nerves II-XII grossly intact, moves all extremities PSYCH: Intact judgement and insight.  A&O x 3, Mood/affect appropriate   ASSESSMENT & PLAN:    1.  OSA - he has a hx of mild OSA and had been on PAP at 8cm H2O until his machine stopped working in march.  He had done very well with his PAP therapy prior to that.  I will order him a new ResMed CPAP with heated humidity and mask of choice set on 8cm H2O.  He will see me back for virtual visit in 10 weeks after receiving his new device to document compliance.    2.  Obesity - I have encouraged him to get into a routine exercise program and cut back on carbs and portions.   3.  Hypertension - BP is  controlled at home.  He has not required antihypertensive meds.   COVID-19 Education: The signs and symptoms of COVID-19 were discussed with the patient and how to seek care for testing (follow up with PCP or arrange E-visit).  The importance of social distancing was discussed today.  Patient Risk:   After full review of this patient's clinical status, I feel that they are at least moderate risk at this time.  Time:   Today, I have spent 20 minutes on telemedicine discussing medical problems including OSA.  We also reviewed the symptoms of COVID 19 and the ways to protect against contracting the virus with telehealth technology.  I spent an additional 5 minutes reviewing patient's chart including prior sleep study in 2012  Medication Adjustments/Labs and Tests Ordered: Current medicines are reviewed at length with the patient today.  Concerns regarding medicines are outlined above.  Tests Ordered: No orders of the defined types were placed in this encounter.  Medication Changes: No orders of the defined types were placed in this encounter.   Disposition:  Follow up in 1 year(s)  Signed, Fransico Him, MD  12/16/2018 8:36 AM    [facility]

## 2018-12-16 ENCOUNTER — Telehealth (INDEPENDENT_AMBULATORY_CARE_PROVIDER_SITE_OTHER): Payer: No Typology Code available for payment source | Admitting: Cardiology

## 2018-12-16 ENCOUNTER — Other Ambulatory Visit: Payer: Self-pay

## 2018-12-16 VITALS — BP 114/71 | HR 78 | Ht 75.0 in | Wt 230.0 lb

## 2018-12-16 DIAGNOSIS — Z6828 Body mass index (BMI) 28.0-28.9, adult: Secondary | ICD-10-CM

## 2018-12-16 DIAGNOSIS — I10 Essential (primary) hypertension: Secondary | ICD-10-CM | POA: Diagnosis not present

## 2018-12-16 DIAGNOSIS — E669 Obesity, unspecified: Secondary | ICD-10-CM

## 2018-12-16 DIAGNOSIS — G4733 Obstructive sleep apnea (adult) (pediatric): Secondary | ICD-10-CM | POA: Diagnosis not present

## 2018-12-17 ENCOUNTER — Telehealth: Payer: Self-pay | Admitting: *Deleted

## 2018-12-17 DIAGNOSIS — G4733 Obstructive sleep apnea (adult) (pediatric): Secondary | ICD-10-CM

## 2018-12-17 NOTE — Telephone Encounter (Signed)
Order placed to [facility] per patient for: ResMed CPAP with heated humidity and mask of choice set on 8cm H2O. Upon patient request DME selection is [place]. Patient understands he will be contacted by [facility] to set up his cpap. Patient understands to call if [facility] does not contact him with new setup in a timely manner. Patient understands they will be called once confirmation has been received from [facility] that they have received their new machine to schedule 10 week follow up appointment.  [facility] notified of new cpap order  Please add to airview Patient was grateful for the call and thanked me.

## 2018-12-17 NOTE — Telephone Encounter (Signed)
-----   Message from Dollene Primrose, RN sent at 12/16/2018  8:54 AM EDT ----- Regarding: Cpap changes   Hi Gae Bon,  Dr. Radford Pax wanted me to forward this to you. I did not make the follow up appt with her since he will need to wait until he receives his stuff.  Thanks!  Lorren  ----- Message ----- From: Sueanne Margarita, MD Sent: 12/16/2018   8:47 AM EDT To: Dollene Primrose, RN  Please send this to [facility]  ResMed CPAP with heated humidity and mask of choice set on 8cm H2O.  He will see me back for virtual visit in 10 weeks after receiving his new device to document compliance.

## 2019-01-27 ENCOUNTER — Encounter: Payer: Self-pay | Admitting: Sports Medicine

## 2019-01-27 ENCOUNTER — Ambulatory Visit: Payer: No Typology Code available for payment source | Admitting: Sports Medicine

## 2019-01-27 ENCOUNTER — Other Ambulatory Visit: Payer: Self-pay

## 2019-01-27 ENCOUNTER — Ambulatory Visit (INDEPENDENT_AMBULATORY_CARE_PROVIDER_SITE_OTHER): Payer: No Typology Code available for payment source | Admitting: Sports Medicine

## 2019-01-27 DIAGNOSIS — M7711 Lateral epicondylitis, right elbow: Secondary | ICD-10-CM

## 2019-01-27 NOTE — Assessment & Plan Note (Signed)
Has failed bracing for over a month. Right common extensor tendon origin injection as above, rehab exercises given, return in a month.

## 2019-01-27 NOTE — Progress Notes (Signed)
Subjective:    CC: Right elbow pain  HPI: This is a very pleasant 53 year old male, for some time now he has had pain in his right elbow, lateral aspect, worse with gripping.  He has tried bracing, NSAIDs, no improvement.  Moderate, persistent, localized without radiation.  I reviewed the past medical history, family history, social history, surgical history, and allergies today and no changes were needed.  Please see the problem list section below in epic for further details.  Past Medical History: Past Medical History:  Diagnosis Date  . Allergy   . Anemia   . CAD (coronary artery disease) of artery bypass graft 2009   Status post bypass grafting 2009 , status post stress echo 2012 no ischemia.  . Depression    past hx after heart surgery   . Dermatitis    Left upper extremity  . Diverticulitis 07/11/2017   Status post percutaneous drainage catheter placement for treatment  . Diverticulosis    Severe in sigmoid colon, Status post percutaneous drainage catheter placement for treatment  . Erectile dysfunction   . History of diverticular abscess 07/11/2017   Status post percutaneous drainage catheter placement for treatment, 07/30/17 drain removed  . History of kidney infection 1990  . Hx of adenomatous colonic polyps 09/02/2014  . Hyperlipidemia, mixed   . Hypotension, unspecified   . Myocardial infarction [facility])    per pt had 2 MI- 2009 both   . Numbness and tingling in left hand    S/p graft for CABG  . Sleep apnea    On 8 cm of water CPAP   Past Surgical History: Past Surgical History:  Procedure Laterality Date  . COLONOSCOPY  2016  . COLONOSCOPY  2007  . COLONOSCOPY W/ POLYPECTOMY  10/07/2017  . CORONARY ARTERY BYPASS GRAFT  2009   x4  . CYSTOSCOPY W/ RETROGRADES Bilateral 11/27/2017   Procedure: BILATERAL CYSTOSCOPY WITH RETROGRADE PYELOGRAM, BILATERAL URETERAL CATHETERIZATION;  Surgeon: Lucas Mallow, MD;  [facility];  Service: Urology;  Laterality:  Bilateral;  . FLEXIBLE SIGMOIDOSCOPY N/A 11/27/2017   Procedure: FLEXIBLE SIGMOIDOSCOPY;  Surgeon: Ileana Roup, MD;  [facility];  Service: General;  Laterality: N/A;  . IR RADIOLOGIST EVAL & MGMT  07/30/2017  . LAPAROSCOPIC SIGMOID COLECTOMY N/A 11/27/2017   Procedure: LAPAROSCOPIC SIGMOIDECTOMY;  Surgeon: Ileana Roup, MD;  [facility];  Service: General;  Laterality: N/A;   Social History: Social History   Socioeconomic History  . Marital status: Married    Spouse name: Not on file  . Number of children: Not on file  . Years of education: Not on file  . Highest education level: Not on file  Occupational History  . Occupation: Full time    Employer: [employer]  Social Needs  . Financial resource strain: Not on file  . Food insecurity    Worry: Not on file    Inability: Not on file  . Transportation needs    Medical: Not on file    Non-medical: Not on file  Tobacco Use  . Smoking status: Former Smoker    Packs/day: 0.20    Years: 5.00    Pack years: 1.00    Types: Cigarettes, Cigars    Start date: 05/28/2000    Quit date: 05/07/2005    Years since quitting: 13.7  . Smokeless tobacco: Former Systems developer    Types: Chew    Quit date: 05/07/1997  . Tobacco comment: chewed tobacco x 20 yrs - quit  2000  Substance and Sexual Activity  . Alcohol use: Yes    Alcohol/week: 15.0 standard drinks    Types: 10 Shots of liquor, 5 Standard drinks or equivalent per week    Comment: beer & liquor  . Drug use: No  . Sexual activity: Yes    Partners: Female  Lifestyle  . Physical activity    Days per week: Not on file    Minutes per session: Not on file  . Stress: Not on file  Relationships  . Social Herbalist on phone: Not on file    Gets together: Not on file    Attends religious service: Not on file    Active member of club or organization: Not on file    Attends meetings of clubs or organizations: Not on file    Relationship status: Not on file   Other Topics Concern  . Not on file  Social History Narrative  . Not on file   Family History: Family History  Problem Relation Age of Onset  . Heart disease Mother   . Heart disease Father   . Colon cancer Paternal Uncle   . Kidney disease Other   . Coronary artery disease Other   . Heart failure Other   . Esophageal cancer Neg Hx   . Prostate cancer Neg Hx   . Rectal cancer Neg Hx   . Stomach cancer Neg Hx   . Colon polyps Neg Hx    Allergies: Allergies  Allergen Reactions  . Sulfonamide Derivatives Hives, Shortness Of Breath and Rash  . Oxycodone Hives    fever   Medications: See med rec.  Review of Systems: No fevers, chills, night sweats, weight loss, chest pain, or shortness of breath.   Objective:    General: Well Developed, well nourished, and in no acute distress.  Neuro: Alert and oriented x3, extra-ocular muscles intact, sensation grossly intact.  HEENT: Normocephalic, atraumatic, pupils equal round reactive to light, neck supple, no masses, no lymphadenopathy, thyroid nonpalpable.  Skin: Warm and dry, no rashes. Cardiac: Regular rate and rhythm, no murmurs rubs or gallops, no lower extremity edema.  Respiratory: Clear to auscultation bilaterally. Not using accessory muscles, speaking in full sentences. Right elbow: Unremarkable to inspection. Range of motion full pronation, supination, flexion, extension. Strength is full to all of the above directions Stable to varus, valgus stress. Negative moving valgus stress test. Tender palpation of the common extensor tendon origin Ulnar nerve does not sublux. Negative cubital tunnel Tinel's.  Procedure: Real-time Ultrasound Guided injection of the right common extensor tendon origin Device: GE Logiq E  Verbal informed consent obtained.  Time-out conducted.  Noted no overlying erythema, induration, or other signs of local infection.  Skin prepped in a sterile fashion.  Local anesthesia: Topical Ethyl chloride.   With sterile technique and under real time ultrasound guidance:  1 cc Kenalog 40, 1 cc lidocaine, 1 cc bupivacaine injected easily Completed without difficulty  Pain immediately resolved suggesting accurate placement of the medication.  Advised to call if fevers/chills, erythema, induration, drainage, or persistent bleeding.  Images permanently stored and available for review in the ultrasound unit.  Impression: Technically successful ultrasound guided injection.  Impression and Recommendations:    Right tennis elbow Has failed bracing for over a month. Right common extensor tendon origin injection as above, rehab exercises given, return in a month.   ___________________________________________ [name]. [name], M.D., ABFM., CAQSM. Primary Care and [place]  Adjunct Professor of [place] of [facility] of Medicine

## 2019-02-24 ENCOUNTER — Ambulatory Visit: Payer: No Typology Code available for payment source | Admitting: Sports Medicine

## 2019-04-16 ENCOUNTER — Other Ambulatory Visit: Payer: Self-pay | Admitting: Cardiology

## 2019-04-27 ENCOUNTER — Other Ambulatory Visit: Payer: Self-pay

## 2019-04-27 ENCOUNTER — Ambulatory Visit (INDEPENDENT_AMBULATORY_CARE_PROVIDER_SITE_OTHER): Payer: No Typology Code available for payment source | Admitting: Sports Medicine

## 2019-04-27 ENCOUNTER — Encounter: Payer: Self-pay | Admitting: Sports Medicine

## 2019-04-27 DIAGNOSIS — R21 Rash and other nonspecific skin eruption: Secondary | ICD-10-CM

## 2019-04-27 MED ORDER — TRIAMCINOLONE ACETONIDE 0.5 % EX CREA: 1.0000 "application " | TOPICAL_CREAM | Freq: Two times a day (BID) | CUTANEOUS | 3 refills | Status: DC

## 2019-04-27 MED ORDER — PREDNISONE 50 MG PO TABS: ORAL_TABLET | ORAL | 0 refills | Status: DC

## 2019-04-27 MED ORDER — TERBINAFINE HCL 250 MG PO TABS: 250.0000 mg | ORAL_TABLET | Freq: Every day | ORAL | 1 refills | Status: DC

## 2019-04-27 NOTE — Progress Notes (Signed)
Subjective:    CC: Rash  HPI: New onset bilateral foot rash, see below for further details.  Intensely pruritic.  Severe, persistent, localized without radiation.  Present for a couple of weeks.  I reviewed the past medical history, family history, social history, surgical history, and allergies today and no changes were needed.  Please see the problem list section below in epic for further details.  Past Medical History: Past Medical History:  Diagnosis Date  . Allergy   . Anemia   . CAD (coronary artery disease) of artery bypass graft 2009   Status post bypass grafting 2009 , status post stress echo 2012 no ischemia.  . Depression    past hx after heart surgery   . Dermatitis    Left upper extremity  . Diverticulitis 07/11/2017   Status post percutaneous drainage catheter placement for treatment  . Diverticulosis    Severe in sigmoid colon, Status post percutaneous drainage catheter placement for treatment  . Erectile dysfunction   . History of diverticular abscess 07/11/2017   Status post percutaneous drainage catheter placement for treatment, 07/30/17 drain removed  . History of kidney infection 1990  . Hx of adenomatous colonic polyps 09/02/2014  . Hyperlipidemia, mixed   . Hypotension, unspecified   . Myocardial infarction [facility])    per pt had 2 MI- 2009 both   . Numbness and tingling in left hand    S/p graft for CABG  . Sleep apnea    On 8 cm of water CPAP   Past Surgical History: Past Surgical History:  Procedure Laterality Date  . COLONOSCOPY  2016  . COLONOSCOPY  2007  . COLONOSCOPY W/ POLYPECTOMY  10/07/2017  . CORONARY ARTERY BYPASS GRAFT  2009   x4  . CYSTOSCOPY W/ RETROGRADES Bilateral 11/27/2017   Procedure: BILATERAL CYSTOSCOPY WITH RETROGRADE PYELOGRAM, BILATERAL URETERAL CATHETERIZATION;  Surgeon: Lucas Mallow, MD;  [facility];  Service: Urology;  Laterality: Bilateral;  . FLEXIBLE SIGMOIDOSCOPY N/A 11/27/2017   Procedure: FLEXIBLE  SIGMOIDOSCOPY;  Surgeon: Ileana Roup, MD;  [facility];  Service: General;  Laterality: N/A;  . IR RADIOLOGIST EVAL & MGMT  07/30/2017  . LAPAROSCOPIC SIGMOID COLECTOMY N/A 11/27/2017   Procedure: LAPAROSCOPIC SIGMOIDECTOMY;  Surgeon: Ileana Roup, MD;  [facility];  Service: General;  Laterality: N/A;   Social History: Social History   Socioeconomic History  . Marital status: Married    Spouse name: Not on file  . Number of children: Not on file  . Years of education: Not on file  . Highest education level: Not on file  Occupational History  . Occupation: Full time    Employer: [employer]  Tobacco Use  . Smoking status: Former Smoker    Packs/day: 0.20    Years: 5.00    Pack years: 1.00    Types: Cigarettes, Cigars    Start date: 05/28/2000    Quit date: 05/07/2005    Years since quitting: 13.9  . Smokeless tobacco: Former Systems developer    Types: Chew    Quit date: 05/07/1997  . Tobacco comment: chewed tobacco x 20 yrs - quit 2000  Substance and Sexual Activity  . Alcohol use: Yes    Alcohol/week: 15.0 standard drinks    Types: 10 Shots of liquor, 5 Standard drinks or equivalent per week    Comment: beer & liquor  . Drug use: No  . Sexual activity: Yes    Partners: Female  Other Topics Concern  .  Not on file  Social History Narrative  . Not on file   Social Determinants of Health   Financial Resource Strain:   . Difficulty of Paying Living Expenses: Not on file  Food Insecurity:   . Worried About Charity fundraiser in the Last Year: Not on file  . Ran Out of Food in the Last Year: Not on file  Transportation Needs:   . Lack of Transportation (Medical): Not on file  . Lack of Transportation (Non-Medical): Not on file  Physical Activity:   . Days of Exercise per Week: Not on file  . Minutes of Exercise per Session: Not on file  Stress:   . Feeling of Stress : Not on file  Social Connections:   . Frequency of Communication with Friends and  Family: Not on file  . Frequency of Social Gatherings with Friends and Family: Not on file  . Attends Religious Services: Not on file  . Active Member of Clubs or Organizations: Not on file  . Attends Archivist Meetings: Not on file  . Marital Status: Not on file   Family History: Family History  Problem Relation Age of Onset  . Heart disease Mother   . Heart disease Father   . Colon cancer Paternal Uncle   . Kidney disease Other   . Coronary artery disease Other   . Heart failure Other   . Esophageal cancer Neg Hx   . Prostate cancer Neg Hx   . Rectal cancer Neg Hx   . Stomach cancer Neg Hx   . Colon polyps Neg Hx    Allergies: Allergies  Allergen Reactions  . Sulfonamide Derivatives Hives, Shortness Of Breath and Rash  . Oxycodone Hives    fever   Medications: See med rec.  Review of Systems: No fevers, chills, night sweats, weight loss, chest pain, or shortness of breath.   Objective:    General: Well Developed, well nourished, and in no acute distress.  Neuro: Alert and oriented x3, extra-ocular muscles intact, sensation grossly intact.  HEENT: Normocephalic, atraumatic, pupils equal round reactive to light, neck supple, no masses, no lymphadenopathy, thyroid nonpalpable.  Skin: Warm and dry, no rashes. Cardiac: Regular rate and rhythm, no murmurs rubs or gallops, no lower extremity edema.  Respiratory: Clear to auscultation bilaterally. Not using accessory muscles, speaking in full sentences. Right foot:   Impression and Recommendations:    Rash of both feet Severe dermatitis. We are to cover for fungus as well. 5 days of prednisone, topical triamcinolone, Lamisil for 2 weeks. Return to see me in 1 to 2 weeks.   ___________________________________________ [name]. [name], M.D., ABFM., CAQSM. Primary Care and Sports Medicine [facility]  Adjunct Professor of [place] of [facility] of  Medicine

## 2019-04-27 NOTE — Patient Instructions (Signed)

## 2019-04-27 NOTE — Assessment & Plan Note (Signed)
Severe dermatitis. We are to cover for fungus as well. 5 days of prednisone, topical triamcinolone, Lamisil for 2 weeks. Return to see me in 1 to 2 weeks.

## 2019-04-28 ENCOUNTER — Ambulatory Visit: Payer: No Typology Code available for payment source | Admitting: Sports Medicine

## 2019-05-01 ENCOUNTER — Other Ambulatory Visit: Payer: Self-pay | Admitting: Sports Medicine

## 2019-05-01 DIAGNOSIS — J0110 Acute frontal sinusitis, unspecified: Secondary | ICD-10-CM

## 2019-05-12 ENCOUNTER — Other Ambulatory Visit: Payer: Self-pay

## 2019-05-12 ENCOUNTER — Encounter: Payer: Self-pay | Admitting: Sports Medicine

## 2019-05-12 ENCOUNTER — Ambulatory Visit (INDEPENDENT_AMBULATORY_CARE_PROVIDER_SITE_OTHER): Payer: No Typology Code available for payment source | Admitting: Sports Medicine

## 2019-05-12 DIAGNOSIS — R21 Rash and other nonspecific skin eruption: Secondary | ICD-10-CM | POA: Diagnosis not present

## 2019-05-12 MED ORDER — CLOTRIMAZOLE-BETAMETHASONE 1-0.05 % EX CREA: 1.0000 "application " | TOPICAL_CREAM | Freq: Two times a day (BID) | CUTANEOUS | 0 refills | Status: DC

## 2019-05-12 NOTE — Assessment & Plan Note (Addendum)
[name] has been treated with prednisone, oral Lamisil, topical triamcinolone for a severe foot dermatitis that appears fungal.   He has noted fantastic improvements with prednisone, Lamisil, topical triamcinolone. Still has a bit of residual rash, symptoms are still not at goal, switching to topical Lotrisone to be applied twice daily for 2 more weeks. Return to see me as needed.

## 2019-05-12 NOTE — Progress Notes (Addendum)
    Procedures performed today:    None.  Independent interpretation of tests performed by another provider:   None.  Impression and Recommendations:      Rash of both feet [name] has been treated with prednisone, oral Lamisil, topical triamcinolone for a severe foot dermatitis that appears fungal.   He has noted fantastic improvements with prednisone, Lamisil, topical triamcinolone. Still has a bit of residual rash, symptoms are still not at goal, switching to topical Lotrisone to be applied twice daily for 2 more weeks. Return to see me as needed.   ___________________________________________ [name]. [name], M.D., ABFM., CAQSM. Primary Care and [place] Instructor of [place] of [facility] of Medicine

## 2019-05-22 ENCOUNTER — Other Ambulatory Visit: Payer: Self-pay | Admitting: Cardiology

## 2019-06-12 ENCOUNTER — Ambulatory Visit (INDEPENDENT_AMBULATORY_CARE_PROVIDER_SITE_OTHER): Payer: No Typology Code available for payment source

## 2019-06-12 ENCOUNTER — Other Ambulatory Visit: Payer: Self-pay

## 2019-06-12 ENCOUNTER — Ambulatory Visit (INDEPENDENT_AMBULATORY_CARE_PROVIDER_SITE_OTHER): Payer: No Typology Code available for payment source | Admitting: Sports Medicine

## 2019-06-12 ENCOUNTER — Encounter: Payer: Self-pay | Admitting: Sports Medicine

## 2019-06-12 DIAGNOSIS — M25512 Pain in left shoulder: Secondary | ICD-10-CM | POA: Diagnosis not present

## 2019-06-12 NOTE — Assessment & Plan Note (Signed)
[name] returns, he has recurrence of his left anterior shoulder pain, at the last visit for this approximately a year and a month ago it clinically resembled biceps tendinosis. We did a biceps tendon sheath injection and he returned with complete resolution of pain. He has not had any injuries but is noting a recurrence of pain. Repeat biceps tendon sheath injection today, x-rays, MRI, and referral to Dr. Griffin Basil for discussion of biceps tenodesis considering recurrence. At this point he does not desire to do any more injections beyond today, he would like to investigate a surgical cure.

## 2019-06-12 NOTE — Progress Notes (Signed)
    Procedures performed today:    Procedure: Real-time Ultrasound Guided injection of the left proximal biceps sheath Device: Samsung HS60  Verbal informed consent obtained.  Time-out conducted.  Noted no overlying erythema, induration, or other signs of local infection.  Skin prepped in a sterile fashion.  Local anesthesia: Topical Ethyl chloride.  With sterile technique and under real time ultrasound guidance: 1 cc Kenalog 40, 1 cc lidocaine, 1 cc bupivacaine injected easily Completed without difficulty  Pain immediately resolved suggesting accurate placement of the medication.  Advised to call if fevers/chills, erythema, induration, drainage, or persistent bleeding.  Images permanently stored and available for review in the ultrasound unit.  Impression: Technically successful ultrasound guided injection.  Independent interpretation of tests performed by another provider:   None.  Impression and Recommendations:    Acute pain of left shoulder [name] returns, he has recurrence of his left anterior shoulder pain, at the last visit for this approximately a year and a month ago it clinically resembled biceps tendinosis. We did a biceps tendon sheath injection and he returned with complete resolution of pain. He has not had any injuries but is noting a recurrence of pain. Repeat biceps tendon sheath injection today, x-rays, MRI, and referral to Dr. Griffin Basil for discussion of biceps tenodesis considering recurrence. At this point he does not desire to do any more injections beyond today, he would like to investigate a surgical cure.    ___________________________________________ [name]. [name], M.D., ABFM., CAQSM. Primary Care and [place] Instructor of [place] of [facility]

## 2019-06-14 ENCOUNTER — Ambulatory Visit (INDEPENDENT_AMBULATORY_CARE_PROVIDER_SITE_OTHER): Payer: No Typology Code available for payment source

## 2019-06-14 ENCOUNTER — Other Ambulatory Visit: Payer: Self-pay

## 2019-06-14 DIAGNOSIS — M25512 Pain in left shoulder: Secondary | ICD-10-CM

## 2019-06-26 ENCOUNTER — Ambulatory Visit (INDEPENDENT_AMBULATORY_CARE_PROVIDER_SITE_OTHER): Payer: No Typology Code available for payment source | Admitting: Sports Medicine

## 2019-06-26 ENCOUNTER — Encounter: Payer: Self-pay | Admitting: Sports Medicine

## 2019-06-26 ENCOUNTER — Other Ambulatory Visit: Payer: Self-pay

## 2019-06-26 VITALS — BP 123/74 | HR 87 | Ht 75.0 in | Wt 231.0 lb

## 2019-06-26 DIAGNOSIS — N400 Enlarged prostate without lower urinary tract symptoms: Secondary | ICD-10-CM

## 2019-06-26 DIAGNOSIS — Z01818 Encounter for other preprocedural examination: Secondary | ICD-10-CM | POA: Diagnosis not present

## 2019-06-26 DIAGNOSIS — I25700 Atherosclerosis of coronary artery bypass graft(s), unspecified, with unstable angina pectoris: Secondary | ICD-10-CM

## 2019-06-26 DIAGNOSIS — M25512 Pain in left shoulder: Secondary | ICD-10-CM | POA: Diagnosis not present

## 2019-06-26 LAB — POCT GLYCOSYLATED HEMOGLOBIN (HGB A1C): Hemoglobin A1C: 5.6 % (ref 4.0–5.6)

## 2019-06-26 NOTE — Assessment & Plan Note (Signed)
Lorenzo returns, he is going to have a left shoulder arthroscopy, this is intermediate risk noncardiac surgery. He has greater than 4 metabolic levels of cardiac and exercise capacity. He is post quadruple CABG over a decade ago, since then has had no episodes of chest pain. He does take a baby aspirin daily. Hemoglobin A1c is adequately controlled for surgery, he is fully cleared.

## 2019-06-26 NOTE — Progress Notes (Signed)
    Procedures performed today:    ECG was obtained and reviewed by me and shows normal sinus rhythm with possible left atrial enlargement, right bundle branch block and evidence of an old infarct 1  Independent interpretation of tests performed by another provider:   None.  Impression and Recommendations:    Preop examination Bernell returns, he is going to have a left shoulder arthroscopy, this is intermediate risk noncardiac surgery. He has greater than 4 metabolic levels of cardiac and exercise capacity. He is post quadruple CABG over a decade ago, since then has had no episodes of chest pain. He does take a baby aspirin daily. Hemoglobin A1c is adequately controlled for surgery, he is fully cleared.  Acute pain of left shoulder Scheduled for arthroscopy with Dr. Griffin Basil.  CAD (coronary artery disease) of artery bypass graft Overall doing well after CABG in 2009. He had a stress echo in 2012 that was negative for ischemia. He has no symptoms, his ECG was obtained and reviewed by me and shows normal sinus rhythm with possible left atrial enlargement, right bundle branch block and evidence of an old infarct.    ___________________________________________ [name]. [name], M.D., ABFM., CAQSM. Primary Care and [place] Instructor of [place] of [facility]

## 2019-06-26 NOTE — Assessment & Plan Note (Signed)
Overall doing well after CABG in 2009. He had a stress echo in 2012 that was negative for ischemia. He has no symptoms, his ECG was obtained and reviewed by me and shows normal sinus rhythm with possible left atrial enlargement, right bundle branch block and evidence of an old infarct.

## 2019-06-26 NOTE — Assessment & Plan Note (Signed)
Scheduled for arthroscopy with Dr. Griffin Basil.

## 2019-07-03 ENCOUNTER — Other Ambulatory Visit: Payer: Self-pay

## 2019-07-03 ENCOUNTER — Encounter (HOSPITAL_BASED_OUTPATIENT_CLINIC_OR_DEPARTMENT_OTHER): Payer: Self-pay | Admitting: Orthopaedic Surgery

## 2019-07-06 ENCOUNTER — Encounter (HOSPITAL_BASED_OUTPATIENT_CLINIC_OR_DEPARTMENT_OTHER)
Admission: RE | Admit: 2019-07-06 | Discharge: 2019-07-06 | Disposition: A | Discharge status: Home / Self Care | Payer: No Typology Code available for payment source | Source: Ambulatory Visit | Attending: Orthopaedic Surgery | Admitting: Orthopaedic Surgery

## 2019-07-06 ENCOUNTER — Other Ambulatory Visit (HOSPITAL_COMMUNITY)
Admission: RE | Admit: 2019-07-06 | Discharge: 2019-07-06 | Disposition: A | Discharge status: Home / Self Care | Payer: No Typology Code available for payment source | Source: Ambulatory Visit | Attending: Orthopaedic Surgery | Admitting: Orthopaedic Surgery

## 2019-07-06 DIAGNOSIS — I25810 Atherosclerosis of coronary artery bypass graft(s) without angina pectoris: Secondary | ICD-10-CM | POA: Diagnosis not present

## 2019-07-06 DIAGNOSIS — Z20822 Contact with and (suspected) exposure to covid-19: Secondary | ICD-10-CM | POA: Diagnosis not present

## 2019-07-06 DIAGNOSIS — Z01812 Encounter for preprocedural laboratory examination: Secondary | ICD-10-CM | POA: Diagnosis not present

## 2019-07-06 DIAGNOSIS — M7522 Bicipital tendinitis, left shoulder: Secondary | ICD-10-CM | POA: Diagnosis not present

## 2019-07-06 DIAGNOSIS — E782 Mixed hyperlipidemia: Secondary | ICD-10-CM | POA: Diagnosis not present

## 2019-07-06 DIAGNOSIS — G4730 Sleep apnea, unspecified: Secondary | ICD-10-CM | POA: Diagnosis not present

## 2019-07-06 DIAGNOSIS — I252 Old myocardial infarction: Secondary | ICD-10-CM | POA: Diagnosis not present

## 2019-07-06 DIAGNOSIS — S43432A Superior glenoid labrum lesion of left shoulder, initial encounter: Secondary | ICD-10-CM | POA: Diagnosis not present

## 2019-07-06 DIAGNOSIS — M19012 Primary osteoarthritis, left shoulder: Secondary | ICD-10-CM | POA: Diagnosis not present

## 2019-07-06 DIAGNOSIS — Z87891 Personal history of nicotine dependence: Secondary | ICD-10-CM | POA: Diagnosis not present

## 2019-07-06 DIAGNOSIS — Z7982 Long term (current) use of aspirin: Secondary | ICD-10-CM | POA: Diagnosis not present

## 2019-07-06 DIAGNOSIS — Z79899 Other long term (current) drug therapy: Secondary | ICD-10-CM | POA: Diagnosis not present

## 2019-07-06 DIAGNOSIS — M7542 Impingement syndrome of left shoulder: Secondary | ICD-10-CM | POA: Diagnosis present

## 2019-07-06 DIAGNOSIS — X58XXXA Exposure to other specified factors, initial encounter: Secondary | ICD-10-CM | POA: Diagnosis not present

## 2019-07-06 LAB — SARS CORONAVIRUS 2 (TAT 6-24 HRS): SARS Coronavirus 2: NEGATIVE

## 2019-07-06 NOTE — Progress Notes (Signed)

## 2019-07-07 ENCOUNTER — Ambulatory Visit: Payer: No Typology Code available for payment source | Admitting: Sports Medicine

## 2019-07-07 NOTE — H&P (Signed)
PREOPERATIVE H&P  Chief Complaint: LEFT SHOULDER PRIMARY OSTEOARTHRITIS , IMPINGEMENT BICIPITAL TENDINITIS  HPI: [name] is a 54 y.o. male who is scheduled for LEFT SHOULDER ARTHROSCOPY DEBRIDEMENT WITH SUBACROMIAL DECOMPRESSION AND DISTAL CLAVICLE EXCISION BICEPS TENODESIS.   Patient has a past medical history significant for Open heart surgery in 2009, partial colectomy in 2019 for diverticulitis, hyperlipidemia, anemia, and sleep apnea.  Patient is a 54 year-old who Dr. [name] has been treating for left shoulder pain for a couple of years.  He has had injections regularly which have provided him temporary relief.  His most recent injection has already worn off though.  He is struggling with pain and not making much progress at this point.  He is unhappy with his shoulder function.  It bothers him at night, as well as when putting on a jacket or seatbelt.  Right hand dominant at baseline.    His symptoms are rated as moderate to severe, and have been worsening.  This is significantly impairing activities of daily living.    Please see clinic note for further details on this patient's care.    He has elected for surgical management.   Past Medical History:  Diagnosis Date  . Allergy   . Anemia   . CAD (coronary artery disease) of artery bypass graft 2009   Status post bypass grafting 2009 , status post stress echo 2012 no ischemia.  . Depression    past hx after heart surgery   . Dermatitis    Left upper extremity  . Diverticulitis 07/11/2017   Status post percutaneous drainage catheter placement for treatment  . Diverticulosis    Severe in sigmoid colon, Status post percutaneous drainage catheter placement for treatment  . Erectile dysfunction   . History of diverticular abscess 07/11/2017   Status post percutaneous drainage catheter placement for treatment, 07/30/17 drain removed  . History of kidney infection 1990  . Hx of adenomatous colonic polyps  09/02/2014  . Hyperlipidemia, mixed   . Hypotension, unspecified   . Myocardial infarction [facility])    per pt had 2 MI- 2009 both   . Numbness and tingling in left hand    S/p graft for CABG  . Sleep apnea    On 8 cm of water CPAP   Past Surgical History:  Procedure Laterality Date  . COLONOSCOPY  2016  . COLONOSCOPY  2007  . COLONOSCOPY W/ POLYPECTOMY  10/07/2017  . CORONARY ARTERY BYPASS GRAFT  2009   x4  . CYSTOSCOPY W/ RETROGRADES Bilateral 11/27/2017   Procedure: BILATERAL CYSTOSCOPY WITH RETROGRADE PYELOGRAM, BILATERAL URETERAL CATHETERIZATION;  Surgeon: Lucas Mallow, MD;  Location: [facility];  Service: Urology;  Laterality: Bilateral;  . FLEXIBLE SIGMOIDOSCOPY N/A 11/27/2017   Procedure: FLEXIBLE SIGMOIDOSCOPY;  Surgeon: Ileana Roup, MD;  Location: [facility];  Service: General;  Laterality: N/A;  . IR RADIOLOGIST EVAL & MGMT  07/30/2017  . LAPAROSCOPIC SIGMOID COLECTOMY N/A 11/27/2017   Procedure: LAPAROSCOPIC SIGMOIDECTOMY;  Surgeon: Ileana Roup, MD;  Location: [facility];  Service: General;  Laterality: N/A;   Social History   Socioeconomic History  . Marital status: Married    Spouse name: Not on file  . Number of children: Not on file  . Years of education: Not on file  . Highest education level: Not on file  Occupational History  . Occupation: Full time    Employer: [employer]  Tobacco Use  . Smoking status: Former Smoker  Packs/day: 0.20    Years: 5.00    Pack years: 1.00    Types: Cigarettes, Cigars    Start date: 05/28/2000    Quit date: 05/07/2005    Years since quitting: 14.1  . Smokeless tobacco: Former Systems developer    Types: Chew    Quit date: 05/07/1997  . Tobacco comment: chewed tobacco x 20 yrs - quit 2000  Substance and Sexual Activity  . Alcohol use: Yes    Alcohol/week: 15.0 standard drinks    Types: 10 Shots of liquor, 5 Standard drinks or equivalent per week    Comment: beer & liquor  . Drug use: No  . Sexual activity: Yes    Partners:  Female  Other Topics Concern  . Not on file  Social History Narrative  . Not on file   Social Determinants of Health   Financial Resource Strain:   . Difficulty of Paying Living Expenses: Not on file  Food Insecurity:   . Worried About Charity fundraiser in the Last Year: Not on file  . Ran Out of Food in the Last Year: Not on file  Transportation Needs:   . Lack of Transportation (Medical): Not on file  . Lack of Transportation (Non-Medical): Not on file  Physical Activity:   . Days of Exercise per Week: Not on file  . Minutes of Exercise per Session: Not on file  Stress:   . Feeling of Stress : Not on file  Social Connections:   . Frequency of Communication with Friends and Family: Not on file  . Frequency of Social Gatherings with Friends and Family: Not on file  . Attends Religious Services: Not on file  . Active Member of Clubs or Organizations: Not on file  . Attends Archivist Meetings: Not on file  . Marital Status: Not on file   Family History  Problem Relation Age of Onset  . Heart disease Mother   . Heart disease Father   . Colon cancer Paternal Uncle   . Kidney disease Other   . Coronary artery disease Other   . Heart failure Other   . Esophageal cancer Neg Hx   . Prostate cancer Neg Hx   . Rectal cancer Neg Hx   . Stomach cancer Neg Hx   . Colon polyps Neg Hx    Allergies  Allergen Reactions  . Sulfonamide Derivatives Hives, Shortness Of Breath and Rash  . Oxycodone Hives    fever   Prior to Admission medications   Medication Sig Start Date End Date Taking? Authorizing Provider  aspirin 81 MG tablet Take 81 mg by mouth every evening.    Yes [provider]  Calcium Carbonate-Vitamin D (CALCIUM 500/D) 500-125 MG-UNIT TABS Take by mouth.   Yes [provider]  cholecalciferol (VITAMIN D) 1000 units tablet Take 1,000 Units by mouth every evening.   Yes [provider]  ibuprofen (ADVIL,MOTRIN) 200 MG tablet Take  400-600 mg by mouth 3 (three) times daily as needed for headache or moderate pain.   Yes [provider]  Multiple Vitamin (MULTIVITAMIN) tablet Take 1 tablet by mouth every evening.    Yes [provider]  Omega-3 Fatty Acids (FISH OIL) 1200 MG CAPS Take 1,200 mg by mouth every evening.   Yes [provider]  rosuvastatin (CRESTOR) 40 MG tablet Take 1 tablet (40 mg total) by mouth daily. 12/15/18  Yes Branch, Alphonse Guild, MD  clotrimazole-betamethasone (LOTRISONE) cream Apply 1 application topically 2 (two)  times daily. 05/12/19   Silverio Decamp, MD  montelukast (SINGULAIR) 10 MG tablet TAKE 1 TABLET AT BEDTIME 05/02/19   Silverio Decamp, MD    ROS: All other systems have been reviewed and were otherwise negative with the exception of those mentioned in the HPI and as above.  Physical Exam: General: Alert, no acute distress Cardiovascular: No pedal edema Respiratory: No cyanosis, no use of accessory musculature GI: No organomegaly, abdomen is soft and non-tender Skin: No lesions in the area of chief complaint Neurologic: Sensation intact distally Psychiatric: Patient is competent for consent with normal mood and affect Lymphatic: No axillary or cervical lymphadenopathy  MUSCULOSKELETAL:  Left shoulder: active forward elevation to 160, passive to 170.  External rotation of 30 with pain.  Symmetric to the contralateral side.  Cuff strength 4-/5 supraspinatus.  Intact infraspinatus and subscapularis.  Positive AC tenderness to palpation, impingement and O'Brien's.    Imaging: MRI demonstrates moderate tendinosis throughout the cuff.  He has a Type II acromion and AC arthrosis.  He additionally has some fluid around his biceps which corresponds to his bicipital tenderness to palpation.    Assessment: LEFT SHOULDER PRIMARY OSTEOARTHRITIS , IMPINGEMENT BICIPITAL TENDINITIS  Plan: Plan for Procedure(s): LEFT SHOULDER ARTHROSCOPY DEBRIDEMENT WITH  SUBACROMIAL DECOMPRESSION AND DISTAL CLAVICLE EXCISION BICEPS TENODESIS  The risks benefits and alternatives were discussed with the patient including but not limited to the risks of nonoperative treatment, versus surgical intervention including infection, bleeding, nerve injury,  blood clots, cardiopulmonary complications, morbidity, mortality, among others, and they were willing to proceed.   The patient acknowledged the explanation, agreed to proceed with the plan and consent was signed.   Patient has received clearance from his Cardiologist, Dr. Harl Bowie, and his PCP, Dr. Liam Graham.    Operative Plan: Left shoulder scope, SAD, DCE, BT, possible cuff repair Discharge Medications: Tylenol, Celebrex, Oxycodone, Zofran DVT Prophylaxis: None Physical Therapy: Outpatient PT Special Discharge needs: Sling   Ethelda Chick, PA-C  07/07/2019 7:07 AM

## 2019-07-09 ENCOUNTER — Other Ambulatory Visit: Payer: Self-pay

## 2019-07-09 ENCOUNTER — Ambulatory Visit (HOSPITAL_BASED_OUTPATIENT_CLINIC_OR_DEPARTMENT_OTHER): Payer: No Typology Code available for payment source | Admitting: Anesthesiology

## 2019-07-09 ENCOUNTER — Encounter (HOSPITAL_BASED_OUTPATIENT_CLINIC_OR_DEPARTMENT_OTHER): Payer: Self-pay | Admitting: Orthopaedic Surgery

## 2019-07-09 ENCOUNTER — Encounter (HOSPITAL_BASED_OUTPATIENT_CLINIC_OR_DEPARTMENT_OTHER)
Admission: RE | Disposition: A | Discharge status: Home / Self Care | Payer: Self-pay | Source: Home / Self Care | Attending: Orthopaedic Surgery

## 2019-07-09 ENCOUNTER — Ambulatory Visit (HOSPITAL_BASED_OUTPATIENT_CLINIC_OR_DEPARTMENT_OTHER)
Admission: RE | Admit: 2019-07-09 | Discharge: 2019-07-09 | Disposition: A | Discharge status: Home / Self Care | Payer: No Typology Code available for payment source | Attending: Orthopaedic Surgery | Admitting: Orthopaedic Surgery

## 2019-07-09 DIAGNOSIS — Z79899 Other long term (current) drug therapy: Secondary | ICD-10-CM | POA: Insufficient documentation

## 2019-07-09 DIAGNOSIS — Z7982 Long term (current) use of aspirin: Secondary | ICD-10-CM | POA: Insufficient documentation

## 2019-07-09 DIAGNOSIS — I252 Old myocardial infarction: Secondary | ICD-10-CM | POA: Insufficient documentation

## 2019-07-09 DIAGNOSIS — G4730 Sleep apnea, unspecified: Secondary | ICD-10-CM | POA: Insufficient documentation

## 2019-07-09 DIAGNOSIS — M7522 Bicipital tendinitis, left shoulder: Secondary | ICD-10-CM | POA: Insufficient documentation

## 2019-07-09 DIAGNOSIS — S43432A Superior glenoid labrum lesion of left shoulder, initial encounter: Secondary | ICD-10-CM | POA: Insufficient documentation

## 2019-07-09 DIAGNOSIS — Z87891 Personal history of nicotine dependence: Secondary | ICD-10-CM | POA: Insufficient documentation

## 2019-07-09 DIAGNOSIS — E782 Mixed hyperlipidemia: Secondary | ICD-10-CM | POA: Insufficient documentation

## 2019-07-09 DIAGNOSIS — I25810 Atherosclerosis of coronary artery bypass graft(s) without angina pectoris: Secondary | ICD-10-CM | POA: Insufficient documentation

## 2019-07-09 DIAGNOSIS — M7542 Impingement syndrome of left shoulder: Secondary | ICD-10-CM | POA: Insufficient documentation

## 2019-07-09 DIAGNOSIS — M19012 Primary osteoarthritis, left shoulder: Secondary | ICD-10-CM | POA: Insufficient documentation

## 2019-07-09 DIAGNOSIS — X58XXXA Exposure to other specified factors, initial encounter: Secondary | ICD-10-CM | POA: Insufficient documentation

## 2019-07-09 HISTORY — PX: BICEPT TENODESIS: SHX5116

## 2019-07-09 SURGERY — SHOULDER ARTHROSCOPY WITH SUBACROMIAL DECOMPRESSION AND DISTAL CLAVICLE EXCISION
Anesthesia: General | Site: Shoulder | Laterality: Left

## 2019-07-09 MED ORDER — BUPIVACAINE HCL (PF) 0.5 % IJ SOLN
INTRAMUSCULAR | Status: DC | PRN
  Administered 2019-07-09: 15 mL via PERINEURAL

## 2019-07-09 MED ORDER — CEFAZOLIN SODIUM-DEXTROSE 2-4 GM/100ML-% IV SOLN
2.0000 g | INTRAVENOUS | Status: AC
  Administered 2019-07-09: 2 g via INTRAVENOUS

## 2019-07-09 MED ORDER — FENTANYL CITRATE (PF) 100 MCG/2ML IJ SOLN
INTRAMUSCULAR | Status: AC
  Filled 2019-07-09: qty 2

## 2019-07-09 MED ORDER — ROCURONIUM BROMIDE 10 MG/ML (PF) SYRINGE
PREFILLED_SYRINGE | INTRAVENOUS | Status: DC | PRN
  Administered 2019-07-09: 50 mg via INTRAVENOUS

## 2019-07-09 MED ORDER — PROPOFOL 10 MG/ML IV BOLUS
INTRAVENOUS | Status: AC
  Filled 2019-07-09: qty 20

## 2019-07-09 MED ORDER — LIDOCAINE 2% (20 MG/ML) 5 ML SYRINGE
INTRAMUSCULAR | Status: DC | PRN
  Administered 2019-07-09: 40 mg via INTRAVENOUS

## 2019-07-09 MED ORDER — SUGAMMADEX SODIUM 200 MG/2ML IV SOLN
INTRAVENOUS | Status: DC | PRN
  Administered 2019-07-09: 200 mg via INTRAVENOUS

## 2019-07-09 MED ORDER — ONDANSETRON HCL 4 MG PO TABS: 4.0000 mg | ORAL_TABLET | Freq: Three times a day (TID) | ORAL | 1 refills | Status: AC | PRN

## 2019-07-09 MED ORDER — ONDANSETRON HCL 4 MG/2ML IJ SOLN
INTRAMUSCULAR | Status: DC | PRN
  Administered 2019-07-09: 4 mg via INTRAVENOUS

## 2019-07-09 MED ORDER — MELOXICAM 7.5 MG PO TABS: 7.5000 mg | ORAL_TABLET | Freq: Two times a day (BID) | ORAL | 0 refills | Status: AC

## 2019-07-09 MED ORDER — HYDROCODONE-ACETAMINOPHEN 5-325 MG PO TABS: 1.0000 | ORAL_TABLET | Freq: Four times a day (QID) | ORAL | 0 refills | Status: DC | PRN

## 2019-07-09 MED ORDER — CHLORHEXIDINE GLUCONATE 4 % EX LIQD: 60.0000 mL | Freq: Once | CUTANEOUS | Status: DC

## 2019-07-09 MED ORDER — FENTANYL CITRATE (PF) 100 MCG/2ML IJ SOLN: 25.0000 ug | INTRAMUSCULAR | Status: DC | PRN

## 2019-07-09 MED ORDER — PROPOFOL 10 MG/ML IV BOLUS
INTRAVENOUS | Status: DC | PRN
  Administered 2019-07-09: 200 mg via INTRAVENOUS

## 2019-07-09 MED ORDER — ROCURONIUM BROMIDE 10 MG/ML (PF) SYRINGE
PREFILLED_SYRINGE | INTRAVENOUS | Status: AC
  Filled 2019-07-09: qty 20

## 2019-07-09 MED ORDER — PHENYLEPHRINE 40 MCG/ML (10ML) SYRINGE FOR IV PUSH (FOR BLOOD PRESSURE SUPPORT)
PREFILLED_SYRINGE | INTRAVENOUS | Status: AC
  Filled 2019-07-09: qty 10

## 2019-07-09 MED ORDER — PHENYLEPHRINE HCL-NACL 10-0.9 MG/250ML-% IV SOLN
INTRAVENOUS | Status: DC | PRN
  Administered 2019-07-09: 25 ug/min via INTRAVENOUS

## 2019-07-09 MED ORDER — PHENYLEPHRINE 40 MCG/ML (10ML) SYRINGE FOR IV PUSH (FOR BLOOD PRESSURE SUPPORT)
PREFILLED_SYRINGE | INTRAVENOUS | Status: DC | PRN
  Administered 2019-07-09: 80 ug via INTRAVENOUS

## 2019-07-09 MED ORDER — FENTANYL CITRATE (PF) 100 MCG/2ML IJ SOLN
INTRAMUSCULAR | Status: DC | PRN
  Administered 2019-07-09: 100 ug via INTRAVENOUS

## 2019-07-09 MED ORDER — BUPIVACAINE LIPOSOME 1.3 % IJ SUSP
INTRAMUSCULAR | Status: DC | PRN
  Administered 2019-07-09: 10 mL via PERINEURAL

## 2019-07-09 MED ORDER — PHENYLEPHRINE HCL (PRESSORS) 10 MG/ML IV SOLN
INTRAVENOUS | Status: AC
  Filled 2019-07-09: qty 2

## 2019-07-09 MED ORDER — DEXAMETHASONE SODIUM PHOSPHATE 10 MG/ML IJ SOLN
INTRAMUSCULAR | Status: DC | PRN
  Administered 2019-07-09: 10 mg via INTRAVENOUS

## 2019-07-09 MED ORDER — FENTANYL CITRATE (PF) 100 MCG/2ML IJ SOLN
50.0000 ug | INTRAMUSCULAR | Status: DC | PRN
  Administered 2019-07-09: 50 ug via INTRAVENOUS

## 2019-07-09 MED ORDER — CEFAZOLIN SODIUM-DEXTROSE 2-4 GM/100ML-% IV SOLN
INTRAVENOUS | Status: AC
  Filled 2019-07-09: qty 100

## 2019-07-09 MED ORDER — PROMETHAZINE HCL 25 MG/ML IJ SOLN: 6.2500 mg | INTRAMUSCULAR | Status: DC | PRN

## 2019-07-09 MED ORDER — MIDAZOLAM HCL 2 MG/2ML IJ SOLN
INTRAMUSCULAR | Status: AC
  Filled 2019-07-09: qty 2

## 2019-07-09 MED ORDER — SUGAMMADEX SODIUM 500 MG/5ML IV SOLN
INTRAVENOUS | Status: AC
  Filled 2019-07-09: qty 10

## 2019-07-09 MED ORDER — LIDOCAINE 2% (20 MG/ML) 5 ML SYRINGE
INTRAMUSCULAR | Status: AC
  Filled 2019-07-09: qty 10

## 2019-07-09 MED ORDER — MIDAZOLAM HCL 2 MG/2ML IJ SOLN
1.0000 mg | INTRAMUSCULAR | Status: DC | PRN
  Administered 2019-07-09: 2 mg via INTRAVENOUS

## 2019-07-09 MED ORDER — BACLOFEN 10 MG PO TABS: 10.0000 mg | ORAL_TABLET | Freq: Three times a day (TID) | ORAL | 0 refills | Status: DC

## 2019-07-09 MED ORDER — DEXAMETHASONE SODIUM PHOSPHATE 10 MG/ML IJ SOLN
INTRAMUSCULAR | Status: AC
  Filled 2019-07-09: qty 2

## 2019-07-09 MED ORDER — LACTATED RINGERS IV SOLN: INTRAVENOUS | Status: DC

## 2019-07-09 MED ORDER — ONDANSETRON HCL 4 MG/2ML IJ SOLN
INTRAMUSCULAR | Status: AC
  Filled 2019-07-09: qty 4

## 2019-07-09 SURGICAL SUPPLY — 65 items
ANCHOR SUT 1.8 FBRTK KNTLS 2SU (Anchor) ×4 IMPLANT
BLADE EXCALIBUR 4.0X13 (MISCELLANEOUS) ×2 IMPLANT
BURR OVAL 8 FLU 4.0X13 (MISCELLANEOUS) ×2 IMPLANT
CANNULA 5.75X71 LONG (CANNULA) IMPLANT
CANNULA PASSPORT 5 (CANNULA) IMPLANT
CANNULA PASSPORT BUTTON 10-40 (CANNULA) ×2 IMPLANT
CANNULA TWIST IN 8.25X7CM (CANNULA) IMPLANT
CHLORAPREP W/TINT 26 (MISCELLANEOUS) ×2 IMPLANT
CLSR STERI-STRIP ANTIMIC 1/2X4 (GAUZE/BANDAGES/DRESSINGS) IMPLANT
COVER WAND RF STERILE (DRAPES) IMPLANT
DECANTER SPIKE VIAL GLASS SM (MISCELLANEOUS) IMPLANT
DISSECTOR 3.5MM X 13CM CVD (MISCELLANEOUS) IMPLANT
DISSECTOR 4.0MMX13CM CVD (MISCELLANEOUS) IMPLANT
DRAPE IMP U-DRAPE 54X76 (DRAPES) ×2 IMPLANT
DRAPE INCISE IOBAN 66X45 STRL (DRAPES) IMPLANT
DRAPE SHOULDER BEACH CHAIR (DRAPES) ×2 IMPLANT
DRSG PAD ABDOMINAL 8X10 ST (GAUZE/BANDAGES/DRESSINGS) ×2 IMPLANT
DRSG TEGADERM 4X4.75 (GAUZE/BANDAGES/DRESSINGS) ×2 IMPLANT
DW OUTFLOW CASSETTE/TUBE SET (MISCELLANEOUS) ×2 IMPLANT
GAUZE SPONGE 4X4 12PLY STRL (GAUZE/BANDAGES/DRESSINGS) ×2 IMPLANT
GAUZE XEROFORM 1X8 LF (GAUZE/BANDAGES/DRESSINGS) IMPLANT
GLOVE BIO SURGEON STRL SZ 6.5 (GLOVE) ×2 IMPLANT
GLOVE BIOGEL PI IND STRL 6.5 (GLOVE) ×2 IMPLANT
GLOVE BIOGEL PI IND STRL 7.0 (GLOVE) ×2 IMPLANT
GLOVE BIOGEL PI IND STRL 8 (GLOVE) ×1 IMPLANT
GLOVE BIOGEL PI INDICATOR 6.5 (GLOVE) ×2
GLOVE BIOGEL PI INDICATOR 7.0 (GLOVE) ×2
GLOVE BIOGEL PI INDICATOR 8 (GLOVE) ×1
GLOVE ECLIPSE 6.5 STRL STRAW (GLOVE) ×2 IMPLANT
GLOVE ECLIPSE 7.0 STRL STRAW (GLOVE) ×2 IMPLANT
GLOVE ECLIPSE 8.0 STRL XLNG CF (GLOVE) ×2 IMPLANT
GOWN STRL REUS W/ TWL LRG LVL3 (GOWN DISPOSABLE) ×1 IMPLANT
GOWN STRL REUS W/ TWL XL LVL3 (GOWN DISPOSABLE) ×1 IMPLANT
GOWN STRL REUS W/TWL LRG LVL3 (GOWN DISPOSABLE) ×1
GOWN STRL REUS W/TWL XL LVL3 (GOWN DISPOSABLE) ×3 IMPLANT
KIT STABILIZATION SHOULDER (MISCELLANEOUS) ×2 IMPLANT
KIT STR SPEAR 1.8 FBRTK DISP (KITS) ×2 IMPLANT
LASSO 90 CVE QUICKPAS (DISPOSABLE) ×2 IMPLANT
LASSO CRESCENT QUICKPASS (SUTURE) IMPLANT
MANIFOLD NEPTUNE II (INSTRUMENTS) ×2 IMPLANT
NDL SAFETY ECLIPSE 18X1.5 (NEEDLE) ×1 IMPLANT
NEEDLE HYPO 18GX1.5 SHARP (NEEDLE) ×1
NEEDLE SCORPION MULTI FIRE (NEEDLE) IMPLANT
PACK ARTHROSCOPY DSU (CUSTOM PROCEDURE TRAY) ×2 IMPLANT
PACK BASIN DAY SURGERY FS (CUSTOM PROCEDURE TRAY) ×2 IMPLANT
PAD ORTHO SHOULDER 7X19 LRG (SOFTGOODS) IMPLANT
PORT APPOLLO RF 90DEGREE MULTI (SURGICAL WAND) ×2 IMPLANT
RESTRAINT HEAD UNIVERSAL NS (MISCELLANEOUS) ×2 IMPLANT
SHEET MEDIUM DRAPE 40X70 STRL (DRAPES) ×2 IMPLANT
SLEEVE SCD COMPRESS KNEE MED (MISCELLANEOUS) ×2 IMPLANT
SLING ARM FOAM STRAP LRG (SOFTGOODS) IMPLANT
SPONGE LAP 4X18 RFD (DISPOSABLE) IMPLANT
SUT FIBERWIRE #2 38 T-5 BLUE (SUTURE)
SUT MNCRL AB 4-0 PS2 18 (SUTURE) ×2 IMPLANT
SUT PDS AB 1 CT  36 (SUTURE) ×1
SUT PDS AB 1 CT 36 (SUTURE) ×1 IMPLANT
SUT TIGER TAPE 7 IN WHITE (SUTURE) IMPLANT
SUTURE FIBERWR #2 38 T-5 BLUE (SUTURE) IMPLANT
SUTURE TAPE TIGERLINK 1.3MM BL (SUTURE) IMPLANT
SUTURETAPE TIGERLINK 1.3MM BL (SUTURE)
SYR 5ML LL (SYRINGE) ×2 IMPLANT
TAPE FIBER 2MM 7IN #2 BLUE (SUTURE) IMPLANT
TOWEL GREEN STERILE FF (TOWEL DISPOSABLE) ×4 IMPLANT
TUBE CONNECTING 20X1/4 (TUBING) ×2 IMPLANT
TUBING ARTHROSCOPY IRRIG 16FT (MISCELLANEOUS) ×2 IMPLANT

## 2019-07-09 NOTE — Progress Notes (Signed)
Assisted Dr. Fransisco Beau with left, ultrasound guided, interscalene  block. Side rails up, monitors on throughout procedure. See vital signs in flow sheet. Tolerated Procedure well.

## 2019-07-09 NOTE — Transfer of Care (Signed)
Immediate Anesthesia Transfer of Care Note  Patient: [name]  Procedure(s) Performed: LEFT SHOULDER ARTHROSCOPY DEBRIDEMENT WITH SUBACROMIAL DECOMPRESSION AND DISTAL CLAVICLE EXCISION (Left Shoulder) BICEPS TENODESIS (Left Shoulder)  Patient Location: PACU  Anesthesia Type:GA combined with regional for post-op pain  Level of Consciousness: awake, alert  and oriented  Airway & Oxygen Therapy: Patient Spontanous Breathing and Patient connected to nasal cannula oxygen  Post-op Assessment: Report given to RN and Post -op Vital signs reviewed and stable  Post vital signs: Reviewed and stable  Last Vitals:  Vitals Value Taken Time  BP 116/87 07/09/19 1037  Temp    Pulse 70 07/09/19 1041  Resp 15 07/09/19 1041  SpO2 99 % 07/09/19 1041  Vitals shown include unvalidated device data.  Last Pain:  Vitals:   07/09/19 0733  TempSrc: Oral  PainSc: 0-No pain      Patients Stated Pain Goal: 0 ([phone])  Complications: No apparent anesthesia complications

## 2019-07-09 NOTE — Anesthesia Procedure Notes (Signed)
Procedure Name: Intubation Date/Time: 07/09/2019 9:24 AM Performed by: Imagene Riches, CRNA Pre-anesthesia Checklist: Patient identified, Emergency Drugs available, Suction available and Patient being monitored Patient Re-evaluated:Patient Re-evaluated prior to induction Oxygen Delivery Method: Circle System Utilized Preoxygenation: Pre-oxygenation with 100% oxygen Induction Type: IV induction Ventilation: Mask ventilation without difficulty and Oral airway inserted - appropriate to patient size Laryngoscope Size: Miller and 3 Grade View: Grade I Tube type: Oral Tube size: 8.0 mm Number of attempts: 1 Airway Equipment and Method: Stylet and Oral airway Placement Confirmation: ETT inserted through vocal cords under direct vision,  positive ETCO2 and breath sounds checked- equal and bilateral Secured at: 22 cm Tube secured with: Tape Dental Injury: Teeth and Oropharynx as per pre-operative assessment

## 2019-07-09 NOTE — Interval H&P Note (Signed)
History and Physical Interval Note:  07/09/2019 8:34 AM  [name]  has presented today for surgery, with the diagnosis of LEFT SHOULDER PRIMARY OSTEOARTHRITIS , IMPINGEMENT BICIPITAL TENDINITIS.  The various methods of treatment have been discussed with the patient and family. After consideration of risks, benefits and other options for treatment, the patient has consented to  Procedure(s): LEFT SHOULDER ARTHROSCOPY DEBRIDEMENT WITH SUBACROMIAL DECOMPRESSION AND DISTAL CLAVICLE EXCISION (Left) BICEPS TENODESIS (Left) as a surgical intervention.  The patient's history has been reviewed, patient examined, no change in status, stable for surgery.  I have reviewed the patient's chart and labs.  Questions were answered to the patient's satisfaction.     Hiram Gash

## 2019-07-09 NOTE — Anesthesia Preprocedure Evaluation (Addendum)
Anesthesia Evaluation  Patient identified by MRN, date of birth, ID band Patient awake    Reviewed: Allergy & Precautions, NPO status , Patient's Chart, lab work & pertinent test results  History of Anesthesia Complications Negative for: history of anesthetic complications  Airway Mallampati: II  TM Distance: >3 FB Neck ROM: Full    Dental  (+) Dental Advisory Given   Pulmonary sleep apnea , former smoker,    Pulmonary exam normal        Cardiovascular + CAD, + Past MI and + CABG  Normal cardiovascular exam     Neuro/Psych PSYCHIATRIC DISORDERS Depression  left arm numbness/tingling s/p CABG, now resolved     GI/Hepatic negative GI ROS, (+)     substance abuse  alcohol use,   Endo/Other  negative endocrine ROS  Renal/GU negative Renal ROS     Musculoskeletal  (+) Arthritis ,   Abdominal   Peds  Hematology negative hematology ROS (+)   Anesthesia Other Findings Covid neg 07/06/19   Reproductive/Obstetrics                            Anesthesia Physical Anesthesia Plan  ASA: III  Anesthesia Plan: General   Post-op Pain Management:  Regional for Post-op pain   Induction: Intravenous  PONV Risk Score and Plan: 2 and Treatment may vary due to age or medical condition, Ondansetron, Dexamethasone and Midazolam  Airway Management Planned: Oral ETT  Additional Equipment: None  Intra-op Plan:   Post-operative Plan: Extubation in OR  Informed Consent: I have reviewed the patients History and Physical, chart, labs and discussed the procedure including the risks, benefits and alternatives for the proposed anesthesia with the patient or authorized representative who has indicated his/her understanding and acceptance.     Dental advisory given  Plan Discussed with: CRNA and Anesthesiologist  Anesthesia Plan Comments:        Anesthesia Quick Evaluation

## 2019-07-09 NOTE — Anesthesia Procedure Notes (Signed)
Anesthesia Regional Block: Interscalene brachial plexus block   Pre-Anesthetic Checklist: ,, timeout performed, Correct Patient, Correct Site, Correct Laterality, Correct Procedure, Correct Position, site marked, Risks and benefits discussed,  Surgical consent,  Pre-op evaluation,  At surgeon's request and post-op pain management  Laterality: Left  Prep: chloraprep       Needles:  Injection technique: Single-shot  Needle Type: Echogenic Needle     Needle Length: 5cm  Needle Gauge: 21     Additional Needles:   Narrative:  Start time: 07/09/2019 8:29 AM End time: 07/09/2019 8:33 AM Injection made incrementally with aspirations every 5 mL.  Performed by: Personally  Anesthesiologist: Audry Pili, MD  Additional Notes: No pain on injection. No increased resistance to injection. Injection made in 5cc increments. Good needle visualization. Patient tolerated the procedure well.

## 2019-07-09 NOTE — Discharge Instructions (Signed)
Post Anesthesia Home Care Instructions  Activity: Get plenty of rest for the remainder of the day. A responsible individual must stay with you for 24 hours following the procedure.  For the next 24 hours, DO NOT: -Drive a car -Operate machinery -Drink alcoholic beverages -Take any medication unless instructed by your physician -Make any legal decisions or sign important papers.  Meals: Start with liquid foods such as gelatin or soup. Progress to regular foods as tolerated. Avoid greasy, spicy, heavy foods. If nausea and/or vomiting occur, drink only clear liquids until the nausea and/or vomiting subsides. Call your physician if vomiting continues.  Special Instructions/Symptoms: Your throat may feel dry or sore from the anesthesia or the breathing tube placed in your throat during surgery. If this causes discomfort, gargle with warm salt water. The discomfort should disappear within 24 hours.  If you had a scopolamine patch placed behind your ear for the management of post- operative nausea and/or vomiting:  1. The medication in the patch is effective for 72 hours, after which it should be removed.  Wrap patch in a tissue and discard in the trash. Wash hands thoroughly with soap and water. 2. You may remove the patch earlier than 72 hours if you experience unpleasant side effects which may include dry mouth, dizziness or visual disturbances. 3. Avoid touching the patch. Wash your hands with soap and water after contact with the patch.      Regional Anesthesia Blocks  1. Numbness or the inability to move the "blocked" extremity may last from 3-48 hours after placement. The length of time depends on the medication injected and your individual response to the medication. If the numbness is not going away after 48 hours, call your surgeon.  2. The extremity that is blocked will need to be protected until the numbness is gone and the  Strength has returned. Because you cannot feel it, you  will need to take extra care to avoid injury. Because it may be weak, you may have difficulty moving it or using it. You may not know what position it is in without looking at it while the block is in effect.  3. For blocks in the legs and feet, returning to weight bearing and walking needs to be done carefully. You will need to wait until the numbness is entirely gone and the strength has returned. You should be able to move your leg and foot normally before you try and bear weight or walk. You will need someone to be with you when you first try to ensure you do not fall and possibly risk injury.  4. Bruising and tenderness at the needle site are common side effects and will resolve in a few days.  5. Persistent numbness or new problems with movement should be communicated to the surgeon or the [facility] ([phone])/ [facility] ([phone]).  Information for Discharge Teaching: EXPAREL (bupivacaine liposome injectable suspension)   Your surgeon or anesthesiologist gave you EXPAREL(bupivacaine) to help control your pain after surgery.   EXPAREL is a local anesthetic that provides pain relief by numbing the tissue around the surgical site.  EXPAREL is designed to release pain medication over time and can control pain for up to 72 hours.  Depending on how you respond to EXPAREL, you may require less pain medication during your recovery.  Possible side effects:  Temporary loss of sensation or ability to move in the area where bupivacaine was injected.  Nausea, vomiting, constipation  Rarely,   numbness and tingling in your mouth or lips, lightheadedness, or anxiety may occur.  Call your doctor right away if you think you may be experiencing any of these sensations, or if you have other questions regarding possible side effects.  Follow all other discharge instructions given to you by your surgeon or nurse. Eat a healthy diet and drink plenty of water or other  fluids.  If you return to the hospital for any reason within 96 hours following the administration of EXPAREL, it is important for health care providers to know that you have received this anesthetic. A teal colored band has been placed on your arm with the date, time and amount of EXPAREL you have received in order to alert and inform your health care providers. Please leave this armband in place for the full 96 hours following administration, and then you may remove the band. 

## 2019-07-09 NOTE — Op Note (Signed)
Orthopaedic Surgery Operative Note (CSN: [id number])  [name]  01-Apr-1966 Date of Surgery: 07/09/2019   Diagnoses:  Left shoulder impingement, AC arthrosis and biceps tendinitis and SLAP tear  Procedure: Arthroscopic extensive debridement Arthroscopic subacromial decompression Arthroscopic biceps tenodesis Arthroscopic distal clavicle excision   Operative Finding Exam under anesthesia: Full motion no limitation Articular space: No loose bodies, capsule intact, type II Buford complex and labral tear in the superior half of the labrum anteriorly and posteriorly with a SLAP tear as well.  Partial anterior leading edge supraspinatus tear about 10% articular sided.  Debrided back. Chondral surfaces:Intact, no sign of chondral degeneration on the glenoid or humeral head Biceps: SLAP tear Subscapularis: Intact Superior Cuff: As above, 10% leading edge supraspinatus tear debrided back. Bursal side: Intact  Successful completion of the planned procedure.  No issues, good biceps fixation, the cuff was intact enough that no further intervention was necessary.   Post-operative plan: The patient will be non-weightbearing in a sling for weeks with therapy sports.  The patient will be discharged home.  DVT prophylaxis not indicated in ambulatory upper extremity patient without known risk factors.   Pain control with PRN pain medication preferring oral medicines.  Follow up plan will be scheduled in approximately 7 days for incision check and XR.  Post-Op Diagnosis: Same Surgeons:Primary: Hiram Gash, MD Assistants:Caroline McBane PA-C Location: [facility] OR ROOM 6 Anesthesia: General with Exparel interscalene block Antibiotics: Ancef 2 g Tourniquet time: None Estimated Blood Loss: Minimal Complications: None Specimens: None Implants: Implant Name Type Inv. Item Serial No. Manufacturer Lot No. LRB No. Used Action  ANCHOR SUT 1.8 FIBERTAK 2 SUT - [id number] Anchor ANCHOR SUT 1.8 FIBERTAK  2 SUT  [employer] [id number] Left 1 Implanted  ANCHOR SUT 1.8 FIBERTAK 2 SUT - [id number] Anchor ANCHOR SUT 1.8 FIBERTAK 2 SUT  [place] [id number] Left 1 Implanted    Indications for Surgery:   [name] is a 54 y.o. male with continued shoulder pain refractory to nonoperative measures for extended period of time.    The risks and benefits were explained at length including but not limited to continued pain, cuff failure, biceps tenodesis failure, stiffness, need for further surgery and infection.   Procedure:   Patient was correctly identified in the preoperative holding area and operative site marked.  Patient brought to OR and positioned beachchair on an [place] table ensuring that all bony prominences were padded and the head was in an appropriate location.  Anesthesia was induced and the operative shoulder was prepped and draped in the usual sterile fashion.  Timeout was called preincision.  A standard posterior viewing portal was made after localizing the portal with a spinal needle.  An anterior accessory portal was also made.  After clearing the articular space the camera was positioned in the subacromial space.  Findings above.    Extensive debridement was performed of the anterior interval tissue, labral fraying and the bursa.  Subacromial decompression: We made a lateral portal with spinal needle guidance. We then proceeded to debride bursal tissue extensively with a shaver and arthrocare device. At that point we continued to identify the borders of the acromion and identify the spur. We then carefully preserved the deltoid fascia and used a burr to convert the acromion to a Type 1 flat acromion without issue.  Biceps tenodesis: We marked the tendon and then performed a tenotomy and debridement of the stump in the articular space. We then identified the biceps tendon  in its groove suprapec with the arthroscope in the lateral portal taking care to move from lateral to medial to  avoid injury to the subscapularis. At that point we unroofed the tendon itself and mobilized it. An accessory anterior portal was made in line with the tendon and we grasped it from the anterior superior portal and worked from the accessory anterior portal. Two Fibertak 1.61mm knotless anchors were placed in the groove and the tendon was secured in a luggage loop style fashion with a pass of the limb of suture through the tendon using a scorpion device to avoid pull-through.  Repair was completed with good tension on the tendon.  Residual stump of the tendon was removed after being resected with a RF ablator.  Distal Clavicle resection:  The scope was placed in the subacromial space from the posterior portal.  A hemostat was placed through the anterior portal and we spread at the [facility] joint.  A burr was then inserted and 10 mm of distal clavicle was resected taking care to avoid damage to the capsule around the joint and avoiding overhanging bone posteriorly.    The incisions were closed with absorbable monocryl and steri strips.  A sterile dressing was placed along with a sling. The patient was awoken from general anesthesia and taken to the PACU in stable condition without complication.   Nicholas Chapel, PA-C, present and scrubbed throughout the case, critical for completion in a timely fashion, and for retraction, instrumentation, closure.

## 2019-07-09 NOTE — Anesthesia Postprocedure Evaluation (Signed)
Anesthesia Post Note  Patient: YOSNIEL SPRAGG III  Procedure(s) Performed: LEFT SHOULDER ARTHROSCOPY DEBRIDEMENT WITH SUBACROMIAL DECOMPRESSION AND DISTAL CLAVICLE EXCISION (Left Shoulder) BICEPS TENODESIS (Left Shoulder)     Patient location during evaluation: PACU Anesthesia Type: General Level of consciousness: awake and alert Pain management: pain level controlled Vital Signs Assessment: post-procedure vital signs reviewed and stable Respiratory status: spontaneous breathing, nonlabored ventilation and respiratory function stable Cardiovascular status: blood pressure returned to baseline and stable Postop Assessment: no apparent nausea or vomiting Anesthetic complications: no    Last Vitals:  Vitals:   07/09/19 1100 07/09/19 1104  BP: (!) 85/66 111/80  Pulse: 72 74  Resp: 17 16  Temp:    SpO2: 98% 97%    Last Pain:  Vitals:   07/09/19 1100  TempSrc:   PainSc: 0-No pain                 Audry Pili

## 2019-07-21 ENCOUNTER — Ambulatory Visit (INDEPENDENT_AMBULATORY_CARE_PROVIDER_SITE_OTHER): Payer: No Typology Code available for payment source | Admitting: Sports Medicine

## 2019-07-21 ENCOUNTER — Other Ambulatory Visit: Payer: Self-pay

## 2019-07-21 DIAGNOSIS — L249 Irritant contact dermatitis, unspecified cause: Secondary | ICD-10-CM | POA: Diagnosis not present

## 2019-07-21 DIAGNOSIS — L259 Unspecified contact dermatitis, unspecified cause: Secondary | ICD-10-CM | POA: Insufficient documentation

## 2019-07-21 DIAGNOSIS — D72829 Elevated white blood cell count, unspecified: Secondary | ICD-10-CM

## 2019-07-21 MED ORDER — CLOTRIMAZOLE-BETAMETHASONE 1-0.05 % EX CREA: 1.0000 "application " | TOPICAL_CREAM | Freq: Two times a day (BID) | CUTANEOUS | 0 refills | Status: DC

## 2019-07-21 NOTE — Progress Notes (Addendum)
    Procedures performed today:    None.  Independent interpretation of notes and tests performed by another provider:   None.  Impression and Recommendations:    Contact dermatitis [name] is postop left shoulder surgery, and has an irritant contact dermatitis in his left axilla and along his back along the course of the strap for his shoulder immobilizer. He also has similar in his left axilla consistent with intertrigo. Adding topical Lotrisone. Return to see me as needed for this.  Leukocytosis [name] continues to have persistent leukocytosis, at this point I had like an evaluation from hematology/oncology.    ___________________________________________ [name]. [name], M.D., ABFM., CAQSM. Primary Care and [place] Instructor of [place] [employer] [facility]

## 2019-07-21 NOTE — Patient Instructions (Signed)
Contact Dermatitis (back) Dermatitis is redness, soreness, and swelling (inflammation) of the skin. Contact dermatitis is a reaction to certain substances that touch the skin. Many different substances can cause contact dermatitis. There are two types of contact dermatitis:  Irritant contact dermatitis. This type is caused by something that irritates your skin, such as having dry hands from washing them too often with soap. This type does not require previous exposure to the substance for a reaction to occur. This is the most common type.  Allergic contact dermatitis. This type is caused by a substance that you are allergic to, such as poison ivy. This type occurs when you have been exposed to the substance (allergen) and develop a sensitivity to it. Dermatitis may develop soon after your first exposure to the allergen, or it may not develop until the next time you are exposed and every time thereafter. What are the causes? Irritant contact dermatitis is most commonly caused by exposure to:  Makeup.  Soaps.  Detergents.  Bleaches.  Acids.  Metal salts, such as nickel. Allergic contact dermatitis is most commonly caused by exposure to:  Poisonous plants.  Chemicals.  Jewelry.  Latex.  Medicines.  Preservatives in products, such as clothing. What increases the risk? You are more likely to develop this condition if you have:  A job that exposes you to irritants or allergens.  Certain medical conditions, such as asthma or eczema. What are the signs or symptoms? Symptoms of this condition may occur on your body anywhere the irritant has touched you or is touched by you.  Symptoms include: ? Dryness or flaking. ? Redness. ? Cracks. ? Itching. ? Pain or a burning feeling. ? Blisters. ? Drainage of small amounts of blood or clear fluid from skin cracks. With allergic contact dermatitis, there may also be swelling in areas such as the eyelids, mouth, or genitals. How is this  diagnosed? This condition is diagnosed with a medical history and physical exam.  A patch skin test may be performed to help determine the cause.  If the condition is related to your job, you may need to see an occupational medicine specialist. How is this treated? This condition is treated by checking for the cause of the reaction and protecting your skin from further contact. Treatment may also include:  Steroid creams or ointments. Oral steroid medicines may be needed in more severe cases.  Antibiotic medicines or antibacterial ointments, if a skin infection is present.  Antihistamine lotion or an antihistamine taken by mouth to ease itching.  A bandage (dressing). Follow these instructions at home: Skin care  Moisturize your skin as needed.  Apply cool compresses to the affected areas.  Try applying baking soda paste to your skin. Stir water into baking soda until it reaches a paste-like consistency.  Do not scratch your skin, and avoid friction to the affected area.  Avoid the use of soaps, perfumes, and dyes. Medicines  Take or apply over-the-counter and prescription medicines only as told by your health care provider.  If you were prescribed an antibiotic medicine, take or apply the antibiotic as told by your health care provider. Do not stop using the antibiotic even if your condition improves. Bathing  Try taking a bath with: ? Epsom salts. Follow the instructions on the packaging. You can get these at your local pharmacy or grocery store. ? Baking soda. Pour a small amount into the bath as directed by your health care provider. ? Colloidal oatmeal. Follow the instructions on  the packaging. You can get this at your local pharmacy or grocery store.  Bathe less frequently, such as every other day.  Bathe in lukewarm water. Avoid using hot water. Bandage care  If you were given a bandage (dressing), change it as told by your health care provider.  Wash your hands  with soap and water before and after you change your dressing. If soap and water are not available, use hand sanitizer. General instructions  Avoid the substance that caused your reaction. If you do not know what caused it, keep a journal to try to track what caused it. Write down: ? What you eat. ? What cosmetic products you use. ? What you drink. ? What you wear in the affected area. This includes jewelry.  Check the affected areas every day for signs of infection. Check for: ? More redness, swelling, or pain. ? More fluid or blood. ? Warmth. ? Pus or a bad smell.  Keep all follow-up visits as told by your health care provider. This is important. Contact a health care provider if:  Your condition does not improve with treatment.  Your condition gets worse.  You have signs of infection such as swelling, tenderness, redness, soreness, or warmth in the affected area.  You have a fever.  You have new symptoms. Get help right away if:  You have a severe headache, neck pain, or neck stiffness.  You vomit.  You feel very sleepy.  You notice red streaks coming from the affected area.  Your bone or joint underneath the affected area becomes painful after the skin has healed.  The affected area turns darker.  You have difficulty breathing. Summary  Dermatitis is redness, soreness, and swelling (inflammation) of the skin. Contact dermatitis is a reaction to certain substances that touch the skin.  Symptoms of this condition may occur on your body anywhere the irritant has touched you or is touched by you.  This condition is treated by figuring out what caused the reaction and protecting your skin from further contact. Treatment may also include medicines and skin care.  Avoid the substance that caused your reaction. If you do not know what caused it, keep a journal to try to track what caused it.  Contact a health care provider if your condition gets worse or you have signs  of infection such as swelling, tenderness, redness, soreness, or warmth in the affected area. This information is not intended to replace advice given to you by your health care provider. Make sure you discuss any questions you have with your health care provider. Document Revised: 08/13/2018 Document Reviewed: 11/06/2017 Elsevier Patient Education  [place] (axilla) Intertrigo is skin irritation or inflammation (dermatitis) that occurs when folds of skin rub together. The irritation can cause a rash and make skin raw and itchy. This condition most commonly occurs in the skin folds of these areas:  Toes.  Armpits.  Groin.  Under the belly.  Under the breasts.  Buttocks. Intertrigo is not passed from person to person (is not contagious). What are the causes? This condition is caused by heat, moisture, rubbing (friction), and not enough air circulation. The condition can be made worse by:  Sweat.  Bacteria.  A fungus, such as yeast. What increases the risk? This condition is more likely to occur if you have moisture in your skin folds. You are more likely to develop this condition if you:  Have diabetes.  Are overweight.  Are not able  to move around or are not active.  Live in a warm and moist climate.  Wear splints, braces, or other medical devices.  Are not able to control your bowels or bladder (have incontinence). What are the signs or symptoms? Symptoms of this condition include:  A pink or red skin rash in the skin fold or near the skin fold.  Raw or scaly skin.  Itchiness.  A burning feeling.  Bleeding.  Leaking fluid.  A bad smell. How is this diagnosed? This condition is diagnosed with a medical history and physical exam. You may also have a skin swab to test for bacteria or a fungus. How is this treated? This condition may be treated by:  Cleaning and drying your skin.  Taking an antibiotic medicine or using an  antibiotic skin cream for a bacterial infection.  Using an antifungal cream on your skin or taking pills for an infection that was caused by a fungus, such as yeast.  Using a steroid ointment to relieve itchiness and irritation.  Separating the skin fold with a clean cotton cloth to absorb moisture and allow air to flow into the area. Follow these instructions at home:  Keep the affected area clean and dry.  Do not scratch your skin.  Stay in a cool environment as much as possible. Use an air conditioner or fan, if available.  Apply over-the-counter and prescription medicines only as told by your health care provider.  If you were prescribed an antibiotic medicine, use it as told by your health care provider. Do not stop using the antibiotic even if your condition improves.  Keep all follow-up visits as told by your health care provider. This is important. How is this prevented?   Maintain a healthy weight.  Take care of your feet, especially if you have diabetes. Foot care includes: ? Wearing shoes that fit well. ? Keeping your feet dry. ? Wearing clean, breathable socks.  Protect the skin around your groin and buttocks, especially if you have incontinence. Skin protection includes: ? Following a regular cleaning routine. ? Using skin protectant creams, powders, or ointments. ? Changing protection pads frequently.  Do not wear tight clothes. Wear clothes that are loose, absorbent, and made of cotton.  Wear a bra that gives good support, if needed.  Shower and dry yourself well after activity or exercise. Use a hair dryer on a cool setting to dry between skin folds, especially after you bathe.  If you have diabetes, keep your blood sugar under control. Contact a health care provider if:  Your symptoms do not improve with treatment.  Your symptoms get worse or they spread.  You notice increased redness and warmth.  You have a fever. Summary  Intertrigo is skin  irritation or inflammation (dermatitis) that occurs when folds of skin rub together.  This condition is caused by heat, moisture, rubbing (friction), and not enough air circulation.  This condition may be treated by cleaning and drying your skin and with medicines.  Apply over-the-counter and prescription medicines only as told by your health care provider.  Keep all follow-up visits as told by your health care provider. This is important. This information is not intended to replace advice given to you by your health care provider. Make sure you discuss any questions you have with your health care provider. Document Revised: 09/23/2017 Document Reviewed: 09/23/2017 Elsevier Patient Education  [place].

## 2019-07-21 NOTE — Assessment & Plan Note (Signed)
[name] is postop left shoulder surgery, and has an irritant contact dermatitis in his left axilla and along his back along the course of the strap for his shoulder immobilizer. He also has similar in his left axilla consistent with intertrigo. Adding topical Lotrisone. Return to see me as needed for this.

## 2019-08-06 ENCOUNTER — Telehealth: Payer: Self-pay | Admitting: Hematology

## 2019-08-06 LAB — COMPLETE METABOLIC PANEL WITH GFR
AG Ratio: 1.6 (calc) (ref 1.0–2.5)
ALT: 28 U/L (ref 9–46)
AST: 23 U/L (ref 10–35)
Albumin: 4.2 g/dL (ref 3.6–5.1)
Alkaline phosphatase (APISO): 75 U/L (ref 35–144)
BUN: 17 mg/dL (ref 7–25)
CO2: 22 mmol/L (ref 20–32)
Calcium: 9.5 mg/dL (ref 8.6–10.3)
Chloride: 107 mmol/L (ref 98–110)
Creat: 0.78 mg/dL (ref 0.70–1.33)
GFR, Est African American: 118 mL/min/{1.73_m2} (ref >=60)
GFR, Est Non African American: 102 mL/min/{1.73_m2} (ref >=60)
Globulin: 2.7 g/dL (calc) (ref 1.9–3.7)
Glucose, Bld: 112 mg/dL — ABNORMAL HIGH (ref 65–99)
Potassium: 4.3 mmol/L (ref 3.5–5.3)
Sodium: 139 mmol/L (ref 135–146)
Total Bilirubin: 0.4 mg/dL (ref 0.2–1.2)
Total Protein: 6.9 g/dL (ref 6.1–8.1)

## 2019-08-06 LAB — LIPID PANEL W/REFLEX DIRECT LDL
Cholesterol: 94 mg/dL (ref <200)
HDL: 33 mg/dL — ABNORMAL LOW (ref >=40)
LDL Cholesterol (Calc): 46 mg/dL (calc)
Non-HDL Cholesterol (Calc): 61 mg/dL (calc) (ref <130)
Total CHOL/HDL Ratio: 2.8 (calc) (ref <5.0)
Triglycerides: 66 mg/dL (ref <150)

## 2019-08-06 LAB — PSA, TOTAL AND FREE
PSA, % Free: 50 % (calc) (ref >25)
PSA, Free: 0.2 ng/mL
PSA, Total: 0.4 ng/mL (ref <=4.0)

## 2019-08-06 LAB — TSH: TSH: 3.35 mIU/L (ref 0.40–4.50)

## 2019-08-06 LAB — CBC
HCT: 43.3 % (ref 38.5–50.0)
Hemoglobin: 14.4 g/dL (ref 13.2–17.1)
MCH: 28.6 pg (ref 27.0–33.0)
MCHC: 33.3 g/dL (ref 32.0–36.0)
MCV: 85.9 fL (ref 80.0–100.0)
MPV: 9.9 fL (ref 7.5–12.5)
Platelets: 395 10*3/uL (ref 140–400)
RBC: 5.04 10*6/uL (ref 4.20–5.80)
RDW: 12.6 % (ref 11.0–15.0)
WBC: 14.2 10*3/uL — ABNORMAL HIGH (ref 3.8–10.8)

## 2019-08-06 NOTE — Assessment & Plan Note (Signed)
[name] continues to have persistent leukocytosis, at this point I had like an evaluation from hematology/oncology.

## 2019-08-06 NOTE — Telephone Encounter (Signed)
I called and LMVM for patient regarding New patient appointment date/time/location/  Calendar & Letter was sent to patient as well

## 2019-08-06 NOTE — Addendum Note (Signed)
Addended by: Silverio Decamp on: 08/06/2019 11:51 AM   Modules accepted: Orders

## 2019-08-31 ENCOUNTER — Ambulatory Visit: Payer: No Typology Code available for payment source | Admitting: Hematology

## 2019-08-31 ENCOUNTER — Other Ambulatory Visit: Payer: No Typology Code available for payment source

## 2019-09-01 ENCOUNTER — Inpatient Hospital Stay (HOSPITAL_BASED_OUTPATIENT_CLINIC_OR_DEPARTMENT_OTHER): Payer: No Typology Code available for payment source | Admitting: Hematology

## 2019-09-01 ENCOUNTER — Inpatient Hospital Stay: Payer: No Typology Code available for payment source | Attending: Hematology

## 2019-09-01 ENCOUNTER — Other Ambulatory Visit: Payer: Self-pay

## 2019-09-01 ENCOUNTER — Other Ambulatory Visit: Payer: Self-pay | Admitting: Hematology

## 2019-09-01 ENCOUNTER — Encounter: Payer: Self-pay | Admitting: Hematology

## 2019-09-01 ENCOUNTER — Telehealth: Payer: Self-pay | Admitting: Hematology

## 2019-09-01 VITALS — BP 124/93 | HR 85 | Temp 97.1°F | Resp 18 | Ht 75.0 in | Wt 235.0 lb

## 2019-09-01 DIAGNOSIS — Z596 Low income: Secondary | ICD-10-CM

## 2019-09-01 DIAGNOSIS — Z79899 Other long term (current) drug therapy: Secondary | ICD-10-CM | POA: Insufficient documentation

## 2019-09-01 DIAGNOSIS — D72829 Elevated white blood cell count, unspecified: Secondary | ICD-10-CM

## 2019-09-01 DIAGNOSIS — Z882 Allergy status to sulfonamides status: Secondary | ICD-10-CM | POA: Insufficient documentation

## 2019-09-01 DIAGNOSIS — Z9049 Acquired absence of other specified parts of digestive tract: Secondary | ICD-10-CM | POA: Diagnosis not present

## 2019-09-01 DIAGNOSIS — Z86010 Personal history of colonic polyps: Secondary | ICD-10-CM | POA: Diagnosis not present

## 2019-09-01 DIAGNOSIS — G4730 Sleep apnea, unspecified: Secondary | ICD-10-CM | POA: Insufficient documentation

## 2019-09-01 DIAGNOSIS — Z8249 Family history of ischemic heart disease and other diseases of the circulatory system: Secondary | ICD-10-CM | POA: Diagnosis not present

## 2019-09-01 DIAGNOSIS — Z885 Allergy status to narcotic agent status: Secondary | ICD-10-CM | POA: Insufficient documentation

## 2019-09-01 DIAGNOSIS — Z841 Family history of disorders of kidney and ureter: Secondary | ICD-10-CM

## 2019-09-01 DIAGNOSIS — R7989 Other specified abnormal findings of blood chemistry: Secondary | ICD-10-CM

## 2019-09-01 DIAGNOSIS — Z7289 Other problems related to lifestyle: Secondary | ICD-10-CM | POA: Insufficient documentation

## 2019-09-01 DIAGNOSIS — Z87891 Personal history of nicotine dependence: Secondary | ICD-10-CM

## 2019-09-01 DIAGNOSIS — I2510 Atherosclerotic heart disease of native coronary artery without angina pectoris: Secondary | ICD-10-CM | POA: Diagnosis not present

## 2019-09-01 DIAGNOSIS — R21 Rash and other nonspecific skin eruption: Secondary | ICD-10-CM | POA: Diagnosis not present

## 2019-09-01 DIAGNOSIS — Z800 Family history of malignant neoplasm of digestive organs: Secondary | ICD-10-CM

## 2019-09-01 DIAGNOSIS — I252 Old myocardial infarction: Secondary | ICD-10-CM | POA: Diagnosis not present

## 2019-09-01 LAB — CBC WITH DIFFERENTIAL (CANCER CENTER ONLY)
Abs Immature Granulocytes: 0.07 10*3/uL (ref 0.00–0.07)
Basophils Absolute: 0.1 10*3/uL (ref 0.0–0.1)
Basophils Relative: 1 %
Eosinophils Absolute: 0.5 10*3/uL (ref 0.0–0.5)
Eosinophils Relative: 4 %
HCT: 43.8 % (ref 39.0–52.0)
Hemoglobin: 15.1 g/dL (ref 13.0–17.0)
Immature Granulocytes: 1 %
Lymphocytes Relative: 31 %
Lymphs Abs: 3.2 10*3/uL (ref 0.7–4.0)
MCH: 29.6 pg (ref 26.0–34.0)
MCHC: 34.5 g/dL (ref 30.0–36.0)
MCV: 85.9 fL (ref 80.0–100.0)
Monocytes Absolute: 0.8 10*3/uL (ref 0.1–1.0)
Monocytes Relative: 8 %
Neutro Abs: 5.6 10*3/uL (ref 1.7–7.7)
Neutrophils Relative %: 55 %
Platelet Count: 300 10*3/uL (ref 150–400)
RBC: 5.1 MIL/uL (ref 4.22–5.81)
RDW: 12.6 % (ref 11.5–15.5)
WBC Count: 10.3 10*3/uL (ref 4.0–10.5)
nRBC: 0 % (ref 0.0–0.2)

## 2019-09-01 LAB — CMP (CANCER CENTER ONLY)
ALT: 24 U/L (ref 0–44)
AST: 20 U/L (ref 15–41)
Albumin: 4.7 g/dL (ref 3.5–5.0)
Alkaline Phosphatase: 63 U/L (ref 38–126)
Anion gap: 10 (ref 5–15)
BUN: 16 mg/dL (ref 6–20)
CO2: 22 mmol/L (ref 22–32)
Calcium: 10.4 mg/dL — ABNORMAL HIGH (ref 8.9–10.3)
Chloride: 105 mmol/L (ref 98–111)
Creatinine: 0.94 mg/dL (ref 0.61–1.24)
GFR, Est AFR Am: >60 mL/min (ref >60)
GFR, Estimated: >60 mL/min (ref >60)
Glucose, Bld: 104 mg/dL — ABNORMAL HIGH (ref 70–99)
Potassium: 4 mmol/L (ref 3.5–5.1)
Sodium: 137 mmol/L (ref 135–145)
Total Bilirubin: 0.6 mg/dL (ref 0.3–1.2)
Total Protein: 7.5 g/dL (ref 6.5–8.1)

## 2019-09-01 LAB — SAVE SMEAR(SSMR), FOR PROVIDER SLIDE REVIEW

## 2019-09-01 LAB — HEPATITIS B SURFACE ANTIBODY,QUALITATIVE: Hep B S Ab: NONREACTIVE

## 2019-09-01 LAB — LACTATE DEHYDROGENASE: LDH: 189 U/L (ref 98–192)

## 2019-09-01 LAB — HIV ANTIBODY (ROUTINE TESTING W REFLEX): HIV Screen 4th Generation wRfx: NONREACTIVE

## 2019-09-01 LAB — HEPATITIS B SURFACE ANTIGEN: Hepatitis B Surface Ag: NONREACTIVE

## 2019-09-01 NOTE — Telephone Encounter (Signed)
Return as needed per 4/27 los

## 2019-09-01 NOTE — Progress Notes (Signed)
[facility] CONSULT NOTE  Patient Care Team: Silverio Decamp, MD as PCP - General (Family Medicine)  HEME/ONC OVERVIEW: 1. Leukocytosis  -WBC 9-14k since 99991111 with neutrophilic predominance; Hgb and plts normal   ASSESSMENT & PLAN:   Leukocytosis  -I reviewed the patient's records in detail, including PCP clinic notes and lab studies -In summary, patient has had chronic, intermittent, mild leukocytosis with WBC between 9 and 14k since 99991111 with neutrophilic predominance.  Review of the patient's medications show intermittent steroid use (dexamethasone and prednisone).  Patient was referred to hematology for further evaluation. -I reviewed lab results in detail with patient -We discussed some of the common causes of leukocytosis, including inflammation, infection, medications (such as steroids and EtOH), and less commonly, blood disorders such as myeloproliferative neoplasm -WBC 10.3k today, improving -I personally reviewed the patient's peripheral blood smear today.  The red blood cells were of normal morphology.  There was no schistocytosis.  The white blood cells were of normal morphology except occasional atypical lymphocytes, which is non-specific. There were no peripheral circulating blasts. The platelets were of normal size and I verified that there were no platelet clumping.   -I have ordered infectious studies, including HIV, Hep B/C serologies -Given the chronic intermittent leukocytosis with occasional thrombocytosis, I have ordered MPN NGS and BCR/ABL FISH to rule out myeloproliferative neoplasm -Finally, due to the occasional atypical lymphocytes on the peripheral blood smear, I have ordered flow cytometry to rule out monoclonal lymphocytes  -If the above work-up is negative, leukocytosis is most likely reactive in the setting of inflammation secondary to arthritis, sporadic steroids and EtOH use  -See the management of EtOH use below  -I counseled the patient  on some of the concerning symptoms, including but not limited to, unexplained persistent fever, drenching night sweats, unexplained weight loss, progressive lymphadenopathy, or abnormal bleeding/bruising, for which he is instructed to seek further evaluation -I also counseled the patient on the importance of keeping up-to-date with age-appropriate cancer screening, including colonoscopy  EtOH use -Patient reports drinking at least 3-4 days per week, with varying amount of EtOH -I discussed with the patient that increased EtOH use can lead to inflammation, which in turns leads to periodic leukocytosis -I counseled the patient on the importance of moderation, and patient agreed with the plan   Orders Placed This Encounter  Procedures  . Flow Cytometry    Atypical lymphocytes on peripheral blood smear    Standing Status:   Future    Number of Occurrences:   1    Standing Expiration Date:   10/05/2020   The total time spent in the encounter was 45 minutes, including face-to-face time with the patient, review of various tests results, order additional studies/medications, documentation, and coordination of care plan.   All questions were answered. The patient knows to call the clinic with any problems, questions or concerns. No barriers to learning was detected.  Return as needed, pending the work-up above.   Tish Men, MD 4/27/20211:48 PM  CHIEF COMPLAINTS/PURPOSE OF CONSULTATION:  "My left shoulder is still hurting"  HISTORY OF PRESENTING ILLNESS:  [name] 54 y.o. male is here because of chronic, intermittent leukocytosis.  Patient reports that he was diagnosed with diverticulitis in the past 1 to 2 years, for which he had a partial colon resection and received IV antibiotics.  He also recently had left shoulder arthroscopy, and is currently participating in physical therapy.  He reports receiving steroid prescription on a  few occasions, including arthritis in the shoulder and rash  on the foot.  His last prednisone prescription was in 04/2019.  He denies any frequent infections or antibiotic usage.  He denies any constitutional symptoms.  He does not have any history of cancer.  He drinks 3 to 4 days/week with varying amount of alcohol.  He denies tobacco or illicit drug use.  REVIEW OF SYSTEMS:   Constitutional: ( - ) fevers, ( - )  chills , ( - ) night sweats Eyes: ( - ) blurriness of vision, ( - ) double vision, ( - ) watery eyes Ears, nose, mouth, throat, and face: ( - ) mucositis, ( - ) sore throat Respiratory: ( - ) cough, ( - ) dyspnea, ( - ) wheezes Cardiovascular: ( - ) palpitation, ( - ) chest discomfort, ( - ) lower extremity swelling Gastrointestinal:  ( - ) nausea, ( - ) heartburn, ( - ) change in bowel habits Skin: ( - ) abnormal skin rashes Lymphatics: ( - ) new lymphadenopathy, ( - ) easy bruising Neurological: ( - ) numbness, ( - ) tingling, ( - ) new weaknesses Behavioral/Psych: ( - ) mood change, ( - ) new changes  All other systems were reviewed with the patient and are negative.  I have reviewed his chart and materials related to his cancer extensively and collaborated history with the patient. Summary of oncologic history is as follows: Oncology History   No history exists.    MEDICAL HISTORY:  Past Medical History:  Diagnosis Date  . Allergy   . Anemia   . CAD (coronary artery disease) of artery bypass graft 2009   Status post bypass grafting 2009 , status post stress echo 2012 no ischemia.  . Depression    past hx after heart surgery   . Dermatitis    Left upper extremity  . Diverticulitis 07/11/2017   Status post percutaneous drainage catheter placement for treatment  . Diverticulosis    Severe in sigmoid colon, Status post percutaneous drainage catheter placement for treatment  . Erectile dysfunction   . History of diverticular abscess 07/11/2017   Status post percutaneous drainage catheter placement for treatment, 07/30/17 drain  removed  . History of kidney infection 1990  . Hx of adenomatous colonic polyps 09/02/2014  . Hyperlipidemia, mixed   . Hypotension, unspecified   . Myocardial infarction [facility])    per pt had 2 MI- 2009 both   . Numbness and tingling in left hand    S/p graft for CABG  . Sleep apnea    On 8 cm of water CPAP    SURGICAL HISTORY: Past Surgical History:  Procedure Laterality Date  . BICEPT TENODESIS Left 07/09/2019   Procedure: BICEPS TENODESIS;  Surgeon: Hiram Gash, MD;  Location: [facility];  Service: Orthopedics;  Laterality: Left;  . COLONOSCOPY  2016  . COLONOSCOPY  2007  . COLONOSCOPY W/ POLYPECTOMY  10/07/2017  . CORONARY ARTERY BYPASS GRAFT  2009   x4  . CYSTOSCOPY W/ RETROGRADES Bilateral 11/27/2017   Procedure: BILATERAL CYSTOSCOPY WITH RETROGRADE PYELOGRAM, BILATERAL URETERAL CATHETERIZATION;  Surgeon: Lucas Mallow, MD;  Location: [facility];  Service: Urology;  Laterality: Bilateral;  . FLEXIBLE SIGMOIDOSCOPY N/A 11/27/2017   Procedure: FLEXIBLE SIGMOIDOSCOPY;  Surgeon: Ileana Roup, MD;  Location: [facility];  Service: General;  Laterality: N/A;  . IR RADIOLOGIST EVAL & MGMT  07/30/2017  . LAPAROSCOPIC SIGMOID COLECTOMY N/A 11/27/2017   Procedure: LAPAROSCOPIC SIGMOIDECTOMY;  Surgeon: Ileana Roup, MD;  Location: [facility];  Service: General;  Laterality: N/A;    SOCIAL HISTORY: Social History   Socioeconomic History  . Marital status: Married    Spouse name: Not on file  . Number of children: Not on file  . Years of education: Not on file  . Highest education level: Not on file  Occupational History  . Occupation: Full time    Employer: [employer]  Tobacco Use  . Smoking status: Former Smoker    Packs/day: 0.20    Years: 5.00    Pack years: 1.00    Types: Cigarettes, Cigars    Start date: 05/28/2000    Quit date: 05/07/2005    Years since quitting: 14.3  . Smokeless tobacco: Former Systems developer    Types: Chew    Quit date: 05/07/1997  .  Tobacco comment: chewed tobacco x 20 yrs - quit 2000  Substance and Sexual Activity  . Alcohol use: Yes    Alcohol/week: 15.0 standard drinks    Types: 10 Shots of liquor, 5 Standard drinks or equivalent per week    Comment: beer & liquor  . Drug use: No  . Sexual activity: Yes    Partners: Female  Other Topics Concern  . Not on file  Social History Narrative  . Not on file   Social Determinants of Health   Financial Resource Strain:   . Difficulty of Paying Living Expenses:   Food Insecurity:   . Worried About Charity fundraiser in the Last Year:   . Arboriculturist in the Last Year:   Transportation Needs:   . Film/video editor (Medical):   [name] Lack of Transportation (Non-Medical):   Physical Activity:   . Days of Exercise per Week:   . Minutes of Exercise per Session:   Stress:   . Feeling of Stress :   Social Connections:   . Frequency of Communication with Friends and Family:   . Frequency of Social Gatherings with Friends and Family:   . Attends Religious Services:   . Active Member of Clubs or Organizations:   . Attends Archivist Meetings:   [name] Marital Status:   Intimate Partner Violence:   . Fear of Current or Ex-Partner:   . Emotionally Abused:   [name] Physically Abused:   . Sexually Abused:     FAMILY HISTORY: Family History  Problem Relation Age of Onset  . Heart disease Mother   . Heart disease Father   . Colon cancer Paternal Uncle   . Kidney disease Other   . Coronary artery disease Other   . Heart failure Other   . Esophageal cancer Neg Hx   . Prostate cancer Neg Hx   . Rectal cancer Neg Hx   . Stomach cancer Neg Hx   . Colon polyps Neg Hx     ALLERGIES:  is allergic to oxycodone and sulfonamide derivatives.  MEDICATIONS:  Current Outpatient Medications  Medication Sig Dispense Refill  . aspirin 81 MG tablet Take 81 mg by mouth every evening.     . baclofen (LIORESAL) 10 MG tablet Take 1 tablet (10 mg total) by mouth 3 (three)  times daily. As needed for muscle spasm 50 tablet 0  . Calcium Carbonate-Vitamin D (CALCIUM 500/D) 500-125 MG-UNIT TABS Take by mouth.    . cholecalciferol (VITAMIN D) 1000 units tablet Take 1,000 Units by mouth every evening.    . montelukast (SINGULAIR) 10 MG tablet TAKE  1 TABLET AT BEDTIME 90 tablet 3  . Multiple Vitamin (MULTIVITAMIN) tablet Take 1 tablet by mouth every evening.     . Omega-3 Fatty Acids (FISH OIL) 1200 MG CAPS Take 1,200 mg by mouth every evening.    . rosuvastatin (CRESTOR) 40 MG tablet Take 1 tablet (40 mg total) by mouth daily. 90 tablet 3   No current facility-administered medications for this visit.    PHYSICAL EXAMINATION: ECOG PERFORMANCE STATUS: 1 - Symptomatic but completely ambulatory  Vitals:   09/01/19 1222  BP: (!) 124/93  Pulse: 85  Resp: 18  Temp: (!) 97.1 F (36.2 C)  SpO2: 99%   Filed Weights   09/01/19 1222  Weight: 235 lb (106.6 kg)    GENERAL: alert, no distress and comfortable SKIN: skin color, texture, turgor are normal, no rashes or significant lesions EYES: conjunctiva are pink and non-injected, sclera clear OROPHARYNX: no exudate, no erythema; lips, buccal mucosa, and tongue normal  NECK: supple, non-tender LYMPH:  no palpable lymphadenopathy in the cervical LUNGS: clear to auscultation with normal breathing effort HEART: regular rate & rhythm, no murmurs, no lower extremity edema ABDOMEN: soft, non-tender, non-distended, normal bowel sounds Musculoskeletal: no cyanosis of digits and no clubbing  PSYCH: alert & oriented x 3, fluent speech  LABORATORY DATA:  I have reviewed the data as listed Lab Results  Component Value Date   WBC 10.3 09/01/2019   HGB 15.1 09/01/2019   HCT 43.8 09/01/2019   MCV 85.9 09/01/2019   PLT 300 09/01/2019   Lab Results  Component Value Date   NA 137 09/01/2019   K 4.0 09/01/2019   CL 105 09/01/2019   CO2 22 09/01/2019    RADIOGRAPHIC STUDIES: I have personally reviewed the radiological  images as listed and agreed with the findings in the report. No results found.  PATHOLOGY: I have reviewed the pathology reports as documented in the oncologist history.

## 2019-09-02 LAB — FLOW CYTOMETRY

## 2019-09-02 LAB — SURGICAL PATHOLOGY

## 2019-09-11 ENCOUNTER — Other Ambulatory Visit: Payer: Self-pay | Admitting: Hematology

## 2019-09-11 LAB — JAK2 (INCLUDING V617F AND EXON 12), MPL,& CALR W/RFL MPN PANEL (NGS)

## 2019-09-11 LAB — BCR ABL1 FISH (GENPATH)

## 2019-09-23 ENCOUNTER — Ambulatory Visit (INDEPENDENT_AMBULATORY_CARE_PROVIDER_SITE_OTHER): Payer: No Typology Code available for payment source | Admitting: Family Medicine

## 2019-09-23 ENCOUNTER — Encounter: Payer: Self-pay | Admitting: Family Medicine

## 2019-09-23 DIAGNOSIS — S0502XA Injury of conjunctiva and corneal abrasion without foreign body, left eye, initial encounter: Secondary | ICD-10-CM

## 2019-09-23 DIAGNOSIS — S0500XA Injury of conjunctiva and corneal abrasion without foreign body, unspecified eye, initial encounter: Secondary | ICD-10-CM | POA: Insufficient documentation

## 2019-09-23 MED ORDER — ERYTHROMYCIN 5 MG/GM OP OINT: 1.0000 "application " | TOPICAL_OINTMENT | Freq: Two times a day (BID) | OPHTHALMIC | 0 refills | Status: DC

## 2019-09-23 MED ORDER — DICLOFENAC SODIUM 0.1 % OP SOLN: 1.0000 [drp] | Freq: Four times a day (QID) | OPHTHALMIC | 0 refills | Status: DC | PRN

## 2019-09-23 NOTE — Progress Notes (Signed)
[name] - 54 y.o. male MRN [id number]  Date of birth: May 17, 1965  Subjective Chief Complaint  Patient presents with  . Facial Swelling    HPI [name] Hess is a 54 y.o. male here today with complaint of eye swelling and irritation.  Initial onset was this morning.  Has very mild pain.  Denies itching.  Eye is watering quite a bit.  Vision is preserved.  He denies contact lens use.  Thinks he may have scratched eye with CPAP strap but isn't sure.  He has not tried anything at home for treatment.   ROS:  A comprehensive ROS was completed and negative except as noted per HPI  Allergies  Allergen Reactions  . Oxycodone Hives and Other (See Comments)    fever  . Sulfonamide Derivatives Hives, Shortness Of Breath and Rash    Past Medical History:  Diagnosis Date  . Allergy   . Anemia   . CAD (coronary artery disease) of artery bypass graft 2009   Status post bypass grafting 2009 , status post stress echo 2012 no ischemia.  . Depression    past hx after heart surgery   . Dermatitis    Left upper extremity  . Diverticulitis 07/11/2017   Status post percutaneous drainage catheter placement for treatment  . Diverticulosis    Severe in sigmoid colon, Status post percutaneous drainage catheter placement for treatment  . Erectile dysfunction   . History of diverticular abscess 07/11/2017   Status post percutaneous drainage catheter placement for treatment, 07/30/17 drain removed  . History of kidney infection 1990  . Hx of adenomatous colonic polyps 09/02/2014  . Hyperlipidemia, mixed   . Hypotension, unspecified   . Myocardial infarction [facility])    per pt had 2 MI- 2009 both   . Numbness and tingling in left hand    S/p graft for CABG  . Sleep apnea    On 8 cm of water CPAP    Past Surgical History:  Procedure Laterality Date  . BICEPT TENODESIS Left 07/09/2019   Procedure: BICEPS TENODESIS;  Surgeon: Hiram Gash, MD;  Location: [facility];   Service: Orthopedics;  Laterality: Left;  . COLONOSCOPY  2016  . COLONOSCOPY  2007  . COLONOSCOPY W/ POLYPECTOMY  10/07/2017  . CORONARY ARTERY BYPASS GRAFT  2009   x4  . CYSTOSCOPY W/ RETROGRADES Bilateral 11/27/2017   Procedure: BILATERAL CYSTOSCOPY WITH RETROGRADE PYELOGRAM, BILATERAL URETERAL CATHETERIZATION;  Surgeon: Lucas Mallow, MD;  [facility];  Service: Urology;  Laterality: Bilateral;  . FLEXIBLE SIGMOIDOSCOPY N/A 11/27/2017   Procedure: FLEXIBLE SIGMOIDOSCOPY;  Surgeon: Ileana Roup, MD;  Location[facility];  Service: General;  Laterality: N/A;  . IR RADIOLOGIST EVAL & MGMT  07/30/2017  . LAPAROSCOPIC SIGMOID COLECTOMY N/A 11/27/2017   Procedure: LAPAROSCOPIC SIGMOIDECTOMY;  Surgeon: Ileana Roup, MD;  Location[facility];  Service: General;  Laterality: N/A;    Social History   Socioeconomic History  . Marital status: Married    Spouse name: Not on file  . Number of children: Not on file  . Years of education: Not on file  . Highest education level: Not on file  Occupational History  . Occupation: Full time    Employer: [employer]  Tobacco Use  . Smoking status: Former Smoker    Packs/day: 0.20    Years: 5.00    Pack years: 1.00    Types: Cigarettes, Cigars    Start date:  05/28/2000    Quit date: 05/07/2005    Years since quitting: 14.3  . Smokeless tobacco: Former Systems developer    Types: Chew    Quit date: 05/07/1997  . Tobacco comment: chewed tobacco x 20 yrs - quit 2000  Substance and Sexual Activity  . Alcohol use: Yes    Alcohol/week: 15.0 standard drinks    Types: 10 Shots of liquor, 5 Standard drinks or equivalent per week    Comment: beer & liquor  . Drug use: No  . Sexual activity: Yes    Partners: Female  Other Topics Concern  . Not on file  Social History Narrative  . Not on file   Social Determinants of Health   Financial Resource Strain:   . Difficulty of Paying Living Expenses:   Food Insecurity:   . Worried About Paediatric nurse in the Last Year:   . Arboriculturist in the Last Year:   Transportation Needs:   . Film/video editor (Medical):   [name] Lack of Transportation (Non-Medical):   Physical Activity:   . Days of Exercise per Week:   . Minutes of Exercise per Session:   Stress:   . Feeling of Stress :   Social Connections:   . Frequency of Communication with Friends and Family:   . Frequency of Social Gatherings with Friends and Family:   . Attends Religious Services:   . Active Member of Clubs or Organizations:   . Attends Archivist Meetings:   [name] Marital Status:     Family History  Problem Relation Age of Onset  . Heart disease Mother   . Heart disease Father   . Colon cancer Paternal Uncle   . Kidney disease Other   . Coronary artery disease Other   . Heart failure Other   . Esophageal cancer Neg Hx   . Prostate cancer Neg Hx   . Rectal cancer Neg Hx   . Stomach cancer Neg Hx   . Colon polyps Neg Hx     Health Maintenance  Topic Date Due  . COVID-19 Vaccine (1) Never done  . COLONOSCOPY  10/08/2022  . TETANUS/TDAP  05/13/2028  . HIV Screening  Completed  . INFLUENZA VACCINE  Discontinued     ----------------------------------------------------------------------------------------------------------------------------------------------------------------------------------------------------------------- Physical Exam BP 138/77 (BP Location: Right Arm, Patient Position: Sitting, Cuff Size: Large)   Pulse 93   Ht 6' 3.2" (1.91 m)   Wt 240 lb 12.8 oz (109.2 kg)   SpO2 97%   BMI 29.94 kg/m   Physical Exam Constitutional:      Appearance: Normal appearance.  HENT:     Head: Normocephalic and atraumatic.     Mouth/Throat:     Mouth: Mucous membranes are moist.  Eyes:     Comments: Chemosis of lower cornea with erythema.  No foreign body seen.  PERRL EOMI  Mild periorbital edema without erythema or tenderness.   Fluorescein with increased uptake around L  lower quadrant.    Musculoskeletal:     Cervical back: Neck supple.  Neurological:     Mental Status: He is alert.     ------------------------------------------------------------------------------------------------------------------------------------------------------------------------------------------------------------------- Assessment and Plan  Corneal abrasion Start erythromycin ointment BID Diclofenac drops qid as needed for comfort for up to 10 days.  No significant discomfort, I think we can avoid patching for now.  I don't think he needs referral to ophthalmology at this time but discussed if not improving or if continues to worsen, has vision changes, or  increased pain to contact us immediately.      Meds ordered this encounter  Medications  . erythromycin ophthalmic ointment    Sig: Place 1 application into the left eye in the morning and at bedtime.    Dispense:  3.5 g    Refill:  0  . diclofenac (VOLTAREN) 0.1 % ophthalmic solution    Sig: Place 1 drop into the left eye 4 (four) times daily as needed. May use for up to 10 days.    Dispense:  5 mL    Refill:  0    No follow-ups on file.    This visit occurred during the SARS-CoV-2 public health emergency.  Safety protocols were in place, including screening questions prior to the visit, additional usage of staff PPE, and extensive cleaning of exam room while observing appropriate contact time as indicated for disinfecting solutions.

## 2019-09-23 NOTE — Assessment & Plan Note (Signed)
Start erythromycin ointment BID Diclofenac drops qid as needed for comfort for up to 10 days.  No significant discomfort, I think we can avoid patching for now.  I don't think he needs referral to ophthalmology at this time but discussed if not improving or if continues to worsen, has vision changes, or increased pain to contact us immediately.

## 2019-09-23 NOTE — Patient Instructions (Signed)
The [facility] Manual, 7th edition (pp. 14-16). [place]: [employer].">  Corneal Abrasion  A corneal abrasion is a scratch or injury to the clear covering over the front of the eye (cornea). Your cornea forms a clear dome that protects your eye and helps to focus your vision. Your cornea is made up of many layers, but the surface layer is one of the most sensitive tissues in your body. A corneal abrasion can be very painful. If a corneal abrasion is not treated, it can become infected and cause an ulcer. This can lead to scarring. A scarred cornea can affect your vision. Sometimes abrasions come back in the same area, even after the original injury has healed. What are the causes? This condition may be caused by:  A poke in the eye.  A gritty or irritating substance (foreign body) in the eye.  Excessive eye rubbing.  Very dry eyes.  Certain eye infections.  Contact lenses that fit poorly or are worn for a long period of time. You can also injure your cornea when putting contact lenses in your eye or taking them out.  Eye surgery.  Certain cornea problems may increase the chance of a corneal abrasion. Sometimes, the cause is not known. What are the signs or symptoms? Symptoms of this condition include:  Eye pain. The pain may get worse when you open and close your eye or when you move your eye.  A feeling of something stuck in your eye.  Tearing, redness, and sensitivity to light.  Having trouble keeping your eye open, or not being able to keep it open.  Blurred vision.  Headache. How is this diagnosed? You may work with a health care provider who specializes in diseases and conditions of the eye (ophthalmologist). This condition may be diagnosed based on your medical history, symptoms, and an eye exam. Before the eye exam, numbing drops may be put into your eye. You may also have dye put in your eye with a dropper or a small paper strip. The dye makes the abrasion easy  to see when your ophthalmologist examines your eye with a light. Your ophthalmologist may look at your eye through an eye scope (slit lamp). How is this treated? Treatment may vary depending on the cause of your condition, and it may include:  Washing out your eye.  Removing any foreign bodies that are in your eye.  Using antibiotic drops or ointment to treat or prevent an infection.  Using a dilating drop to decrease inflammation and pain.  Using steroid drops or ointment to treat redness, irritation, or inflammation.  Applying a cold, wet cloth (cold compress) or ice pack to ease the pain.  Taking pain medicine by mouth (orally). In some cases, an eye patch or bandage soft contact lens might also be used. An eye patch should not be used if the corneal abrasion was related to contact lens wear as it can increase the chance of infection in these eyes. Follow these instructions at home: Medicines  Use eye drops or ointments as told by your health care provider.  If you were prescribed antibiotic drops or ointment, use them as told by your health care provider. Do not stop using the antibiotic even if you start to feel better.  Take over-the-counter and prescription medicines only as told by your health care provider.  Ask your health care provider if the medicine prescribed to you: ? Requires you to avoid driving or using heavy machinery. ? Can cause constipation.   You may need to take these actions to prevent or treat constipation:  Drink enough fluid to keep your urine pale yellow.  Take over-the-counter or prescription medicines.  Eat foods that are high in fiber, such as beans, whole grains, and fresh fruits and vegetables.  Limit foods that are high in fat and processed sugars, such as fried or sweet foods. Eye patch use  If you have an eye patch, wear it as told by your health care provider. ? Do not drive or use machinery while wearing an eye patch. Your ability to judge  distances will be impaired. ? Follow instructions from your health care provider about when to remove the patch. General instructions  Ask your health care provider whether you can use a cold compress on your eye to relieve pain.  Do not rub or touch your eye. Do not wash out your eye.  Do not wear contact lenses until your health care provider says that this is okay.  Avoid bright light and eye strain.  Keep all follow-up visits as told by your health care provider. This is important for preventing infection and vision loss. Contact a health care provider if:  You continue to have eye pain and other symptoms for more than 2 days.  You have new symptoms, such as worse redness, tearing, or discharge.  You have discharge that makes your eyelids stick together in the morning.  Your eye patch becomes so loose that you can blink your eye.  Symptoms return after the original abrasion has healed. Get help right away if:  You have severe eye pain that does not get better with medicine.  You have vision loss. Summary  A corneal abrasion is a scratch or injury to the clear covering over the front of the eye (cornea).  It is important to get treatment for a corneal abrasion. If this problem is not treated, it can affect your vision.  Use eye drops or ointments as told by your health care provider.  If you have an eye patch, do not drive or use machinery while wearing it. Your ability to judge distances will be impaired.  Let your health care provider know if your symptoms continue for more than 2 days. This information is not intended to replace advice given to you by your health care provider. Make sure you discuss any questions you have with your health care provider. Document Revised: 08/29/2018 Document Reviewed: 08/29/2018 Elsevier Patient Education  2020 Elsevier Inc.  

## 2019-10-07 NOTE — Progress Notes (Signed)
Cardiology Office Note  Date: 10/08/2019   ID: [name], DOB 1966/04/30, MRN [id number]  PCP:  Silverio Decamp, MD  Cardiologist:  Carlyle Dolly, MD Electrophysiologist:  None   Chief Complaint: Follow-up CAD  History of Present Illness: [name] is a 54 y.o. male with a history of CAD with CABG in 2009 four-vessel (LIMA-LAD, left radial to OM, SVG-diagonal, SVG-RCA), HLD, OSA on CPAP. Recent left shoulder arthroscopic surgery.  Last seen by Dr. Harl Bowie 10/07/2018 via telemedicine.  He had no complaints of chest pain, dyspnea on exertion or shortness of breath.  He was compliant with his medications.  Medications are limited due to blood pressures historically low.  He had not been on beta-blockers or ACE inhibitors.  He was compliant with his statin medications with a recent LDL in January 2020 of 81.  He was having issues with his CPAP secondary to OSA.  Saw Dr. Radford Pax on 12/16/2018 for OSA.  His machine had stopped working in March 2020 and not been able to procure a new one since that time.  He'd been having a lot of problems with excessive daytime sleepiness, frequent awakenings during the night, snoring, napping during the day and wanted a new device.  Dr. Radford Pax ordered him a new ResMed CPAP with heated humidity and mask of choice set on 8 cm of water.  Patient states he has been doing well since last visit with no acute illnesses.[name]  He recently had some left shoulder surgery and states he is having some problems post surgery and is going to need a follow-up surgery.  Otherwise from a cardiac standpoint he denies any progressive anginal or exertional symptoms, palpitations or arrhythmias, PND orthopnea, bleeding issues, CVA or TIA-like issues, lower extremity edema, DVT or PE-like symptoms.  States all of his lab work with his primary care physician has been normal.  He had some issues with leukocytosis and was seen by oncology hematology physician recently.   His last WBC count was 10.3 which was improving.   Past Medical History:  Diagnosis Date  . Allergy   . Anemia   . CAD (coronary artery disease) of artery bypass graft 2009   Status post bypass grafting 2009 , status post stress echo 2012 no ischemia.  . Depression    past hx after heart surgery   . Dermatitis    Left upper extremity  . Diverticulitis 07/11/2017   Status post percutaneous drainage catheter placement for treatment  . Diverticulosis    Severe in sigmoid colon, Status post percutaneous drainage catheter placement for treatment  . Erectile dysfunction   . History of diverticular abscess 07/11/2017   Status post percutaneous drainage catheter placement for treatment, 07/30/17 drain removed  . History of kidney infection 1990  . Hx of adenomatous colonic polyps 09/02/2014  . Hyperlipidemia, mixed   . Hypotension, unspecified   . Myocardial infarction [facility])    per pt had 2 MI- 2009 both   . Numbness and tingling in left hand    S/p graft for CABG  . Sleep apnea    On 8 cm of water CPAP    Past Surgical History:  Procedure Laterality Date  . BICEPT TENODESIS Left 07/09/2019   Procedure: BICEPS TENODESIS;  Surgeon: Hiram Gash, MD;  Location: [place];  Service: Orthopedics;  Laterality: Left;  . COLONOSCOPY  2016  . COLONOSCOPY  2007  . COLONOSCOPY W/ POLYPECTOMY  10/07/2017  . CORONARY ARTERY  BYPASS GRAFT  2009   x4  . CYSTOSCOPY W/ RETROGRADES Bilateral 11/27/2017   Procedure: BILATERAL CYSTOSCOPY WITH RETROGRADE PYELOGRAM, BILATERAL URETERAL CATHETERIZATION;  Surgeon: Lucas Mallow, MD;  [facility];  Service: Urology;  Laterality: Bilateral;  . FLEXIBLE SIGMOIDOSCOPY N/A 11/27/2017   Procedure: FLEXIBLE SIGMOIDOSCOPY;  Surgeon: Ileana Roup, MD;  [facility];  Service: General;  Laterality: N/A;  . IR RADIOLOGIST EVAL & MGMT  07/30/2017  . LAPAROSCOPIC SIGMOID COLECTOMY N/A 11/27/2017   Procedure: LAPAROSCOPIC  SIGMOIDECTOMY;  Surgeon: Ileana Roup, MD;  [facility];  Service: General;  Laterality: N/A;    Current Outpatient Medications  Medication Sig Dispense Refill  . aspirin 81 MG tablet Take 81 mg by mouth every evening.     . baclofen (LIORESAL) 10 MG tablet Take 1 tablet (10 mg total) by mouth 3 (three) times daily. As needed for muscle spasm 50 tablet 0  . Calcium Carbonate-Vitamin D (CALCIUM 500/D) 500-125 MG-UNIT TABS Take by mouth.    . cholecalciferol (VITAMIN D) 1000 units tablet Take 1,000 Units by mouth every evening.    . diclofenac (VOLTAREN) 0.1 % ophthalmic solution Place 1 drop into the left eye 4 (four) times daily as needed. May use for up to 10 days. 5 mL 0  . montelukast (SINGULAIR) 10 MG tablet TAKE 1 TABLET AT BEDTIME 90 tablet 3  . Multiple Vitamin (MULTIVITAMIN) tablet Take 1 tablet by mouth every evening.     . Omega-3 Fatty Acids (FISH OIL) 1200 MG CAPS Take 1,200 mg by mouth every evening.    . rosuvastatin (CRESTOR) 40 MG tablet Take 1 tablet (40 mg total) by mouth daily. 90 tablet 3   No current facility-administered medications for this visit.   Allergies:  Oxycodone and Sulfonamide derivatives   Social History: The patient  reports that he quit smoking about 14 years ago. His smoking use included cigarettes and cigars. He started smoking about 19 years ago. He has a 1.00 pack-year smoking history. He quit smokeless tobacco use about 22 years ago.  His smokeless tobacco use included chew. He reports current alcohol use of about 15.0 standard drinks of alcohol per week. He reports that he does not use drugs.   Family History: The patient's family history includes Colon cancer in his paternal uncle; Coronary artery disease in an other family member; Heart disease in his father and mother; Heart failure in an other family member; Kidney disease in an other family member.   ROS:  Please see the history of present illness. Otherwise, complete review of  systems is positive for none.  All other systems are reviewed and negative.   Physical Exam: VS:  BP 136/74   Pulse 80   Ht 6\' 3"  (1.905 m)   Wt 240 lb 3.2 oz (109 kg)   SpO2 97%   BMI 30.02 kg/m , BMI Body mass index is 30.02 kg/m.  Wt Readings from Last 3 Encounters:  10/08/19 240 lb 3.2 oz (109 kg)  09/23/19 240 lb 12.8 oz (109.2 kg)  09/01/19 235 lb (106.6 kg)    General: Patient appears comfortable at rest. Neck: Supple, no elevated JVP or carotid bruits, no thyromegaly. Lungs: Clear to auscultation, nonlabored breathing at rest. Cardiac: Regular rate and rhythm, no S3 or significant systolic murmur, no pericardial rub. Extremities: No pitting edema, distal pulses 2+. Skin: Warm and dry. Musculoskeletal: No kyphosis. Neuropsychiatric: Alert and oriented x3, affect grossly appropriate.  ECG:  EKG on  June 26, 2019 showed normal sinus rhythm rate 83, possible left atrial enlargement, cannot rule out anterior infarct, age undetermined.  Recent Labwork: 08/05/2019: TSH 3.35 09/01/2019: ALT 24; AST 20; BUN 16; Creatinine 0.94; Hemoglobin 15.1; Platelet Count 300; Potassium 4.0; Sodium 137     Component Value Date/Time   CHOL 94 08/05/2019 0809   TRIG 66 08/05/2019 0809   HDL 33 (L) 08/05/2019 0809   CHOLHDL 2.8 08/05/2019 0809   VLDL 34 (H) 09/20/2016 0837   LDLCALC 46 08/05/2019 0809    Other Studies Reviewed Today:  12/2007 ANGIOGRAPHIC DATA:  1. The left main is free of critical disease.  2. The LAD courses to the apex. The LAD has multiple lesions  including a 70% proximal mid and 80% lesion at the takeoff of the  third diagonal. There were then in the distal vessel multiple  lesions including a 90, 70, 50, 90, and 90 in sequence. There is a  70% apical stenosis as well. The first diagonal branch is small-to-  moderate in size with 90% ostial narrowing. Second diagonal is  fairly large in size with 80% proximal and 80% mid narrowing.  Second diagonal  does appear to be graftable. The third diagonal  has 90% ostial narrowing.  3. The circumflex has a tiny second marginal with 90% narrowing that  was totally occluded and it started filling late by collaterals.  4. The right coronary artery is totally occluded proximally and fills  in by left-to-right collaterals.  5. Ventriculography in the RAO projection reveals estimated ejection  fraction of 45-50%. There is inferior wall motion abnormality in  the mid inferior wall corresponding the circumflex territory and  anteroapical abnormality, that is mild.  CONCLUSIONS:  1. Total occlusion of the circumflex and right coronary arteries.  2. Minimum 8 successive lesions in the left anterior descending  artery, and including high-grade disease of the large second  diagonal branch as noted above.  3. Progressive symptoms compatible with angina pectoris.  RECOMMENDATIONS: The LAD is not ideal for grafting. Nonetheless, the  apical vessel possibly could be grafted as well as the large diagonal.  In addition, the marginal and the RCA also appear to be optimal for  grafting. Surgical consultation will be obtained.  Assessment and Plan:  1. CAD in native artery   2. Mixed hyperlipidemia   3. Obstructive sleep apnea syndrome    1. CAD in native artery He denies any recent progressive anginal or exertional symptoms.  Previous four-vessel CABG 2009.  Continue aspirin 81 mg.  2. Mixed hyperlipidemia Recent lipid panel on 08/05/2019 showed total cholesterol 94, HDL 33, triglycerides 66, LDL 46.  Continue Crestor 40 mg daily, omega-3 fatty acid 1200 mg capsules daily  3. Obstructive sleep apnea syndrome Patient had a recent visit with Dr. Radford Pax related to obstructive sleep apnea and CPAP.  He received a new ResMed CPAP and is doing well with current CPAP machine.  Medication Adjustments/Labs and Tests Ordered: Current medicines are reviewed at length with the patient today.   Concerns regarding medicines are outlined above.   Disposition: Follow-up with Dr. Harl Bowie or APP 1 year  Signed, Levell July, NP 10/08/2019 9:46 AM    [facility] [place], [place], [place] [address] Phone: [phone]; Fax: [phone]

## 2019-10-08 ENCOUNTER — Encounter: Payer: Self-pay | Admitting: Family Medicine

## 2019-10-08 ENCOUNTER — Ambulatory Visit (INDEPENDENT_AMBULATORY_CARE_PROVIDER_SITE_OTHER): Payer: No Typology Code available for payment source | Admitting: Family Medicine

## 2019-10-08 ENCOUNTER — Other Ambulatory Visit: Payer: Self-pay

## 2019-10-08 VITALS — BP 136/74 | HR 80 | Ht 75.0 in | Wt 240.2 lb

## 2019-10-08 DIAGNOSIS — E782 Mixed hyperlipidemia: Secondary | ICD-10-CM

## 2019-10-08 DIAGNOSIS — I2510 Atherosclerotic heart disease of native coronary artery without angina pectoris: Secondary | ICD-10-CM | POA: Diagnosis not present

## 2019-10-08 DIAGNOSIS — G4733 Obstructive sleep apnea (adult) (pediatric): Secondary | ICD-10-CM | POA: Diagnosis not present

## 2019-10-08 NOTE — Patient Instructions (Addendum)

## 2019-10-27 ENCOUNTER — Encounter: Payer: Self-pay | Admitting: Hematology

## 2019-10-27 ENCOUNTER — Other Ambulatory Visit: Payer: Self-pay | Admitting: Cardiology

## 2019-11-12 ENCOUNTER — Other Ambulatory Visit: Payer: Self-pay

## 2019-11-12 ENCOUNTER — Encounter (HOSPITAL_BASED_OUTPATIENT_CLINIC_OR_DEPARTMENT_OTHER): Payer: Self-pay | Admitting: Orthopaedic Surgery

## 2019-11-16 ENCOUNTER — Other Ambulatory Visit (HOSPITAL_COMMUNITY)
Admission: RE | Admit: 2019-11-16 | Discharge: 2019-11-16 | Disposition: A | Discharge status: Home / Self Care | Payer: No Typology Code available for payment source | Source: Ambulatory Visit | Attending: Orthopaedic Surgery | Admitting: Orthopaedic Surgery

## 2019-11-16 DIAGNOSIS — Z20822 Contact with and (suspected) exposure to covid-19: Secondary | ICD-10-CM | POA: Insufficient documentation

## 2019-11-16 DIAGNOSIS — Z01812 Encounter for preprocedural laboratory examination: Secondary | ICD-10-CM | POA: Diagnosis not present

## 2019-11-16 LAB — SARS CORONAVIRUS 2 (TAT 6-24 HRS): SARS Coronavirus 2: NEGATIVE

## 2019-11-16 NOTE — H&P (Signed)
PREOPERATIVE H&P  Chief Complaint: LEFT SHOULDER ANKYLOSIS AND CARTILAGE DISORDERS  HPI: [name] is a 54 y.o. male who is scheduled for ARTHROSCOPY LEFT SHOULDER DEBRIDEMENT WITH LYSIS RESECT ADHESION WITH MANIPULATION.   Patient has a past medical history significant for Open heart surgery in 2009, partial colectomy in 2019 for diverticulitis, hyperlipidemia, anemia, and sleep apnea..   Patient had a left shoulder scope with biceps tenodesis, subacromial decompression, and distal clavicle excision on 07/09/2019. He has been battling with stiffness recently.  He is still having some trouble with his shoulder.  He has not made much progress since the injection.     His symptoms are rated as moderate to severe, and have been worsening.  This is significantly impairing activities of daily living.    Please see clinic note for further details on this patient's care.    He has elected for surgical management.   Past Medical History:  Diagnosis Date  . Allergy   . Anemia   . CAD (coronary artery disease) of artery bypass graft 2009   Status post bypass grafting 2009 , status post stress echo 2012 no ischemia.  . Depression    past hx after heart surgery   . Dermatitis    Left upper extremity  . Diverticulitis 07/11/2017   Status post percutaneous drainage catheter placement for treatment  . Diverticulosis    Severe in sigmoid colon, Status post percutaneous drainage catheter placement for treatment  . Erectile dysfunction   . History of diverticular abscess 07/11/2017   Status post percutaneous drainage catheter placement for treatment, 07/30/17 drain removed  . History of kidney infection 1990  . Hx of adenomatous colonic polyps 09/02/2014  . Hyperlipidemia, mixed   . Hypotension, unspecified   . Myocardial infarction [facility])    per pt had 2 MI- 2009 both   . Numbness and tingling in left hand    S/p graft for CABG  . Sleep apnea    On 8 cm of water CPAP   Past  Surgical History:  Procedure Laterality Date  . BICEPT TENODESIS Left 07/09/2019   Procedure: BICEPS TENODESIS;  Surgeon: Hiram Gash, MD;  Location: [place];  Service: Orthopedics;  Laterality: Left;  . COLONOSCOPY  2016  . COLONOSCOPY  2007  . COLONOSCOPY W/ POLYPECTOMY  10/07/2017  . CORONARY ARTERY BYPASS GRAFT  2009   x4  . CYSTOSCOPY W/ RETROGRADES Bilateral 11/27/2017   Procedure: BILATERAL CYSTOSCOPY WITH RETROGRADE PYELOGRAM, BILATERAL URETERAL CATHETERIZATION;  Surgeon: Lucas Mallow, MD;  [facility];  Service: Urology;  Laterality: Bilateral;  . FLEXIBLE SIGMOIDOSCOPY N/A 11/27/2017   Procedure: FLEXIBLE SIGMOIDOSCOPY;  Surgeon: Ileana Roup, MD;  [facility];  Service: General;  Laterality: N/A;  . IR RADIOLOGIST EVAL & MGMT  07/30/2017  . LAPAROSCOPIC SIGMOID COLECTOMY N/A 11/27/2017   Procedure: LAPAROSCOPIC SIGMOIDECTOMY;  Surgeon: Ileana Roup, MD;  [facility];  Service: General;  Laterality: N/A;   Social History   Socioeconomic History  . Marital status: Married    Spouse name: Not on file  . Number of children: Not on file  . Years of education: Not on file  . Highest education level: Not on file  Occupational History  . Occupation: Full time    Employer: [employer]  Tobacco Use  . Smoking status: Former Smoker    Packs/day: 0.20    Years: 5.00    Pack years: 1.00  Types: Cigarettes, Cigars    Start date: 05/28/2000    Quit date: 05/07/2005    Years since quitting: 14.5  . Smokeless tobacco: Former Systems developer    Types: Chew    Quit date: 05/07/1997  . Tobacco comment: chewed tobacco x 20 yrs - quit 2000  Vaping Use  . Vaping Use: Never used  Substance and Sexual Activity  . Alcohol use: Yes    Alcohol/week: 15.0 standard drinks    Types: 10 Shots of liquor, 5 Standard drinks or equivalent per week    Comment: beer & liquor  . Drug use: No  . Sexual activity: Yes    Partners: Female  Other Topics  Concern  . Not on file  Social History Narrative  . Not on file   Social Determinants of Health   Financial Resource Strain:   . Difficulty of Paying Living Expenses:   Food Insecurity:   . Worried About Charity fundraiser in the Last Year:   . Arboriculturist in the Last Year:   Transportation Needs:   . Film/video editor (Medical):   [name] Lack of Transportation (Non-Medical):   Physical Activity:   . Days of Exercise per Week:   . Minutes of Exercise per Session:   Stress:   . Feeling of Stress :   Social Connections:   . Frequency of Communication with Friends and Family:   . Frequency of Social Gatherings with Friends and Family:   . Attends Religious Services:   . Active Member of Clubs or Organizations:   . Attends Archivist Meetings:   [name] Marital Status:    Family History  Problem Relation Age of Onset  . Heart disease Mother   . Heart disease Father   . Colon cancer Paternal Uncle   . Kidney disease Other   . Coronary artery disease Other   . Heart failure Other   . Esophageal cancer Neg Hx   . Prostate cancer Neg Hx   . Rectal cancer Neg Hx   . Stomach cancer Neg Hx   . Colon polyps Neg Hx    Allergies  Allergen Reactions  . Oxycodone Hives and Other (See Comments)    fever  . Sulfonamide Derivatives Hives, Shortness Of Breath and Rash   Prior to Admission medications   Medication Sig Start Date End Date Taking? Authorizing Provider  aspirin 81 MG tablet Take 81 mg by mouth every evening.    Yes [provider]  Calcium Carbonate-Vitamin D (CALCIUM 500/D) 500-125 MG-UNIT TABS Take by mouth.   Yes [provider]  cholecalciferol (VITAMIN D) 1000 units tablet Take 1,000 Units by mouth every evening.   Yes [provider]  HYDROcodone-acetaminophen (NORCO/VICODIN) 5-325 MG tablet Take 1 tablet by mouth every 6 (six) hours as needed for moderate pain.   Yes [provider]  montelukast (SINGULAIR) 10 MG  tablet TAKE 1 TABLET AT BEDTIME 05/02/19  Yes Silverio Decamp, MD  Multiple Vitamin (MULTIVITAMIN) tablet Take 1 tablet by mouth every evening.    Yes [provider]  Omega-3 Fatty Acids (FISH OIL) 1200 MG CAPS Take 1,200 mg by mouth every evening.   Yes [provider]  rosuvastatin (CRESTOR) 40 MG tablet TAKE 1 TABLET DAILY 10/28/19  Yes Branch, Alphonse Guild, MD  baclofen (LIORESAL) 10 MG tablet Take 1 tablet (10 mg total) by mouth 3 (three) times daily. As needed for muscle spasm 07/09/19   Ethelda Chick, PA-C  diclofenac (VOLTAREN) 0.1 % ophthalmic solution Place 1 drop into the left eye 4 (four) times daily as needed. May use for up to 10 days. 09/23/19   Luetta Nutting, DO    ROS: All other systems have been reviewed and were otherwise negative with the exception of those mentioned in the HPI and as above.  Physical Exam: General: Alert, no acute distress Cardiovascular: No pedal edema Respiratory: No cyanosis, no use of accessory musculature GI: No organomegaly, abdomen is soft and non-tender Skin: No lesions in the area of chief complaint Neurologic: Sensation intact distally Psychiatric: Patient is competent for consent with normal mood and affect Lymphatic: No axillary or cervical lymphadenopathy  MUSCULOSKELETAL:  Left shoulder: Active forward elevation to 90, passive same with a hard stop.    Imaging: No recent imaging  Assessment: LEFT SHOULDER ANKYLOSIS AND CARTILAGE DISORDERS  Plan: Plan for Procedure(s): ARTHROSCOPY LEFT SHOULDER DEBRIDEMENT WITH LYSIS RESECT ADHESION WITH MANIPULATION  The risks benefits and alternatives were discussed with the patient including but not limited to the risks of nonoperative treatment, versus surgical intervention including infection, bleeding, nerve injury,  blood clots, cardiopulmonary complications, morbidity, mortality, among others, and they were willing to proceed.   The patient acknowledged the  explanation, agreed to proceed with the plan and consent was signed.   Operative Plan: Left shoulder scope with lysis of adhesions and manipulation under anesthesia Discharge Medications: Tylenol, Celebrex, Oxycodone, Zofran DVT Prophylaxis: None Physical Therapy: Outpatient PT (starting ASAP) Special Discharge needs: Sling until block wears off   Ethelda Chick, PA-C  11/16/2019 4:24 PM

## 2019-11-16 NOTE — Progress Notes (Signed)

## 2019-11-19 ENCOUNTER — Ambulatory Visit (HOSPITAL_BASED_OUTPATIENT_CLINIC_OR_DEPARTMENT_OTHER): Payer: No Typology Code available for payment source | Admitting: Anesthesiology

## 2019-11-19 ENCOUNTER — Encounter (HOSPITAL_BASED_OUTPATIENT_CLINIC_OR_DEPARTMENT_OTHER): Payer: Self-pay | Admitting: Orthopaedic Surgery

## 2019-11-19 ENCOUNTER — Ambulatory Visit (HOSPITAL_BASED_OUTPATIENT_CLINIC_OR_DEPARTMENT_OTHER)
Admission: RE | Admit: 2019-11-19 | Discharge: 2019-11-19 | Disposition: A | Discharge status: Home / Self Care | Payer: No Typology Code available for payment source | Attending: Orthopaedic Surgery | Admitting: Orthopaedic Surgery

## 2019-11-19 ENCOUNTER — Encounter (HOSPITAL_BASED_OUTPATIENT_CLINIC_OR_DEPARTMENT_OTHER)
Admission: RE | Disposition: A | Discharge status: Home / Self Care | Payer: Self-pay | Source: Home / Self Care | Attending: Orthopaedic Surgery

## 2019-11-19 ENCOUNTER — Other Ambulatory Visit: Payer: Self-pay

## 2019-11-19 DIAGNOSIS — F329 Major depressive disorder, single episode, unspecified: Secondary | ICD-10-CM | POA: Insufficient documentation

## 2019-11-19 DIAGNOSIS — Z841 Family history of disorders of kidney and ureter: Secondary | ICD-10-CM | POA: Insufficient documentation

## 2019-11-19 DIAGNOSIS — G709 Myoneural disorder, unspecified: Secondary | ICD-10-CM | POA: Diagnosis not present

## 2019-11-19 DIAGNOSIS — Z8249 Family history of ischemic heart disease and other diseases of the circulatory system: Secondary | ICD-10-CM | POA: Insufficient documentation

## 2019-11-19 DIAGNOSIS — Z7982 Long term (current) use of aspirin: Secondary | ICD-10-CM | POA: Insufficient documentation

## 2019-11-19 DIAGNOSIS — Z6830 Body mass index (BMI) 30.0-30.9, adult: Secondary | ICD-10-CM | POA: Insufficient documentation

## 2019-11-19 DIAGNOSIS — Z9049 Acquired absence of other specified parts of digestive tract: Secondary | ICD-10-CM | POA: Diagnosis not present

## 2019-11-19 DIAGNOSIS — R200 Anesthesia of skin: Secondary | ICD-10-CM | POA: Insufficient documentation

## 2019-11-19 DIAGNOSIS — I2510 Atherosclerotic heart disease of native coronary artery without angina pectoris: Secondary | ICD-10-CM | POA: Insufficient documentation

## 2019-11-19 DIAGNOSIS — Z800 Family history of malignant neoplasm of digestive organs: Secondary | ICD-10-CM | POA: Diagnosis not present

## 2019-11-19 DIAGNOSIS — Z87891 Personal history of nicotine dependence: Secondary | ICD-10-CM | POA: Insufficient documentation

## 2019-11-19 DIAGNOSIS — E782 Mixed hyperlipidemia: Secondary | ICD-10-CM | POA: Insufficient documentation

## 2019-11-19 DIAGNOSIS — M1990 Unspecified osteoarthritis, unspecified site: Secondary | ICD-10-CM | POA: Diagnosis not present

## 2019-11-19 DIAGNOSIS — Z79899 Other long term (current) drug therapy: Secondary | ICD-10-CM | POA: Diagnosis not present

## 2019-11-19 DIAGNOSIS — I252 Old myocardial infarction: Secondary | ICD-10-CM | POA: Insufficient documentation

## 2019-11-19 DIAGNOSIS — E669 Obesity, unspecified: Secondary | ICD-10-CM | POA: Diagnosis not present

## 2019-11-19 DIAGNOSIS — Z885 Allergy status to narcotic agent status: Secondary | ICD-10-CM | POA: Insufficient documentation

## 2019-11-19 DIAGNOSIS — Z882 Allergy status to sulfonamides status: Secondary | ICD-10-CM | POA: Diagnosis not present

## 2019-11-19 DIAGNOSIS — Z791 Long term (current) use of non-steroidal anti-inflammatories (NSAID): Secondary | ICD-10-CM | POA: Insufficient documentation

## 2019-11-19 DIAGNOSIS — Z86010 Personal history of colonic polyps: Secondary | ICD-10-CM | POA: Diagnosis not present

## 2019-11-19 DIAGNOSIS — I10 Essential (primary) hypertension: Secondary | ICD-10-CM | POA: Insufficient documentation

## 2019-11-19 DIAGNOSIS — G4730 Sleep apnea, unspecified: Secondary | ICD-10-CM | POA: Diagnosis not present

## 2019-11-19 DIAGNOSIS — M24612 Ankylosis, left shoulder: Secondary | ICD-10-CM | POA: Insufficient documentation

## 2019-11-19 DIAGNOSIS — I25810 Atherosclerosis of coronary artery bypass graft(s) without angina pectoris: Secondary | ICD-10-CM | POA: Diagnosis not present

## 2019-11-19 HISTORY — PX: SHOULDER ARTHROSCOPY: SHX128

## 2019-11-19 SURGERY — ARTHROSCOPY, SHOULDER
Anesthesia: General | Site: Shoulder | Laterality: Left

## 2019-11-19 MED ORDER — PROPOFOL 10 MG/ML IV BOLUS
INTRAVENOUS | Status: DC | PRN
  Administered 2019-11-19: 180 mg via INTRAVENOUS

## 2019-11-19 MED ORDER — ONDANSETRON HCL 4 MG PO TABS: 4.0000 mg | ORAL_TABLET | Freq: Three times a day (TID) | ORAL | 1 refills | Status: AC | PRN

## 2019-11-19 MED ORDER — LIDOCAINE 2% (20 MG/ML) 5 ML SYRINGE
INTRAMUSCULAR | Status: AC
  Filled 2019-11-19: qty 5

## 2019-11-19 MED ORDER — FENTANYL CITRATE (PF) 100 MCG/2ML IJ SOLN
INTRAMUSCULAR | Status: AC
  Filled 2019-11-19: qty 2

## 2019-11-19 MED ORDER — BUPIVACAINE HCL (PF) 0.25 % IJ SOLN
INTRAMUSCULAR | Status: AC
  Filled 2019-11-19: qty 30

## 2019-11-19 MED ORDER — BUPIVACAINE HCL (PF) 0.5 % IJ SOLN
INTRAMUSCULAR | Status: DC | PRN
Start: 2019-11-19 — End: 2019-11-19
  Administered 2019-11-19: 15 mL via PERINEURAL

## 2019-11-19 MED ORDER — CEFAZOLIN SODIUM-DEXTROSE 2-4 GM/100ML-% IV SOLN
2.0000 g | INTRAVENOUS | Status: AC
  Administered 2019-11-19: 2 g via INTRAVENOUS

## 2019-11-19 MED ORDER — LIDOCAINE 2% (20 MG/ML) 5 ML SYRINGE
INTRAMUSCULAR | Status: DC | PRN
  Administered 2019-11-19: 60 mg via INTRAVENOUS

## 2019-11-19 MED ORDER — FENTANYL CITRATE (PF) 100 MCG/2ML IJ SOLN
50.0000 ug | Freq: Once | INTRAMUSCULAR | Status: AC
  Administered 2019-11-19: 50 ug via INTRAVENOUS

## 2019-11-19 MED ORDER — PROMETHAZINE HCL 25 MG/ML IJ SOLN: 6.2500 mg | INTRAMUSCULAR | Status: DC | PRN

## 2019-11-19 MED ORDER — PROPOFOL 10 MG/ML IV BOLUS
INTRAVENOUS | Status: AC
  Filled 2019-11-19: qty 20

## 2019-11-19 MED ORDER — DEXAMETHASONE SODIUM PHOSPHATE 10 MG/ML IJ SOLN
INTRAMUSCULAR | Status: DC | PRN
  Administered 2019-11-19: 5 mg via INTRAVENOUS

## 2019-11-19 MED ORDER — SODIUM CHLORIDE 0.9 % IR SOLN: Status: DC | PRN

## 2019-11-19 MED ORDER — BUPIVACAINE LIPOSOME 1.3 % IJ SUSP
INTRAMUSCULAR | Status: DC | PRN
  Administered 2019-11-19: 10 mL via PERINEURAL

## 2019-11-19 MED ORDER — LACTATED RINGERS IV SOLN: INTRAVENOUS | Status: DC

## 2019-11-19 MED ORDER — FENTANYL CITRATE (PF) 100 MCG/2ML IJ SOLN: 25.0000 ug | INTRAMUSCULAR | Status: DC | PRN

## 2019-11-19 MED ORDER — MIDAZOLAM HCL 2 MG/2ML IJ SOLN
INTRAMUSCULAR | Status: AC
  Filled 2019-11-19: qty 2

## 2019-11-19 MED ORDER — ONDANSETRON HCL 4 MG/2ML IJ SOLN
INTRAMUSCULAR | Status: AC
  Filled 2019-11-19: qty 2

## 2019-11-19 MED ORDER — ONDANSETRON HCL 4 MG/2ML IJ SOLN
INTRAMUSCULAR | Status: DC | PRN
  Administered 2019-11-19: 4 mg via INTRAVENOUS

## 2019-11-19 MED ORDER — MELOXICAM 7.5 MG PO TABS: 7.5000 mg | ORAL_TABLET | Freq: Every day | ORAL | 0 refills | Status: AC

## 2019-11-19 MED ORDER — ROCURONIUM BROMIDE 10 MG/ML (PF) SYRINGE
PREFILLED_SYRINGE | INTRAVENOUS | Status: AC
  Filled 2019-11-19: qty 10

## 2019-11-19 MED ORDER — PHENYLEPHRINE HCL-NACL 10-0.9 MG/250ML-% IV SOLN
INTRAVENOUS | Status: DC | PRN
  Administered 2019-11-19: 30 ug/min via INTRAVENOUS

## 2019-11-19 MED ORDER — SUGAMMADEX SODIUM 200 MG/2ML IV SOLN
INTRAVENOUS | Status: DC | PRN
  Administered 2019-11-19: 200 mg via INTRAVENOUS

## 2019-11-19 MED ORDER — HYDROCODONE-ACETAMINOPHEN 5-325 MG PO TABS: 1.0000 | ORAL_TABLET | Freq: Four times a day (QID) | ORAL | 0 refills | Status: DC | PRN

## 2019-11-19 MED ORDER — MIDAZOLAM HCL 2 MG/2ML IJ SOLN
2.0000 mg | Freq: Once | INTRAMUSCULAR | Status: AC
  Administered 2019-11-19: 2 mg via INTRAVENOUS

## 2019-11-19 MED ORDER — FENTANYL CITRATE (PF) 100 MCG/2ML IJ SOLN
INTRAMUSCULAR | Status: DC | PRN
  Administered 2019-11-19: 50 ug via INTRAVENOUS

## 2019-11-19 MED ORDER — PHENYLEPHRINE HCL (PRESSORS) 10 MG/ML IV SOLN
INTRAVENOUS | Status: AC
  Filled 2019-11-19: qty 1

## 2019-11-19 MED ORDER — EPINEPHRINE PF 1 MG/ML IJ SOLN
INTRAMUSCULAR | Status: AC
  Filled 2019-11-19: qty 1

## 2019-11-19 MED ORDER — ROCURONIUM BROMIDE 100 MG/10ML IV SOLN
INTRAVENOUS | Status: DC | PRN
  Administered 2019-11-19: 50 mg via INTRAVENOUS

## 2019-11-19 MED ORDER — CEFAZOLIN SODIUM-DEXTROSE 2-4 GM/100ML-% IV SOLN
INTRAVENOUS | Status: AC
  Filled 2019-11-19: qty 100

## 2019-11-19 MED ORDER — DEXAMETHASONE SODIUM PHOSPHATE 10 MG/ML IJ SOLN
INTRAMUSCULAR | Status: AC
  Filled 2019-11-19: qty 1

## 2019-11-19 MED ORDER — PHENYLEPHRINE 40 MCG/ML (10ML) SYRINGE FOR IV PUSH (FOR BLOOD PRESSURE SUPPORT)
PREFILLED_SYRINGE | INTRAVENOUS | Status: DC | PRN
  Administered 2019-11-19: 80 ug via INTRAVENOUS

## 2019-11-19 SURGICAL SUPPLY — 65 items
BLADE EXCALIBUR 4.0MM X 13CM (MISCELLANEOUS) ×1
BLADE EXCALIBUR 4.0X13 (MISCELLANEOUS) ×2 IMPLANT
BURR OVAL 8 FLU 4.0MM X 13CM (MISCELLANEOUS)
BURR OVAL 8 FLU 4.0X13 (MISCELLANEOUS) IMPLANT
CANNULA 5.75X71 LONG (CANNULA) IMPLANT
CANNULA PASSPORT 5 (CANNULA) IMPLANT
CANNULA PASSPORT 5CM (CANNULA)
CANNULA PASSPORT BUTTON 10-40 (CANNULA) IMPLANT
CANNULA TWIST IN 8.25X7CM (CANNULA) IMPLANT
CHLORAPREP W/TINT 26 (MISCELLANEOUS) ×3 IMPLANT
CLOSURE STERI-STRIP 1/2X4 (GAUZE/BANDAGES/DRESSINGS) ×1
CLSR STERI-STRIP ANTIMIC 1/2X4 (GAUZE/BANDAGES/DRESSINGS) ×2 IMPLANT
COVER WAND RF STERILE (DRAPES) IMPLANT
DECANTER SPIKE VIAL GLASS SM (MISCELLANEOUS) IMPLANT
DISSECTOR 3.5MM X 13CM CVD (MISCELLANEOUS) IMPLANT
DISSECTOR 4.0MMX13CM CVD (MISCELLANEOUS) IMPLANT
DRAPE IMP U-DRAPE 54X76 (DRAPES) ×3 IMPLANT
DRAPE INCISE IOBAN 66X45 STRL (DRAPES) IMPLANT
DRAPE SHOULDER BEACH CHAIR (DRAPES) ×3 IMPLANT
DRSG PAD ABDOMINAL 8X10 ST (GAUZE/BANDAGES/DRESSINGS) ×3 IMPLANT
DW OUTFLOW CASSETTE/TUBE SET (MISCELLANEOUS) ×3 IMPLANT
GAUZE SPONGE 4X4 12PLY STRL (GAUZE/BANDAGES/DRESSINGS) ×3 IMPLANT
GAUZE XEROFORM 1X8 LF (GAUZE/BANDAGES/DRESSINGS) IMPLANT
GLOVE BIO SURGEON STRL SZ 6.5 (GLOVE) ×2 IMPLANT
GLOVE BIO SURGEONS STRL SZ 6.5 (GLOVE) ×1
GLOVE BIOGEL M 6.5 STRL (GLOVE) ×6 IMPLANT
GLOVE BIOGEL PI IND STRL 6.5 (GLOVE) ×1 IMPLANT
GLOVE BIOGEL PI IND STRL 8 (GLOVE) ×1 IMPLANT
GLOVE BIOGEL PI INDICATOR 6.5 (GLOVE) ×2
GLOVE BIOGEL PI INDICATOR 8 (GLOVE) ×2
GLOVE ECLIPSE 8.0 STRL XLNG CF (GLOVE) ×3 IMPLANT
GOWN STRL REUS W/ TWL LRG LVL3 (GOWN DISPOSABLE) ×2 IMPLANT
GOWN STRL REUS W/TWL LRG LVL3 (GOWN DISPOSABLE) ×4
GOWN STRL REUS W/TWL XL LVL3 (GOWN DISPOSABLE) ×3 IMPLANT
KIT STABILIZATION SHOULDER (MISCELLANEOUS) ×3 IMPLANT
KIT STR SPEAR 1.8 FBRTK DISP (KITS) IMPLANT
LASSO 90 CVE QUICKPAS (DISPOSABLE) IMPLANT
LASSO CRESCENT QUICKPASS (SUTURE) IMPLANT
MANIFOLD NEPTUNE II (INSTRUMENTS) ×3 IMPLANT
NDL SAFETY ECLIPSE 18X1.5 (NEEDLE) ×1 IMPLANT
NEEDLE HYPO 18GX1.5 SHARP (NEEDLE) ×2
NEEDLE SCORPION MULTI FIRE (NEEDLE) IMPLANT
PACK BASIN DAY SURGERY FS (CUSTOM PROCEDURE TRAY) ×3 IMPLANT
PACK DSU ARTHROSCOPY (CUSTOM PROCEDURE TRAY) ×3 IMPLANT
PAD ORTHO SHOULDER 7X19 LRG (SOFTGOODS) ×3 IMPLANT
PORT APPOLLO RF 90DEGREE MULTI (SURGICAL WAND) ×3 IMPLANT
RESTRAINT HEAD UNIVERSAL NS (MISCELLANEOUS) ×3 IMPLANT
SHEET MEDIUM DRAPE 40X70 STRL (DRAPES) ×3 IMPLANT
SLEEVE SCD COMPRESS KNEE MED (MISCELLANEOUS) ×3 IMPLANT
SLING ARM FOAM STRAP LRG (SOFTGOODS) IMPLANT
SLING ARM FOAM STRAP XLG (SOFTGOODS) ×3 IMPLANT
SUT FIBERWIRE #2 38 T-5 BLUE (SUTURE)
SUT MNCRL AB 4-0 PS2 18 (SUTURE) ×3 IMPLANT
SUT PDS AB 1 CT  36 (SUTURE)
SUT PDS AB 1 CT 36 (SUTURE) IMPLANT
SUT TIGER TAPE 7 IN WHITE (SUTURE) IMPLANT
SUTURE FIBERWR #2 38 T-5 BLUE (SUTURE) IMPLANT
SUTURE TAPE TIGERLINK 1.3MM BL (SUTURE) IMPLANT
SUTURETAPE TIGERLINK 1.3MM BL (SUTURE)
SYR 5ML LL (SYRINGE) ×3 IMPLANT
TAPE FIBER 2MM 7IN #2 BLUE (SUTURE) IMPLANT
TOWEL GREEN STERILE FF (TOWEL DISPOSABLE) ×3 IMPLANT
TUBE CONNECTING 20'X1/4 (TUBING) ×1
TUBE CONNECTING 20X1/4 (TUBING) ×2 IMPLANT
TUBING ARTHROSCOPY IRRIG 16FT (MISCELLANEOUS) ×3 IMPLANT

## 2019-11-19 NOTE — Anesthesia Postprocedure Evaluation (Signed)
Anesthesia Post Note  Patient: [name]  Procedure(s) Performed: ARTHROSCOPY LEFT SHOULDER DEBRIDEMENT WITH LYSIS RESECT ADHESION WITH MANIPULATION (Left Shoulder)     Patient location during evaluation: PACU Anesthesia Type: General Level of consciousness: awake and alert Pain management: pain level controlled Vital Signs Assessment: post-procedure vital signs reviewed and stable Respiratory status: spontaneous breathing, nonlabored ventilation and respiratory function stable Cardiovascular status: blood pressure returned to baseline and stable Postop Assessment: no apparent nausea or vomiting Anesthetic complications: no   No complications documented.  Last Vitals:  Vitals:   11/19/19 1015 11/19/19 1017  BP: 97/79   Pulse: 86 90  Resp: (!) 21 (!) 25  Temp:    SpO2: 100% 98%    Last Pain:  Vitals:   11/19/19 1015  TempSrc:   PainSc: [place]

## 2019-11-19 NOTE — Interval H&P Note (Signed)
History and Physical Interval Note:  11/19/2019 7:22 AM  [name]  has presented today for surgery, with the diagnosis of LEFT SHOULDER ANKYLOSIS AND CARTILAGE DISORDERS.  The various methods of treatment have been discussed with the patient and family. After consideration of risks, benefits and other options for treatment, the patient has consented to  Procedure(s): ARTHROSCOPY LEFT SHOULDER DEBRIDEMENT WITH LYSIS RESECT ADHESION WITH MANIPULATION (Left) as a surgical intervention.  The patient's history has been reviewed, patient examined, no change in status, stable for surgery.  I have reviewed the patient's chart and labs.  Questions were answered to the patient's satisfaction.     Hiram Gash

## 2019-11-19 NOTE — Anesthesia Preprocedure Evaluation (Addendum)
Anesthesia Evaluation  Patient identified by MRN, date of birth, ID band Patient awake    Reviewed: Allergy & Precautions, NPO status , Patient's Chart, lab work & pertinent test results  History of Anesthesia Complications Negative for: history of anesthetic complications  Airway Mallampati: II  TM Distance: >3 FB Neck ROM: Full    Dental  (+) Dental Advisory Given, Teeth Intact   Pulmonary sleep apnea and Continuous Positive Airway Pressure Ventilation , former smoker,    Pulmonary exam normal        Cardiovascular hypertension, + CAD, + Past MI and + CABG (2009)  Normal cardiovascular exam     Neuro/Psych PSYCHIATRIC DISORDERS Depression  Neuromuscular disease (numbness and tingling left hand after CABG, now resolved)    GI/Hepatic negative GI ROS, (+)     substance abuse  alcohol use,   Endo/Other   Obesity   Renal/GU negative Renal ROS     Musculoskeletal  (+) Arthritis ,   Abdominal   Peds  Hematology negative hematology ROS (+)   Anesthesia Other Findings Covid test negative   Reproductive/Obstetrics                            Anesthesia Physical Anesthesia Plan  ASA: III  Anesthesia Plan: General   Post-op Pain Management:    Induction: Intravenous  PONV Risk Score and Plan: 2 and Treatment may vary due to age or medical condition, Ondansetron, Dexamethasone and Midazolam  Airway Management Planned: Oral ETT  Additional Equipment: None  Intra-op Plan:   Post-operative Plan: Extubation in OR  Informed Consent: I have reviewed the patients History and Physical, chart, labs and discussed the procedure including the risks, benefits and alternatives for the proposed anesthesia with the patient or authorized representative who has indicated his/her understanding and acceptance.     Dental advisory given  Plan Discussed with: CRNA and Anesthesiologist  Anesthesia  Plan Comments:        Anesthesia Quick Evaluation

## 2019-11-19 NOTE — Op Note (Signed)
Orthopaedic Surgery Operative Note (CSN: [id number])  [name]  04-08-1966 Date of Surgery: 11/19/2019   Diagnoses:  Left shoulder arthrofibrosis  Procedure: Arthroscopic extensive debridement  Left shoulder arthroscopic lysis of adhesions and manipulation   Operative Finding Exam under anesthesia: Preoperatively patient had 0 of external rotation and forward elevation with a hard stop 120 degrees postoperatively we will get 175 degrees of forward elevation and a easy supple 60 to 70 degrees of external rotation with additional force we can get to 90. Articular space: No loose bodies, capsule was asked significantly inflamed and injected as well as the interval being very scarred down.  There is subdeltoid adhesions as well. Chondral surfaces:Intact, no sign of chondral degeneration on the glenoid or humeral head Biceps: Previous tenotomy and tenodesis was noted Subscapularis: Normal though inflamed Superior Cuff: Normal Bursal side: Significant injection and subdeltoid and subacromial adhesions noted.  Successful completion of the planned procedure.  Patient had significant adhesions and is at risk for stiffening up again however surgery was retained.   Post-operative plan: The patient will be non-weightbearing in a sling until nerve block wears off.  The patient will be discharged home.  DVT prophylaxis not indicated in ambulatory upper extremity patient without known risk factors.   Pain control with PRN pain medication preferring oral medicines.  Follow up plan will be scheduled in approximately 7 days for incision check.  Post-Op Diagnosis: Same Surgeons:Primary: Nicholas Gash, MD Assistants:Nicholas McBane PA-C Location: [facility] OR ROOM 6 Anesthesia: General with Exparel interscalene block Antibiotics: Ancef 2 g Tourniquet time: None Estimated Blood Loss: Minimal Complications: None Specimens: None Implants: * No implants in log *  Indications for Surgery:   [name] Hess is a 54 y.o. male with recent subacromial decompression, distal clavicle excision and biceps tenodesis with good motion at 6 weeks however he had arthroereisis and tightening of his joint after that point.  The risks and benefits were explained at length including but not limited to continued pain, repeat stiffness, failure of cuff in the future, continued need for surgery amongst others   Procedure:   Patient was correctly identified in the preoperative holding area and operative site marked.  Patient brought to OR and positioned beachchair on an [place] table ensuring that all bony prominences were padded and the head was in an appropriate location.  Anesthesia was induced and the operative shoulder was prepped and draped in the usual sterile fashion.  Timeout was called preincision.  A standard posterior viewing portal was made after localizing the portal with a spinal needle.  An anterior accessory portal was also made.  After clearing the articular space the camera was positioned in the subacromial space.  Findings above.    Extensive debridement was performed of the interval, the subacromial space, the rotator cuff and the labrum.  We performed a capsular release involving interval first releasing the subscapularis and 3 sides.  We had good mobility and released the [facility].  At that point we were able to visualize the muscle belly of the subscapularis medially and carried this capsular release down completing 270 degree release after switching the camera to the anterior portal.  Performed a gentle manipulation under anesthesia with findings above.  We able to put the scope back into visualize the rotator cuff was intact.  With subacromial space performed a complete bursectomy and adhesion release.  There was significant subdeltoid adhesions anteriorly release these as well.  Good hemostasis was obtained.  The  incisions were closed with absorbable monocryl and steri strips.  A sterile  dressing was placed along with a sling. The patient was awoken from general anesthesia and taken to the PACU in stable condition without complication.   Nicholas Chapel, PA-C, present and scrubbed throughout the case, critical for completion in a timely fashion, and for retraction, instrumentation, closure.

## 2019-11-19 NOTE — Progress Notes (Signed)
Assisted Dr. Fransisco Beau with left, ultrasound guided, interscalene  block. Side rails up, monitors on throughout procedure. See vital signs in flow sheet. Tolerated Procedure well.

## 2019-11-19 NOTE — Transfer of Care (Signed)
Immediate Anesthesia Transfer of Care Note  Patient: [name]  Procedure(s) Performed: ARTHROSCOPY LEFT SHOULDER DEBRIDEMENT WITH LYSIS RESECT ADHESION WITH MANIPULATION (Left Shoulder)  Patient Location: PACU  Anesthesia Type:General and Regional  Level of Consciousness: drowsy and patient cooperative  Airway & Oxygen Therapy: Patient Spontanous Breathing and Patient connected to face mask oxygen  Post-op Assessment: Report given to RN and Post -op Vital signs reviewed and stable  Post vital signs: Reviewed and stable  Last Vitals:  Vitals Value Taken Time  BP 124/86 11/19/19 0945  Temp    Pulse 79 11/19/19 0949  Resp 19 11/19/19 0949  SpO2 97 % 11/19/19 0949  Vitals shown include unvalidated device data.  Last Pain:  Vitals:   11/19/19 0723  TempSrc: [place]  PainSc: 2       Patients Stated Pain Goal: 5 (25/05/39 7673)  Complications: No complications documented.

## 2019-11-19 NOTE — Discharge Instructions (Signed)
Post Anesthesia Home Care Instructions  Activity: Get plenty of rest for the remainder of the day. A responsible individual must stay with you for 24 hours following the procedure.  For the next 24 hours, DO NOT: -Drive a car -Paediatric nurse -Drink alcoholic beverages -Take any medication unless instructed by your physician -Make any legal decisions or sign important papers.  Meals: Start with liquid foods such as gelatin or soup. Progress to regular foods as tolerated. Avoid greasy, spicy, heavy foods. If nausea and/or vomiting occur, drink only clear liquids until the nausea and/or vomiting subsides. Call your physician if vomiting continues.  Special Instructions/Symptoms: Your throat may feel dry or sore from the anesthesia or the breathing tube placed in your throat during surgery. If this causes discomfort, gargle with warm salt water. The discomfort should disappear within 24 hours.  If you had a scopolamine patch placed behind your ear for the management of post- operative nausea and/or vomiting:  1. The medication in the patch is effective for 72 hours, after which it should be removed.  Wrap patch in a tissue and discard in the trash. Wash hands thoroughly with soap and water. 2. You may remove the patch earlier than 72 hours if you experience unpleasant side effects which may include dry mouth, dizziness or visual disturbances. 3. Avoid touching the patch. Wash your hands with soap and water after contact with the patch.      Regional Anesthesia Blocks  1. Numbness or the inability to move the "blocked" extremity may last from 3-48 hours after placement. The length of time depends on the medication injected and your individual response to the medication. If the numbness is not going away after 48 hours, call your surgeon.  2. The extremity that is blocked will need to be protected until the numbness is gone and the  Strength has returned. Because you cannot feel it, you  will need to take extra care to avoid injury. Because it may be weak, you may have difficulty moving it or using it. You may not know what position it is in without looking at it while the block is in effect.  3. For blocks in the legs and feet, returning to weight bearing and walking needs to be done carefully. You will need to wait until the numbness is entirely gone and the strength has returned. You should be able to move your leg and foot normally before you try and bear weight or walk. You will need someone to be with you when you first try to ensure you do not fall and possibly risk injury.  4. Bruising and tenderness at the needle site are common side effects and will resolve in a few days.  5. Persistent numbness or new problems with movement should be communicated to the surgeon or the [place] [phone] [place] [phone]).   Call your surgeon if you experience:   1.  Fever over 101.0. 2.  Inability to urinate. 3.  Nausea and/or vomiting. 4.  Extreme swelling or bruising at the surgical site. 5.  Continued bleeding from the incision. 6.  Increased pain, redness or drainage from the incision. 7.  Problems related to your pain medication. 8.  Any problems and/or concerns   Information for Discharge Teaching: EXPAREL (bupivacaine liposome injectable suspension)   Your surgeon or anesthesiologist gave you EXPAREL(bupivacaine) to help control your pain after surgery.   EXPAREL is a local anesthetic that provides pain relief by numbing the  tissue around the surgical site.  EXPAREL is designed to release pain medication over time and can control pain for up to 72 hours.  Depending on how you respond to EXPAREL, you may require less pain medication during your recovery.  Possible side effects:  Temporary loss of sensation or ability to move in the area where bupivacaine was injected.  Nausea, vomiting, constipation  Rarely, numbness  and tingling in your mouth or lips, lightheadedness, or anxiety may occur.  Call your doctor right away if you think you may be experiencing any of these sensations, or if you have other questions regarding possible side effects.  Follow all other discharge instructions given to you by your surgeon or nurse. Eat a healthy diet and drink plenty of water or other fluids.  If you return to the hospital for any reason within 96 hours following the administration of EXPAREL, it is important for health care providers to know that you have received this anesthetic. A teal colored band has been placed on your arm with the date, time and amount of EXPAREL you have received in order to alert and inform your health care providers. Please leave this armband in place for the full 96 hours following administration, and then you may remove the band.

## 2019-11-19 NOTE — Anesthesia Procedure Notes (Signed)
Anesthesia Regional Block: Interscalene brachial plexus block   Pre-Anesthetic Checklist: ,, timeout performed, Correct Patient, Correct Site, Correct Laterality, Correct Procedure, Correct Position, site marked, Risks and benefits discussed,  Surgical consent,  Pre-op evaluation,  At surgeon's request and post-op pain management  Laterality: Left  Prep: chloraprep       Needles:  Injection technique: Single-shot  Needle Type: Echogenic Needle     Needle Length: 5cm  Needle Gauge: 21     Additional Needles:   Narrative:  Start time: 11/19/2019 7:49 AM End time: 11/19/2019 7:52 AM Injection made incrementally with aspirations every 5 mL.  Performed by: Personally  Anesthesiologist: Audry Pili, MD  Additional Notes: No pain on injection. No increased resistance to injection. Injection made in 5cc increments. Good needle visualization. Patient tolerated the procedure well.

## 2019-11-19 NOTE — Anesthesia Procedure Notes (Signed)
Procedure Name: Intubation Date/Time: 11/19/2019 8:43 AM Performed by: Gwyndolyn Saxon, CRNA Pre-anesthesia Checklist: Patient identified, Emergency Drugs available, Suction available and Patient being monitored Patient Re-evaluated:Patient Re-evaluated prior to induction Oxygen Delivery Method: Circle system utilized Preoxygenation: Pre-oxygenation with 100% oxygen Induction Type: IV induction Ventilation: Mask ventilation without difficulty Laryngoscope Size: Miller and 3 Grade View: Grade III Tube type: Oral Tube size: 7.5 mm Number of attempts: 1 Airway Equipment and Method: Patient positioned with wedge pillow and Stylet Placement Confirmation: positive ETCO2 and breath sounds checked- equal and bilateral Secured at: 24 cm Tube secured with: Tape Dental Injury: Teeth and Oropharynx as per pre-operative assessment

## 2019-11-20 ENCOUNTER — Encounter (HOSPITAL_BASED_OUTPATIENT_CLINIC_OR_DEPARTMENT_OTHER): Payer: Self-pay | Admitting: Orthopaedic Surgery

## 2020-01-15 IMAGING — CT CT ABD-PELV W/ CM
2 of 5 series · 16 of 46 positions shown, 18 images · IV contrast (APPLIED)
Comparison: None.

CLINICAL DATA: Lower abdominal pain for 3 weeks.

EXAM:
CT ABDOMEN AND PELVIS WITH CONTRAST
TECHNIQUE: Multidetector CT imaging of the abdomen and pelvis was performed
using the standard protocol following bolus administration of
intravenous contrast.
CONTRAST:  100mL YBKIPY-5II IOPAMIDOL (YBKIPY-5II) INJECTION 61%

[Series 2: axial st · axial · 0.83mm/px · z∈[-692,-147]mm · 13 of 123 slices shown, 15 images]
[im 7/123  soft-tissue]
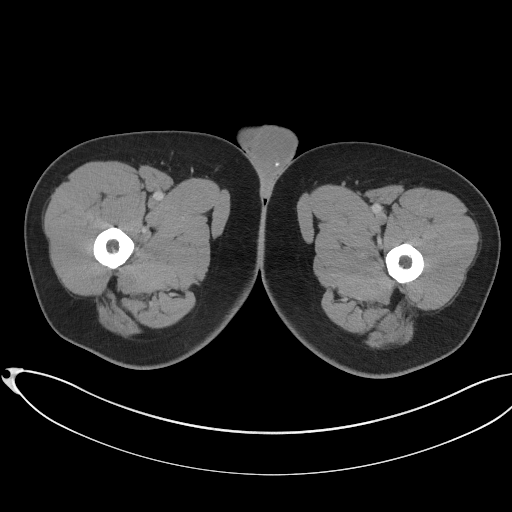
[im 7/123  bone]
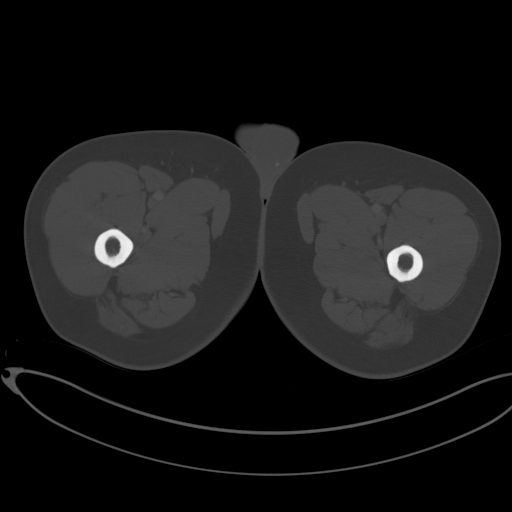
[im 20/123  soft-tissue]
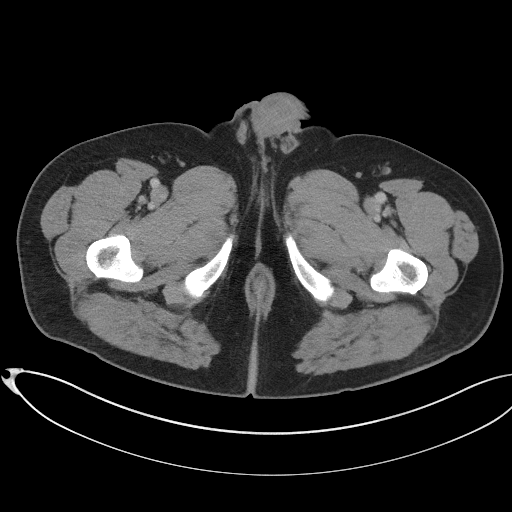
[im 26/123  soft-tissue]
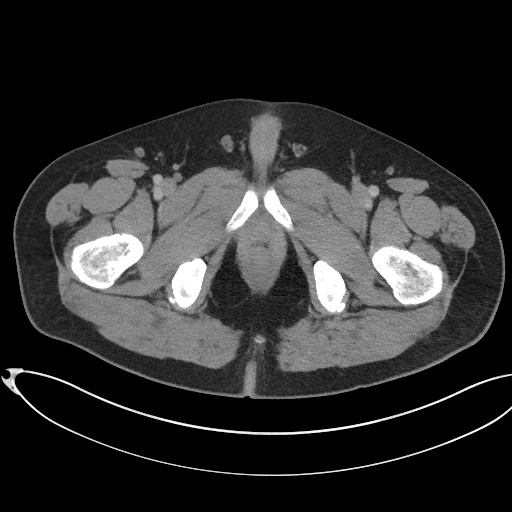
[im 33/123  soft-tissue]
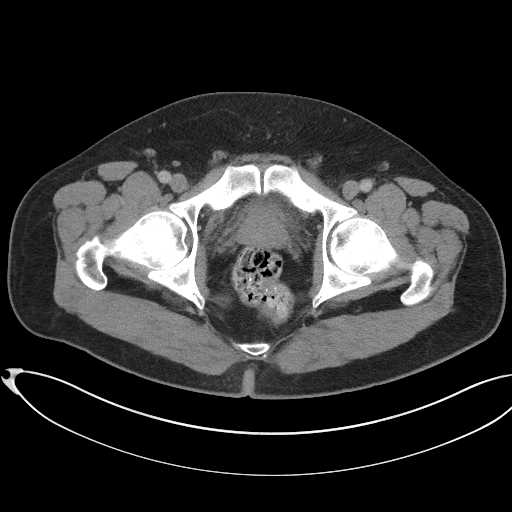
[im 45/123  soft-tissue]
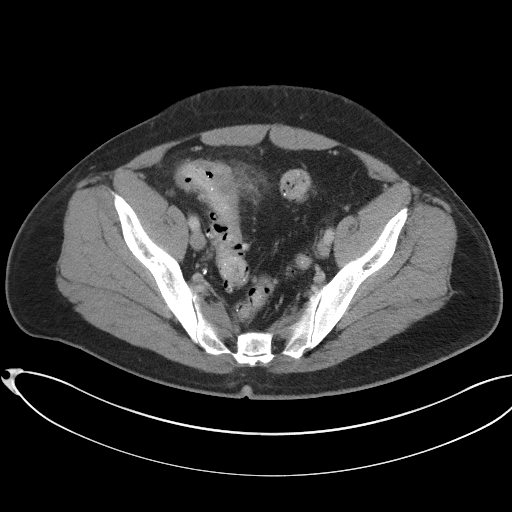
[im 52/123  soft-tissue]
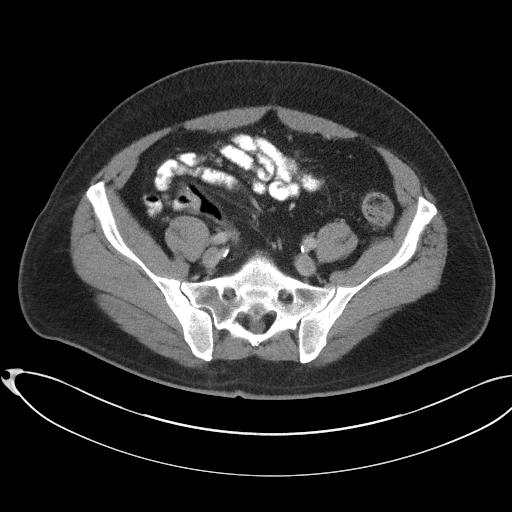
[im 65/123  soft-tissue]
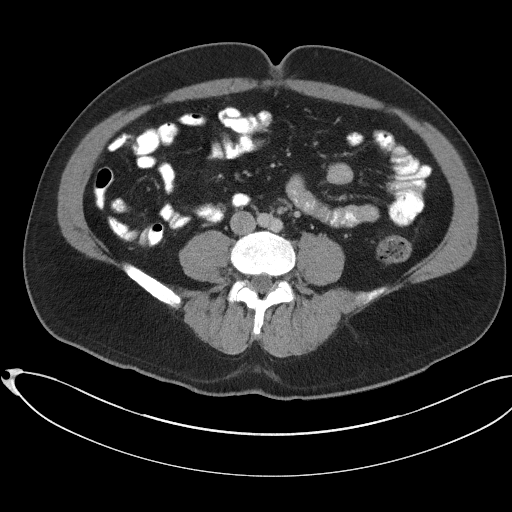
[im 71/123  soft-tissue]
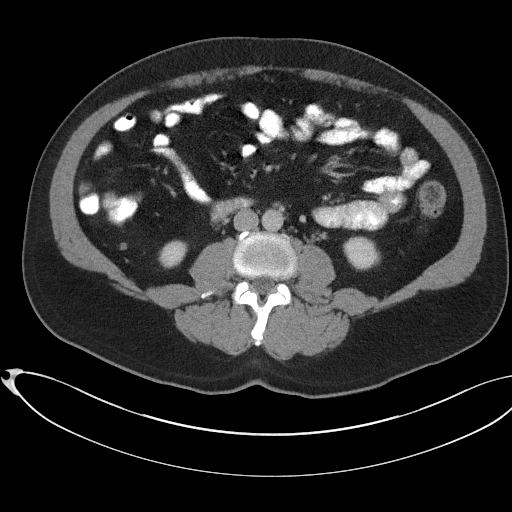
[im 78/123  soft-tissue]
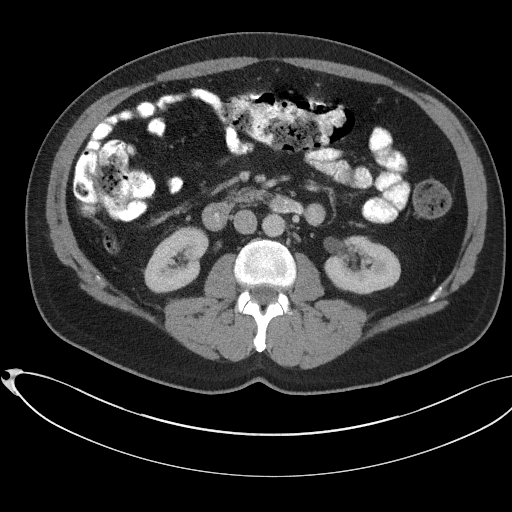
[im 78/123  bone]
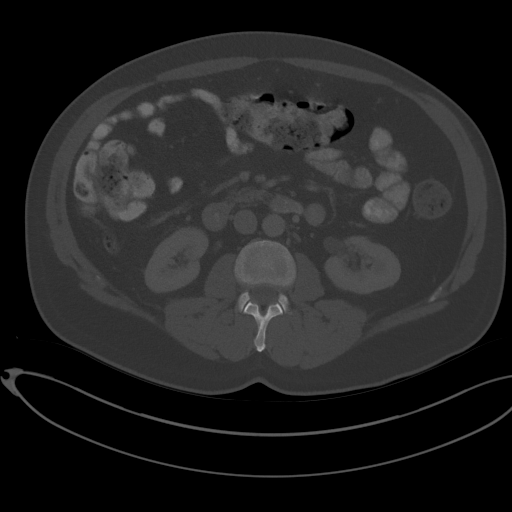
[im 90/123  soft-tissue]
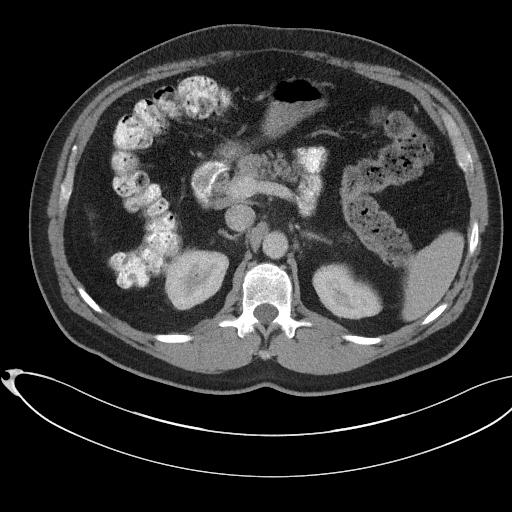
[im 97/123  soft-tissue]
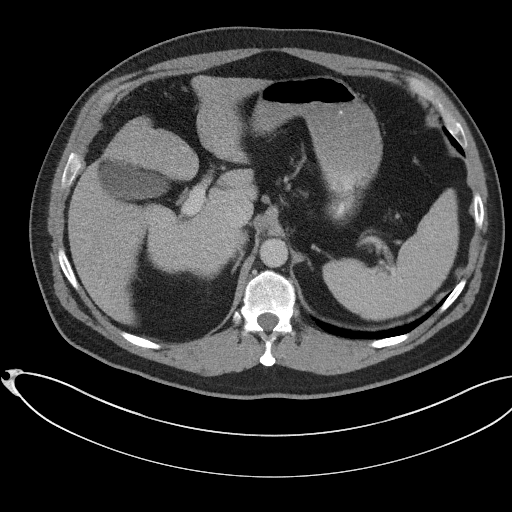
[im 103/123  soft-tissue]
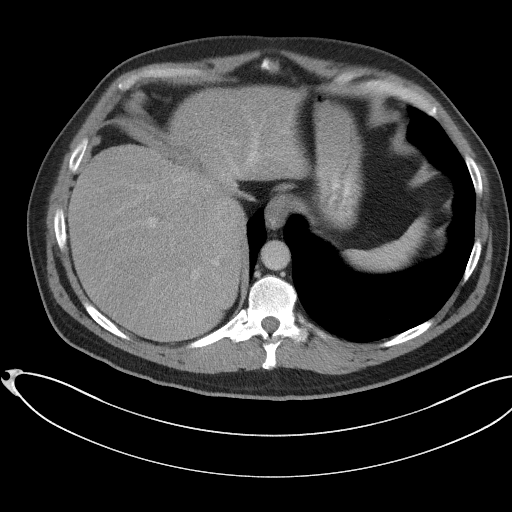
[im 116/123  soft-tissue]
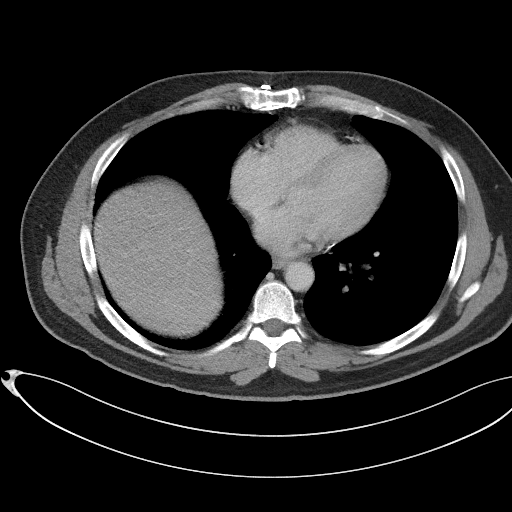

[Series 4: coronal st · coronal · 0.95mm/px · 3 of 100 slices shown]
[im 34/100  soft-tissue]
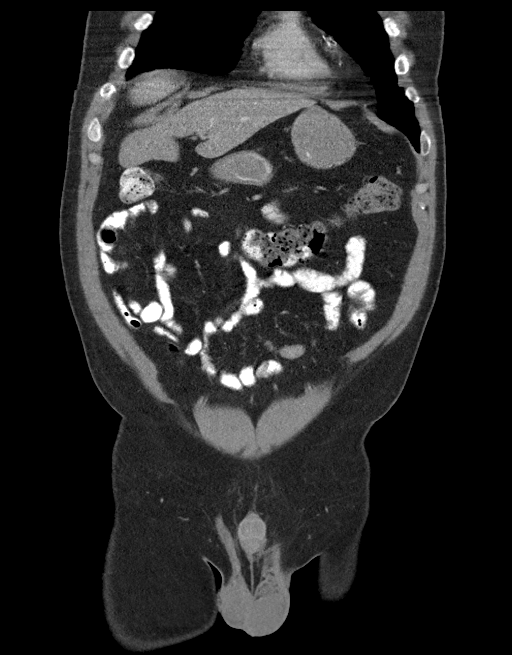
[im 45/100  soft-tissue]
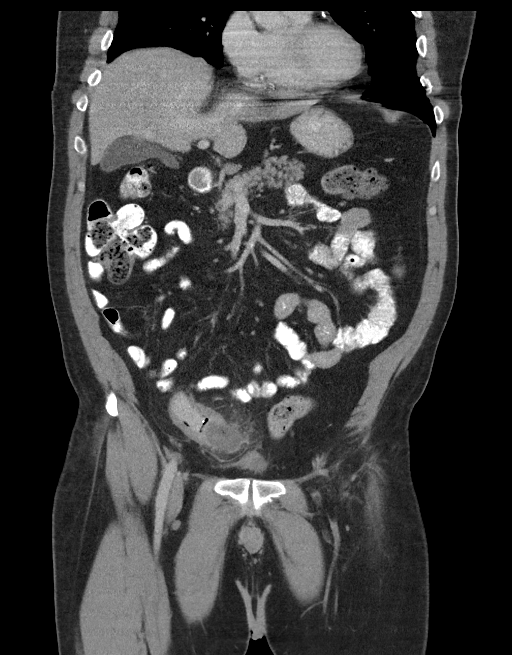
[im 56/100  soft-tissue]
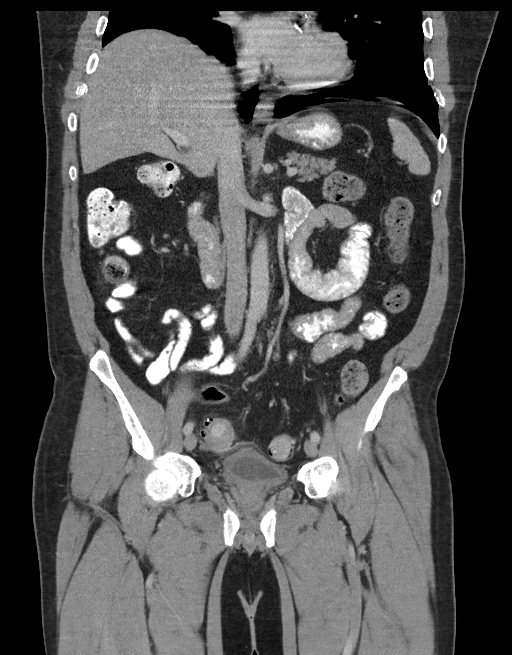

[16 of 46 positions shown; findings below may reference images not displayed]

FINDINGS: Lower chest: No acute abnormality.

Hepatobiliary: No focal liver abnormality is seen. No gallstones,
gallbladder wall thickening, or biliary dilatation.

Pancreas: Unremarkable. No pancreatic ductal dilatation or
surrounding inflammatory changes.

Spleen: Normal in size without focal abnormality.

Adrenals/Urinary Tract: Adrenal glands are unremarkable. Kidneys are
normal, without renal calculi, focal lesion, or hydronephrosis.
Bladder is unremarkable.

Stomach/Bowel: The stomach and appendix appear normal. There is no
evidence of bowel obstruction. Sigmoid diverticulitis is noted with
3.7 x 2.8 cm abscess or contained perforation.

Vascular/Lymphatic: No significant vascular findings are present. No
enlarged abdominal or pelvic lymph nodes.

Reproductive: Prostate is unremarkable.

Other: No abdominal wall hernia or abnormality. No abdominopelvic
ascites.

Musculoskeletal: No acute or significant osseous findings.
IMPRESSION: Acute sigmoid diverticulitis is noted. There is 3.7 x 2.8 cm
adjacent fluid collection concerning for abscess or contained
perforation. These results will be called to the ordering clinician
or representative by the Radiologist Assistant, and communication
documented in the PACS or zVision Dashboard.

## 2020-03-06 ENCOUNTER — Other Ambulatory Visit: Payer: Self-pay | Admitting: Sports Medicine

## 2020-03-06 DIAGNOSIS — J0110 Acute frontal sinusitis, unspecified: Secondary | ICD-10-CM

## 2020-09-13 ENCOUNTER — Other Ambulatory Visit: Payer: Self-pay | Admitting: Cardiology

## 2020-10-06 NOTE — Progress Notes (Addendum)
Cardiology Office Note  Date: 10/07/2020   ID: [name], DOB 1965-06-16, MRN [id number]  PCP:  Silverio Decamp, MD  Cardiologist:  Carlyle Dolly, MD Electrophysiologist:  None   Chief Complaint: 1 year follow-up  History of Present Illness: [name] is a 55 y.o. male with a history of CAD with CABG in 2009 four-vessel (LIMA-LAD, left radial to OM, SVG-diagonal, SVG-RCA), HLD, OSA on CPAP. Recent left shoulder arthroscopic surgery.  Last seen by Dr. Harl Bowie 10/07/2018 via telemedicine.  He had no complaints of chest pain, dyspnea on exertion or shortness of breath.  He was compliant with his medications.  Medications are limited due to blood pressures historically low.  He had not been on beta-blockers or ACE inhibitors.  He was compliant with his statin medications with a recent LDL in January 2020 of 81.  He was having issues with his CPAP secondary to OSA.  Saw Dr. Radford Pax on 12/16/2018 for OSA.  His machine had stopped working in March 2020 and not been able to procure a new one since that time.  He'd been having a lot of problems with excessive daytime sleepiness, frequent awakenings during the night, snoring, napping during the day and wanted a new device.  Dr. Radford Pax ordered him a new ResMed CPAP with heated humidity and mask of choice set on 8 cm of water.  He is here for 1 year follow-up today.  He states he is recently retired in the last few days.  States his only issue recently is of bothersome cough.  He is status post 2 recent shoulder surgeries.  He denies any anginal or exertional symptoms, palpitations or arrhythmias, orthostatic symptoms, CVA or TIA-like symptoms, PND, orthopnea, bleeding, claudication, DVT or PE-like symptoms, lower extremity edema. He states he is compliant with his medications.  He has not had lab work in a year with his PCP.  He plans to schedule an upcoming annual follow-up with his PCP blood pressure is well controlled  today.  Past Medical History:  Diagnosis Date  . Allergy   . Anemia   . CAD (coronary artery disease) of artery bypass graft 2009   Status post bypass grafting 2009 , status post stress echo 2012 no ischemia.  . Depression    past hx after heart surgery   . Dermatitis    Left upper extremity  . Diverticulitis 07/11/2017   Status post percutaneous drainage catheter placement for treatment  . Diverticulosis    Severe in sigmoid colon, Status post percutaneous drainage catheter placement for treatment  . Erectile dysfunction   . History of diverticular abscess 07/11/2017   Status post percutaneous drainage catheter placement for treatment, 07/30/17 drain removed  . History of kidney infection 1990  . Hx of adenomatous colonic polyps 09/02/2014  . Hyperlipidemia, mixed   . Hypotension, unspecified   . Myocardial infarction [facility])    per pt had 2 MI- 2009 both   . Numbness and tingling in left hand    S/p graft for CABG  . Sleep apnea    On 8 cm of water CPAP    Past Surgical History:  Procedure Laterality Date  . BICEPT TENODESIS Left 07/09/2019   Procedure: BICEPS TENODESIS;  Surgeon: Hiram Gash, MD;  Location: [place];  Service: Orthopedics;  Laterality: Left;  . COLONOSCOPY  2016  . COLONOSCOPY  2007  . COLONOSCOPY W/ POLYPECTOMY  10/07/2017  . CORONARY ARTERY BYPASS GRAFT  2009  x4  . CYSTOSCOPY W/ RETROGRADES Bilateral 11/27/2017   Procedure: BILATERAL CYSTOSCOPY WITH RETROGRADE PYELOGRAM, BILATERAL URETERAL CATHETERIZATION;  Surgeon: Lucas Mallow, MD;  Location[facility];  Service: Urology;  Laterality: Bilateral;  . FLEXIBLE SIGMOIDOSCOPY N/A 11/27/2017   Procedure: FLEXIBLE SIGMOIDOSCOPY;  Surgeon: Ileana Roup, MD;  Location[facility];  Service: General;  Laterality: N/A;  . IR RADIOLOGIST EVAL & MGMT  07/30/2017  . LAPAROSCOPIC SIGMOID COLECTOMY N/A 11/27/2017   Procedure: LAPAROSCOPIC SIGMOIDECTOMY;  Surgeon: Ileana Roup, MD;   [facility];  Service: General;  Laterality: N/A;  . SHOULDER ARTHROSCOPY Left 11/19/2019   Procedure: ARTHROSCOPY LEFT SHOULDER DEBRIDEMENT WITH LYSIS RESECT ADHESION WITH MANIPULATION;  Surgeon: Hiram Gash, MD;  Location: [place];  Service: Orthopedics;  Laterality: Left;    Current Outpatient Medications  Medication Sig Dispense Refill  . aspirin 81 MG tablet Take 81 mg by mouth every evening.    . Calcium Carbonate-Vitamin D 500-125 MG-UNIT TABS Take by mouth.    . cholecalciferol (VITAMIN D) 1000 units tablet Take 1,000 Units by mouth every evening.    . montelukast (SINGULAIR) 10 MG tablet TAKE 1 TABLET AT BEDTIME 90 tablet 3  . Multiple Vitamin (MULTIVITAMIN) tablet Take 1 tablet by mouth every evening.     . Omega-3 Fatty Acids (FISH OIL) 1200 MG CAPS Take 1,200 mg by mouth every evening.    . rosuvastatin (CRESTOR) 40 MG tablet TAKE 1 TABLET DAILY 90 tablet 1   No current facility-administered medications for this visit.   Allergies:  Oxycodone and Sulfonamide derivatives   Social History: The patient  reports that he quit smoking about 15 years ago. His smoking use included cigarettes and cigars. He started smoking about 20 years ago. He has a 1.00 pack-year smoking history. He quit smokeless tobacco use about 23 years ago.  His smokeless tobacco use included chew. He reports current alcohol use of about 15.0 standard drinks of alcohol per week. He reports that he does not use drugs.   Family History: The patient's family history includes Colon cancer in his paternal uncle; Coronary artery disease in an other family member; Heart disease in his father and mother; Heart failure in an other family member; Kidney disease in an other family member.   ROS:  Please see the history of present illness. Otherwise, complete review of systems is positive for none.  All other systems are reviewed and negative.   Physical Exam: VS:  BP 128/82   Pulse 74   Ht 6\' 3"   (1.905 m)   Wt 246 lb (111.6 kg)   SpO2 98%   BMI 30.75 kg/m , BMI Body mass index is 30.75 kg/m.  Wt Readings from Last 3 Encounters:  10/07/20 246 lb (111.6 kg)  11/19/19 244 lb 11.4 oz (111 kg)  10/08/19 240 lb 3.2 oz (109 kg)    General: Patient appears comfortable at rest. Neck: Supple, no elevated JVP or carotid bruits, no thyromegaly. Lungs: Clear to auscultation, nonlabored breathing at rest. Cardiac: Regular rate and rhythm, no S3 or significant systolic murmur, no pericardial rub. Extremities: No pitting edema, distal pulses 2+. Skin: Warm and dry. Musculoskeletal: No kyphosis. Neuropsychiatric: Alert and oriented x3, affect grossly appropriate.  ECG: EKG 10/07/2020 normal sinus rhythm rate of 70, septal infarct, age undetermined  Recent Labwork: No results found for requested labs within last 8760 hours.     Component Value Date/Time   CHOL 94 08/05/2019 0809  TRIG 66 08/05/2019 0809   HDL 33 (L) 08/05/2019 0809   CHOLHDL 2.8 08/05/2019 0809   VLDL 34 (H) 09/20/2016 0837   LDLCALC 46 08/05/2019 0809    Other Studies Reviewed Today:  12/2007 ANGIOGRAPHIC DATA:  1. The left main is free of critical disease.  2. The LAD courses to the apex. The LAD has multiple lesions  including a 70% proximal mid and 80% lesion at the takeoff of the  third diagonal. There were then in the distal vessel multiple  lesions including a 90, 70, 50, 90, and 90 in sequence. There is a  70% apical stenosis as well. The first diagonal branch is small-to-  moderate in size with 90% ostial narrowing. Second diagonal is  fairly large in size with 80% proximal and 80% mid narrowing.  Second diagonal does appear to be graftable. The third diagonal  has 90% ostial narrowing.  3. The circumflex has a tiny second marginal with 90% narrowing that  was totally occluded and it started filling late by collaterals.  4. The right coronary artery is totally occluded proximally and  fills  in by left-to-right collaterals.  5. Ventriculography in the RAO projection reveals estimated ejection  fraction of 45-50%. There is inferior wall motion abnormality in  the mid inferior wall corresponding the circumflex territory and  anteroapical abnormality, that is mild.  CONCLUSIONS:  1. Total occlusion of the circumflex and right coronary arteries.  2. Minimum 8 successive lesions in the left anterior descending  artery, and including high-grade disease of the large second  diagonal branch as noted above.  3. Progressive symptoms compatible with angina pectoris.  RECOMMENDATIONS: The LAD is not ideal for grafting. Nonetheless, the  apical vessel possibly could be grafted as well as the large diagonal.  In addition, the marginal and the RCA also appear to be optimal for  grafting. Surgical consultation will be obtained.  Assessment and Plan:  1. CAD in native artery   2. Mixed hyperlipidemia   3. OSA (obstructive sleep apnea)    1. CAD in native artery He denies any recent progressive anginal or exertional symptoms.  Previous four-vessel CABG 2009.  Continue aspirin 81 mg.  2. Mixed hyperlipidemia Last lipid panel showed total cholesterol 94, HDL 33, triglycerides 66, LDL 46.  Continue Crestor 40 mg daily, omega-3 fatty acid 1200 mg capsules daily  3. Obstructive sleep apnea syndrome He is compliant with CPAP therapy.    Medication Adjustments/Labs and Tests Ordered: Current medicines are reviewed at length with the patient today.  Concerns regarding medicines are outlined above.   Disposition: Follow-up with Dr. Harl Bowie or APP 1 year  Signed, Levell July, NP 10/07/2020 8:32 AM    [facility] [place], [place], [place] [address] Phone: [phone]; Fax: [phone]

## 2020-10-07 ENCOUNTER — Other Ambulatory Visit: Payer: Self-pay

## 2020-10-07 ENCOUNTER — Ambulatory Visit (INDEPENDENT_AMBULATORY_CARE_PROVIDER_SITE_OTHER): Payer: No Typology Code available for payment source | Admitting: Family Medicine

## 2020-10-07 ENCOUNTER — Encounter: Payer: Self-pay | Admitting: Family Medicine

## 2020-10-07 VITALS — BP 128/82 | HR 74 | Ht 75.0 in | Wt 246.0 lb

## 2020-10-07 DIAGNOSIS — E782 Mixed hyperlipidemia: Secondary | ICD-10-CM | POA: Diagnosis not present

## 2020-10-07 DIAGNOSIS — G4733 Obstructive sleep apnea (adult) (pediatric): Secondary | ICD-10-CM | POA: Diagnosis not present

## 2020-10-07 DIAGNOSIS — I2510 Atherosclerotic heart disease of native coronary artery without angina pectoris: Secondary | ICD-10-CM

## 2020-10-07 NOTE — Patient Instructions (Addendum)

## 2020-11-21 IMAGING — CT CT ABD-PELV W/ CM
2 of 5 series · 16 of 46 positions shown, 18 images · IV contrast (iopamidol)
Comparison: 07/30/2017

CLINICAL DATA: Acute generalized abdominal pain starting today.

EXAM:
CT ABDOMEN AND PELVIS WITH CONTRAST
TECHNIQUE: Multidetector CT imaging of the abdomen and pelvis was performed
using the standard protocol following bolus administration of
intravenous contrast.
CONTRAST:  100mL NSDHWN-633 IOPAMIDOL (NSDHWN-633) INJECTION 61%

[Series 2: axial st · axial · 0.84mm/px · z∈[+655,+1130]mm · 13 of 107 slices shown, 15 images]
[im 6/107  soft-tissue]
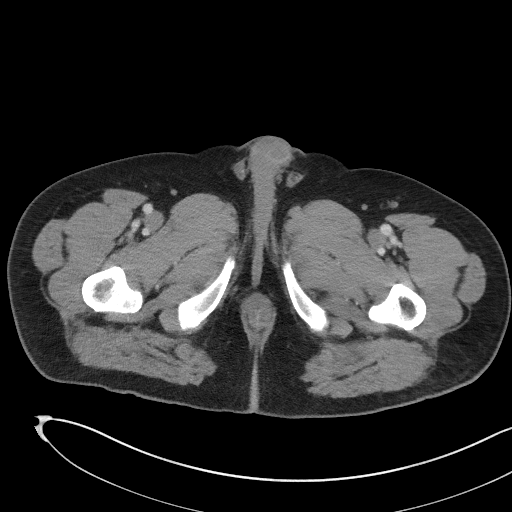
[im 6/107  bone]
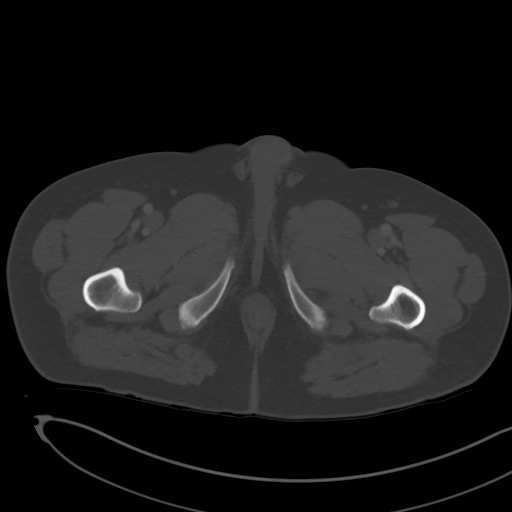
[im 12/107  soft-tissue]
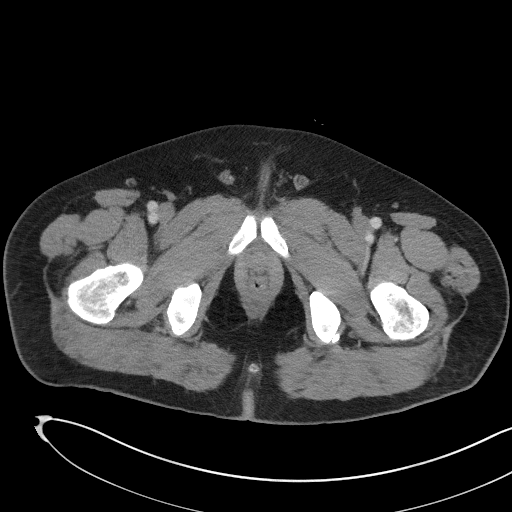
[im 24/107  soft-tissue]
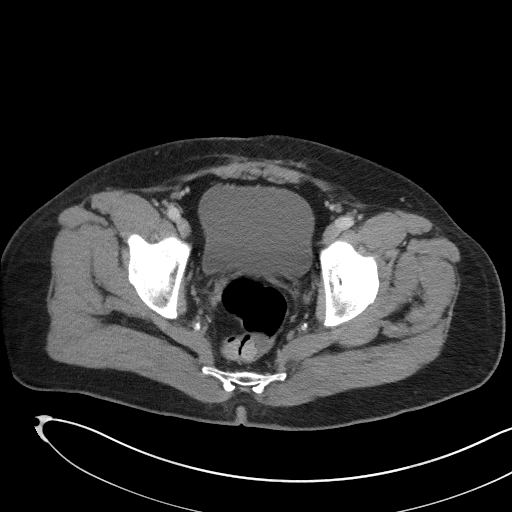
[im 30/107  soft-tissue]
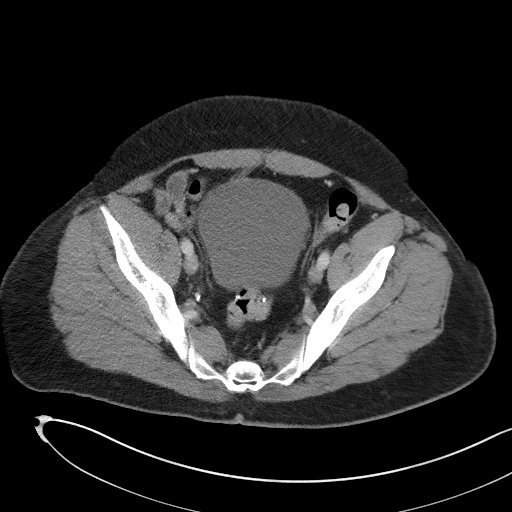
[im 36/107  soft-tissue]
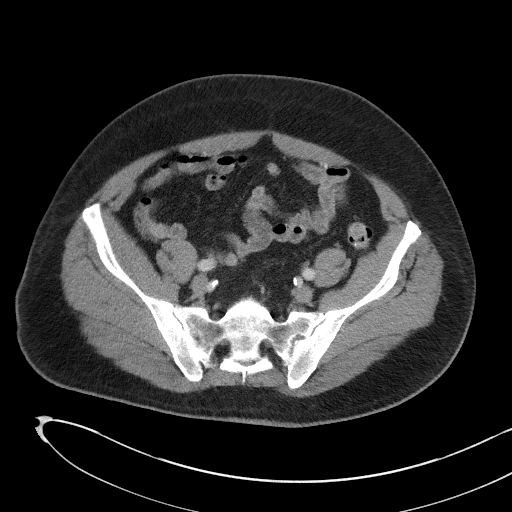
[im 48/107  soft-tissue]
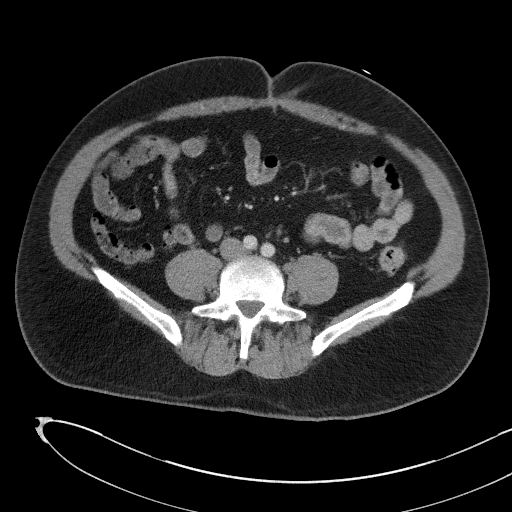
[im 54/107  soft-tissue]
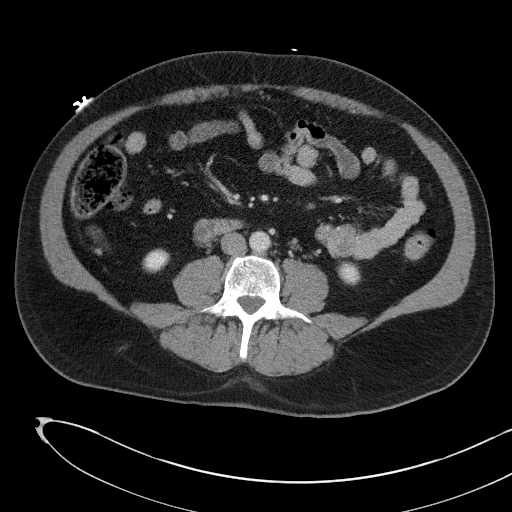
[im 59/107  soft-tissue]
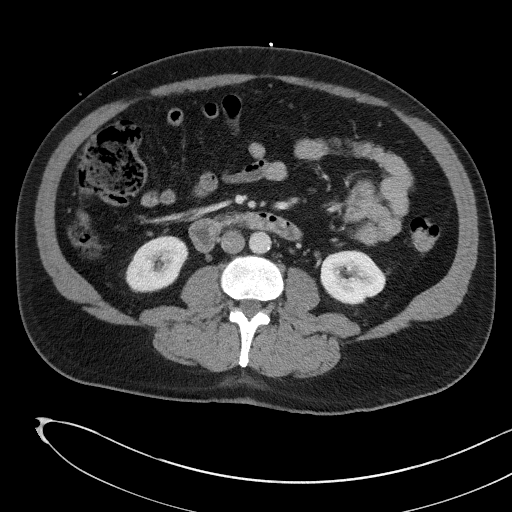
[im 71/107  soft-tissue]
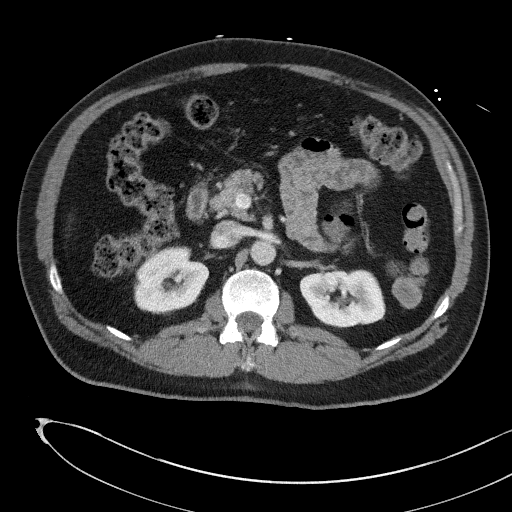
[im 71/107  bone]
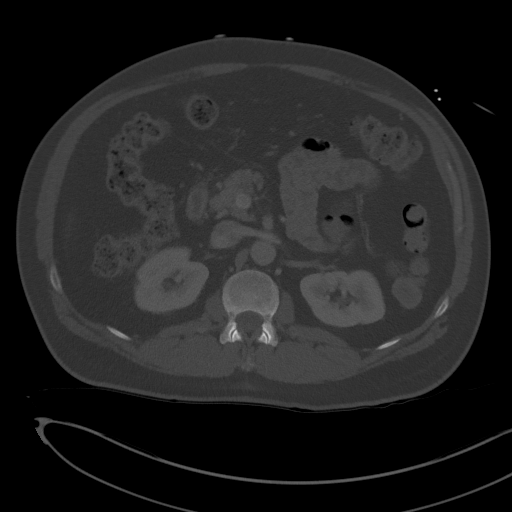
[im 77/107  soft-tissue]
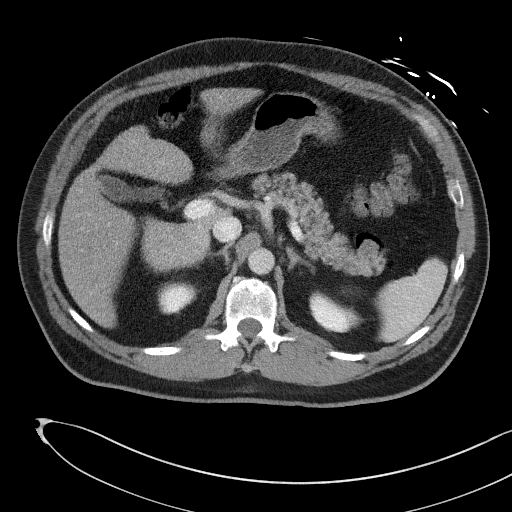
[im 83/107  soft-tissue]
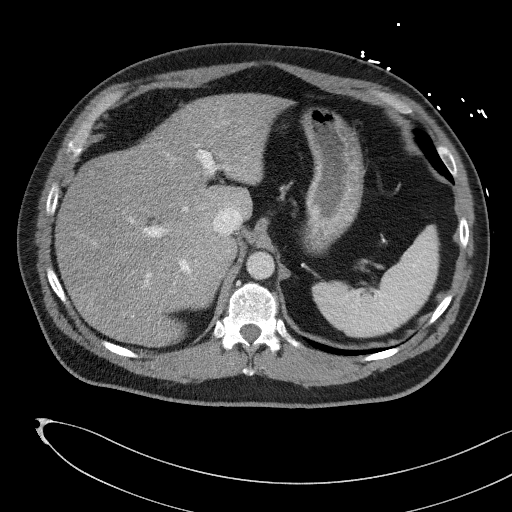
[im 95/107  soft-tissue]
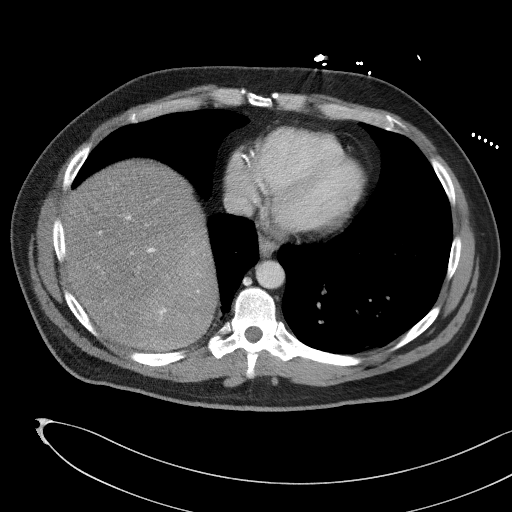
[im 101/107  soft-tissue]
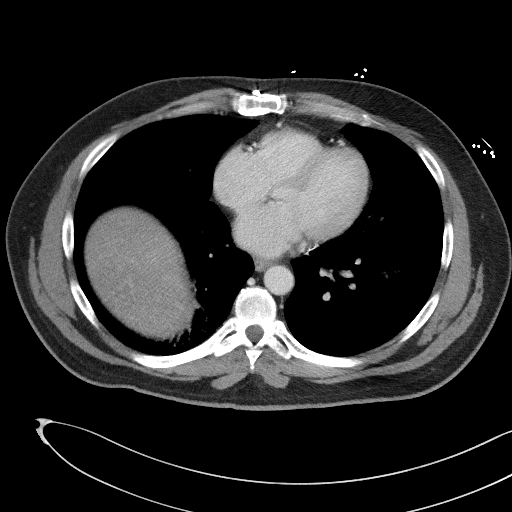

[Series 5: coronal st · coronal · 0.93mm/px · 3 of 115 slices shown]
[im 39/115  soft-tissue]
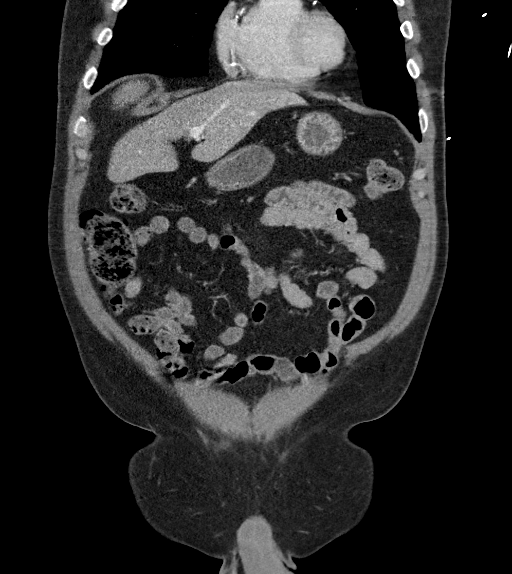
[im 51/115  soft-tissue]
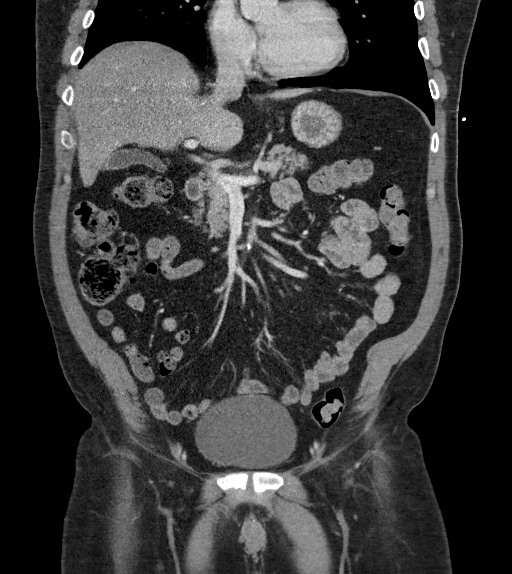
[im 64/115  soft-tissue]
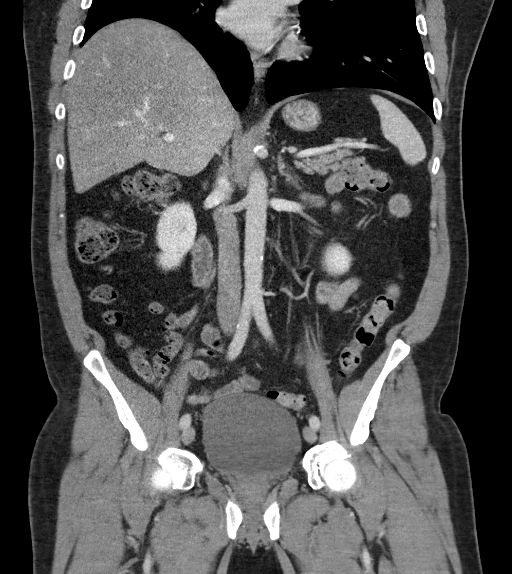

[16 of 46 positions shown; findings below may reference images not displayed]

FINDINGS: Lower chest: Atelectasis in the lung bases. Coronary artery
calcifications.

Hepatobiliary: Diffuse fatty infiltration of the liver. No focal
liver abnormality is seen. No gallstones, gallbladder wall
thickening, or biliary dilatation.

Pancreas: Unremarkable. No pancreatic ductal dilatation or
surrounding inflammatory changes.

Spleen: Normal in size without focal abnormality.

Adrenals/Urinary Tract: Adrenal glands are unremarkable. Kidneys are
normal, without renal calculi, focal lesion, or hydronephrosis.
Bladder is unremarkable.

Stomach/Bowel: Surgical anastomosis at the low sigmoid colon.
Stomach, small bowel, and colon are not abnormally distended. No
wall thickening or inflammatory changes appreciated. Stool fills the
colon. Appendix is normal.

Vascular/Lymphatic: No significant vascular findings are present. No
enlarged abdominal or pelvic lymph nodes.

Reproductive: Prostate is unremarkable.

Other: No abdominal wall hernia or abnormality. No abdominopelvic
ascites.

Musculoskeletal: No acute or significant osseous findings.
Postoperative sternotomy.
IMPRESSION: No acute process demonstrated in the abdomen or pelvis. No evidence
of bowel obstruction or inflammation. Diffuse fatty infiltration of
the liver.

## 2021-01-11 ENCOUNTER — Encounter: Payer: Self-pay | Admitting: Family Medicine

## 2021-01-11 ENCOUNTER — Ambulatory Visit (INDEPENDENT_AMBULATORY_CARE_PROVIDER_SITE_OTHER): Payer: No Typology Code available for payment source | Admitting: Family Medicine

## 2021-01-11 VITALS — BP 125/78 | HR 87 | Ht 75.0 in | Wt 242.0 lb

## 2021-01-11 DIAGNOSIS — K529 Noninfective gastroenteritis and colitis, unspecified: Secondary | ICD-10-CM

## 2021-01-11 DIAGNOSIS — R10824 Left lower quadrant rebound abdominal tenderness: Secondary | ICD-10-CM | POA: Diagnosis not present

## 2021-01-11 LAB — COMPLETE METABOLIC PANEL WITH GFR
AG Ratio: 1.4 (calc) (ref 1.0–2.5)
ALT: 45 U/L (ref 9–46)
AST: 40 U/L — ABNORMAL HIGH (ref 10–35)
Albumin: 4.4 g/dL (ref 3.6–5.1)
Alkaline phosphatase (APISO): 83 U/L (ref 35–144)
BUN: 10 mg/dL (ref 7–25)
CO2: 23 mmol/L (ref 20–32)
Calcium: 9.8 mg/dL (ref 8.6–10.3)
Chloride: 104 mmol/L (ref 98–110)
Creat: 0.8 mg/dL (ref 0.70–1.30)
Globulin: 3.2 g/dL (calc) (ref 1.9–3.7)
Glucose, Bld: 114 mg/dL — ABNORMAL HIGH (ref 65–99)
Potassium: 4.1 mmol/L (ref 3.5–5.3)
Sodium: 137 mmol/L (ref 135–146)
Total Bilirubin: 0.7 mg/dL (ref 0.2–1.2)
Total Protein: 7.6 g/dL (ref 6.1–8.1)
eGFR: 104 mL/min/{1.73_m2} (ref >=60)

## 2021-01-11 LAB — CBC WITH DIFFERENTIAL/PLATELET
Absolute Monocytes: 869 cells/uL (ref 200–950)
Basophils Absolute: 107 cells/uL (ref 0–200)
Basophils Relative: 0.9 %
Eosinophils Absolute: 512 cells/uL — ABNORMAL HIGH (ref 15–500)
Eosinophils Relative: 4.3 %
HCT: 48 % (ref 38.5–50.0)
Hemoglobin: 16.2 g/dL (ref 13.2–17.1)
Lymphs Abs: 2499 cells/uL (ref 850–3900)
MCH: 29.4 pg (ref 27.0–33.0)
MCHC: 33.8 g/dL (ref 32.0–36.0)
MCV: 87.1 fL (ref 80.0–100.0)
MPV: 10.1 fL (ref 7.5–12.5)
Monocytes Relative: 7.3 %
Neutro Abs: 7914 cells/uL — ABNORMAL HIGH (ref 1500–7800)
Neutrophils Relative %: 66.5 %
Platelets: 314 10*3/uL (ref 140–400)
RBC: 5.51 10*6/uL (ref 4.20–5.80)
RDW: 12.7 % (ref 11.0–15.0)
Total Lymphocyte: 21 %
WBC: 11.9 10*3/uL — ABNORMAL HIGH (ref 3.8–10.8)

## 2021-01-11 LAB — LIPASE: Lipase: 27 U/L (ref 7–60)

## 2021-01-11 MED ORDER — AMOXICILLIN-POT CLAVULANATE 875-125 MG PO TABS
1.0000 | ORAL_TABLET | Freq: Two times a day (BID) | ORAL | 0 refills | Status: DC
Start: 2021-01-11 — End: 2021-04-24

## 2021-01-11 NOTE — Progress Notes (Signed)
Slight elevation of WBC. Will send over an antibiotic.

## 2021-01-11 NOTE — Addendum Note (Signed)
Addended by: Beatrice Lecher D on: 01/11/2021 02:17 PM   Modules accepted: Orders

## 2021-01-11 NOTE — Progress Notes (Signed)
Acute Office Visit  Subjective:    Patient ID: [name], male    DOB: [birth year], 55 y.o.   MRN: [id number]  No chief complaint on file.   HPI Patient is in today for diarrhea that started on Sunday, a week ago.  He states he is going about 10-12 times per day.  No nausea or vomiting with it.  No fevers or chills.  He has had part of his colon removed and he has had a prior history of diverticulitis but says that this feels different.  Has been trying Imodium and Pepto-Bismol without any relief.  He says he has not had a lot of pain but sometimes a little bit right before the bowel movement.  He says the is not having loose stools he describes it as "roughage".  But his stools are urgent.  He did test for COVID it was negative.  His appetite is normal.  He denies any major dietary changes.  Past Medical History:  Diagnosis Date   Allergy    Anemia    CAD (coronary artery disease) of artery bypass graft 2009   Status post bypass grafting 2009 , status post stress echo 2012 no ischemia.   Depression    past hx after heart surgery    Dermatitis    Left upper extremity   Diverticulitis 07/11/2017   Status post percutaneous drainage catheter placement for treatment   Diverticulosis    Severe in sigmoid colon, Status post percutaneous drainage catheter placement for treatment   Erectile dysfunction    History of diverticular abscess 07/11/2017   Status post percutaneous drainage catheter placement for treatment, 07/30/17 drain removed   History of kidney infection 1990   Hx of adenomatous colonic polyps 09/02/2014   Hyperlipidemia, mixed    Hypotension, unspecified    Myocardial infarction ([place])    per pt had 2 MI- 2009 both    Numbness and tingling in left hand    S/p graft for CABG   Sleep apnea    On 8 cm of water CPAP    Past Surgical History:  Procedure Laterality Date   BICEPT TENODESIS Left 07/09/2019   Procedure: BICEPS TENODESIS;  Surgeon: Hiram Gash, MD;   Location: [place];  Service: Orthopedics;  Laterality: Left;   COLONOSCOPY  2016   COLONOSCOPY  2007   COLONOSCOPY W/ POLYPECTOMY  10/07/2017   CORONARY ARTERY BYPASS GRAFT  2009   x4   CYSTOSCOPY W/ RETROGRADES Bilateral 11/27/2017   Procedure: BILATERAL CYSTOSCOPY WITH RETROGRADE PYELOGRAM, BILATERAL URETERAL CATHETERIZATION;  Surgeon: Lucas Mallow, MD;  [facility];  Service: Urology;  Laterality: Bilateral;   FLEXIBLE SIGMOIDOSCOPY N/A 11/27/2017   Procedure: FLEXIBLE SIGMOIDOSCOPY;  Surgeon: Ileana Roup, MD;  [facility];  Service: General;  Laterality: N/A;   IR RADIOLOGIST EVAL & MGMT  07/30/2017   LAPAROSCOPIC SIGMOID COLECTOMY N/A 11/27/2017   Procedure: LAPAROSCOPIC SIGMOIDECTOMY;  Surgeon: Ileana Roup, MD;  [facility];  Service: General;  Laterality: N/A;   SHOULDER ARTHROSCOPY Left 11/19/2019   Procedure: ARTHROSCOPY LEFT SHOULDER DEBRIDEMENT WITH LYSIS RESECT ADHESION WITH MANIPULATION;  Surgeon: Hiram Gash, MD;  Location: [place];  Service: Orthopedics;  Laterality: Left;    Family History  Problem Relation Age of Onset   Heart disease Mother    Heart disease Father    Colon cancer Paternal Uncle    Kidney disease Other    Coronary artery  disease Other    Heart failure Other    Esophageal cancer Neg Hx    Prostate cancer Neg Hx    Rectal cancer Neg Hx    Stomach cancer Neg Hx    Colon polyps Neg Hx     Social History   Socioeconomic History   Marital status: Married    Spouse name: Not on file   Number of children: Not on file   Years of education: Not on file   Highest education level: Not on file  Occupational History   Occupation: Full time    Employer: [employer]  Tobacco Use   Smoking status: Former    Packs/day: 0.20    Years: 5.00    Pack years: 1.00    Types: Cigarettes, Cigars    Start date: 05/28/2000    Quit date: 05/07/2005    Years since quitting: 15.6   Smokeless  tobacco: Former    Types: Chew    Quit date: 05/07/1997   Tobacco comments:    chewed tobacco x 20 yrs - quit 2000  Vaping Use   Vaping Use: Never used  Substance and Sexual Activity   Alcohol use: Yes    Alcohol/week: 15.0 standard drinks    Types: 10 Shots of liquor, 5 Standard drinks or equivalent per week    Comment: beer & liquor   Drug use: No   Sexual activity: Yes    Partners: Female  Other Topics Concern   Not on file  Social History Narrative   Not on file   Social Determinants of Health   Financial Resource Strain: Not on file  Food Insecurity: Not on file  Transportation Needs: Not on file  Physical Activity: Not on file  Stress: Not on file  Social Connections: Not on file  Intimate Partner Violence: Not on file    Outpatient Medications Prior to Visit  Medication Sig Dispense Refill   aspirin 81 MG tablet Take 81 mg by mouth every evening.     Calcium Carbonate-Vitamin D 500-125 MG-UNIT TABS Take by mouth.     cholecalciferol (VITAMIN D) 1000 units tablet Take 1,000 Units by mouth every evening.     montelukast (SINGULAIR) 10 MG tablet TAKE 1 TABLET AT BEDTIME 90 tablet 3   Multiple Vitamin (MULTIVITAMIN) tablet Take 1 tablet by mouth every evening.      Omega-3 Fatty Acids (FISH OIL) 1200 MG CAPS Take 1,200 mg by mouth every evening.     rosuvastatin (CRESTOR) 40 MG tablet TAKE 1 TABLET DAILY 90 tablet 1   No facility-administered medications prior to visit.    Allergies  Allergen Reactions   Oxycodone Hives and Other (See Comments)    fever   Sulfonamide Derivatives Hives, Shortness Of Breath and Rash    Review of Systems     Objective:    Physical Exam Constitutional:      Appearance: He is well-developed.  HENT:     Head: Normocephalic and atraumatic.  Cardiovascular:     Rate and Rhythm: Normal rate and regular rhythm.     Heart sounds: Normal heart sounds.  Pulmonary:     Effort: Pulmonary effort is normal.     Breath sounds: Normal  breath sounds.  Abdominal:     General: Abdomen is flat. Bowel sounds are normal.     Palpations: There is no mass.     Tenderness: There is abdominal tenderness. There is no guarding.     Hernia: No hernia is  present.     Comments: TTP in the LLQ   Skin:    General: Skin is warm and dry.  Neurological:     Mental Status: He is alert and oriented to person, place, and time.  Psychiatric:        Behavior: Behavior normal.    BP 125/78   Pulse 87   Ht '6\' 3"'$  (1.905 m)   Wt 242 lb 0.6 oz (109.8 kg)   SpO2 100%   BMI 30.25 kg/m  Wt Readings from Last 3 Encounters:  01/11/21 242 lb 0.6 oz (109.8 kg)  10/07/20 246 lb (111.6 kg)  11/19/19 244 lb 11.4 oz (111 kg)    Health Maintenance Due  Topic Date Due   COVID-19 Vaccine (1) Never done   Hepatitis C Screening  Never done   Zoster Vaccines- Shingrix (1 of 2) Never done   Pneumococcal Vaccine 79-30 Years old (2 - PCV) 01/03/2009    There are no preventive care reminders to display for this patient.   Lab Results  Component Value Date   TSH 3.35 08/05/2019   Lab Results  Component Value Date   WBC 10.3 09/01/2019   HGB 15.1 09/01/2019   HCT 43.8 09/01/2019   MCV 85.9 09/01/2019   PLT 300 09/01/2019   Lab Results  Component Value Date   NA 137 09/01/2019   K 4.0 09/01/2019   CO2 22 09/01/2019   GLUCOSE 104 (H) 09/01/2019   BUN 16 09/01/2019   CREATININE 0.94 09/01/2019   BILITOT 0.6 09/01/2019   ALKPHOS 63 09/01/2019   AST 20 09/01/2019   ALT 24 09/01/2019   PROT 7.5 09/01/2019   ALBUMIN 4.7 09/01/2019   CALCIUM 10.4 (H) 09/01/2019   ANIONGAP 10 09/01/2019   Lab Results  Component Value Date   CHOL 94 08/05/2019   Lab Results  Component Value Date   HDL 33 (L) 08/05/2019   Lab Results  Component Value Date   LDLCALC 46 08/05/2019   Lab Results  Component Value Date   TRIG 66 08/05/2019   Lab Results  Component Value Date   CHOLHDL 2.8 08/05/2019   Lab Results  Component Value Date   HGBA1C  5.6 06/26/2019       Assessment & Plan:   Problem List Items Addressed This Visit   None Visit Diagnoses     Left lower quadrant abdominal tenderness with rebound tenderness    -  Primary   Relevant Orders   CBC with Differential/Platelet   COMPLETE METABOLIC PANEL WITH GFR   Lipase   Frequent stools       Relevant Orders   CBC with Differential/Platelet   COMPLETE METABOLIC PANEL WITH GFR   Lipase      Lower quadrant pain with frequent/urgent stools, no diarrhea-highly suspicious for recurrent diverticulitis.  Less likely to be gastroenteritis/colitis.  We will get stat CBC.  Consider antibiotics since he has had prior history of abscess from diverticulitis even though he is afebrile today and has no rebound or guarding.  If white blood cell count is significantly elevated will consider additional imaging with CT scan.  Will call with results when it is available.  In the meantime recommend soft bland diet and fluids.  No orders of the defined types were placed in this encounter.    Beatrice Lecher, MD

## 2021-01-30 ENCOUNTER — Other Ambulatory Visit: Payer: Self-pay | Admitting: Sports Medicine

## 2021-01-30 DIAGNOSIS — J0110 Acute frontal sinusitis, unspecified: Secondary | ICD-10-CM

## 2021-02-15 ENCOUNTER — Other Ambulatory Visit: Payer: Self-pay | Admitting: Cardiology

## 2021-04-03 ENCOUNTER — Other Ambulatory Visit: Payer: Self-pay

## 2021-04-03 ENCOUNTER — Ambulatory Visit: Payer: No Typology Code available for payment source | Admitting: Sports Medicine

## 2021-04-03 VITALS — BP 116/81 | HR 96 | Wt 245.0 lb

## 2021-04-03 DIAGNOSIS — E119 Type 2 diabetes mellitus without complications: Secondary | ICD-10-CM

## 2021-04-03 DIAGNOSIS — Z0000 Encounter for general adult medical examination without abnormal findings: Secondary | ICD-10-CM

## 2021-04-03 DIAGNOSIS — J0110 Acute frontal sinusitis, unspecified: Secondary | ICD-10-CM | POA: Diagnosis not present

## 2021-04-03 DIAGNOSIS — I10 Essential (primary) hypertension: Secondary | ICD-10-CM

## 2021-04-03 DIAGNOSIS — R739 Hyperglycemia, unspecified: Secondary | ICD-10-CM

## 2021-04-03 DIAGNOSIS — Z23 Encounter for immunization: Secondary | ICD-10-CM | POA: Diagnosis not present

## 2021-04-03 DIAGNOSIS — R053 Chronic cough: Secondary | ICD-10-CM | POA: Diagnosis not present

## 2021-04-03 DIAGNOSIS — N139 Obstructive and reflux uropathy, unspecified: Secondary | ICD-10-CM

## 2021-04-03 MED ORDER — MONTELUKAST SODIUM 10 MG PO TABS: 10.0000 mg | ORAL_TABLET | Freq: Every day | ORAL | 3 refills | Status: DC

## 2021-04-03 NOTE — Progress Notes (Addendum)
Subjective:    CC: Annual Physical Exam  HPI:  This patient is here for their annual physical  I reviewed the past medical history, family history, social history, surgical history, and allergies today and no changes were needed.  Please see the problem list section below in epic for further details.  Past Medical History: Past Medical History:  Diagnosis Date   Allergy    Anemia    CAD (coronary artery disease) of artery bypass graft 2009   Status post bypass grafting 2009 , status post stress echo 2012 no ischemia.   Controlled type 2 diabetes mellitus without complication, without long-term current use of insulin ([facility]) 04/18/2021   Depression    past hx after heart surgery    Dermatitis    Left upper extremity   Diverticulitis 07/11/2017   Status post percutaneous drainage catheter placement for treatment   Diverticulosis    Severe in sigmoid colon, Status post percutaneous drainage catheter placement for treatment   Erectile dysfunction    History of diverticular abscess 07/11/2017   Status post percutaneous drainage catheter placement for treatment, 07/30/17 drain removed   History of kidney infection 1990   Hx of adenomatous colonic polyps 09/02/2014   Hyperlipidemia, mixed    Hypotension, unspecified    Myocardial infarction ([place])    per pt had 2 MI- 2009 both    Numbness and tingling in left hand    S/p graft for CABG   Sleep apnea    On 8 cm of water CPAP   Past Surgical History: Past Surgical History:  Procedure Laterality Date   BICEPT TENODESIS Left 07/09/2019   Procedure: BICEPS TENODESIS;  Surgeon: Hiram Gash, MD;  Location: [facility];  Service: Orthopedics;  Laterality: Left;   COLONOSCOPY  2016   COLONOSCOPY  2007   COLONOSCOPY W/ POLYPECTOMY  10/07/2017   CORONARY ARTERY BYPASS GRAFT  2009   x4   CYSTOSCOPY W/ RETROGRADES Bilateral 11/27/2017   Procedure: BILATERAL CYSTOSCOPY WITH RETROGRADE PYELOGRAM, BILATERAL URETERAL  CATHETERIZATION;  Surgeon: Lucas Mallow, MD;  Location: [facility];  Service: Urology;  Laterality: Bilateral;   FLEXIBLE SIGMOIDOSCOPY N/A 11/27/2017   Procedure: FLEXIBLE SIGMOIDOSCOPY;  Surgeon: Ileana Roup, MD;  Location: [facility];  Service: General;  Laterality: N/A;   IR RADIOLOGIST EVAL & MGMT  07/30/2017   LAPAROSCOPIC SIGMOID COLECTOMY N/A 11/27/2017   Procedure: LAPAROSCOPIC SIGMOIDECTOMY;  Surgeon: Ileana Roup, MD;  Location: [facility];  Service: General;  Laterality: N/A;   SHOULDER ARTHROSCOPY Left 11/19/2019   Procedure: ARTHROSCOPY LEFT SHOULDER DEBRIDEMENT WITH LYSIS RESECT ADHESION WITH MANIPULATION;  Surgeon: Hiram Gash, MD;  Location: [facility];  Service: Orthopedics;  Laterality: Left;   Social History: Social History   Socioeconomic History   Marital status: Married    Spouse name: Not on file   Number of children: Not on file   Years of education: Not on file   Highest education level: Not on file  Occupational History   Occupation: Full time    Employer: [employer]  Tobacco Use   Smoking status: Former    Packs/day: 0.20    Years: 5.00    Pack years: 1.00    Types: Cigarettes, Cigars    Start date: 05/28/2000    Quit date: 05/07/2005    Years since quitting: 15.9   Smokeless tobacco: Former    Types: Chew    Quit date: 05/07/1997   Tobacco comments:  chewed tobacco x 20 yrs - quit 2000  Vaping Use   Vaping Use: Never used  Substance and Sexual Activity   Alcohol use: Yes    Alcohol/week: 15.0 standard drinks    Types: 10 Shots of liquor, 5 Standard drinks or equivalent per week    Comment: beer & liquor   Drug use: No   Sexual activity: Yes    Partners: Female  Other Topics Concern   Not on file  Social History Narrative   Not on file   Social Determinants of Health   Financial Resource Strain: Not on file  Food Insecurity: Not on file  Transportation Needs: Not on file  Physical Activity: Not on file   Stress: Not on file  Social Connections: Not on file   Family History: Family History  Problem Relation Age of Onset   Heart disease Mother    Heart disease Father    Colon cancer Paternal Uncle    Kidney disease Other    Coronary artery disease Other    Heart failure Other    Esophageal cancer Neg Hx    Prostate cancer Neg Hx    Rectal cancer Neg Hx    Stomach cancer Neg Hx    Colon polyps Neg Hx    Allergies: Allergies  Allergen Reactions   Oxycodone Hives and Other (See Comments)    fever   Sulfonamide Derivatives Hives, Shortness Of Breath and Rash   Medications: See med rec.  Review of Systems: No headache, visual changes, nausea, vomiting, diarrhea, constipation, dizziness, abdominal pain, skin rash, fevers, chills, night sweats, swollen lymph nodes, weight loss, chest pain, body aches, joint swelling, muscle aches, shortness of breath, mood changes, visual or auditory hallucinations.  Objective:    General: Well Developed, well nourished, and in no acute distress.  Neuro: Alert and oriented x3, extra-ocular muscles intact, sensation grossly intact. Cranial nerves II through XII are intact, motor, sensory, and coordinative functions are all intact. HEENT: Normocephalic, atraumatic, pupils equal round reactive to light, neck supple, no masses, no lymphadenopathy, thyroid nonpalpable. Oropharynx, nasopharynx, external ear canals are unremarkable. Skin: Warm and dry, no rashes noted.  Cardiac: Regular rate and rhythm, no murmurs rubs or gallops.  Respiratory: Clear to auscultation bilaterally. Not using accessory muscles, speaking in full sentences.  Abdominal: Soft, nontender, nondistended, positive bowel sounds, no masses, no organomegaly.  Musculoskeletal: Shoulder, elbow, wrist, hip, knee, ankle stable, and with full range of motion.  Impression and Recommendations:    The patient was counselled, risk factors were discussed, anticipatory guidance given.  Annual  physical exam Healthy male, flu and Shingrix today, return in 2 to 6 months nurse visit for Shingrix #2. Return in 1 year to see me.  Chronic cough Essentially resolved now since he has retired, does not need his Singulair anymore, I think all of his symptoms were related to occupational exposure.  Controlled type 2 diabetes mellitus without complication, without long-term current use of insulin (HCC) Hemoglobin A1c is up to 6.6%, this makes him a diabetic now.  This also opened the door for medications to help lower the A1c and help him lose a lot of weight, I would like an appointment to discuss the implications of the new diagnosis and address additional screenings needed.  ___________________________________________ [name]. [name], M.D., ABFM., CAQSM. Primary Care and Sports Medicine [facility]  Adjunct Professor of [place] of [employer] [facility] [employer]

## 2021-04-03 NOTE — Assessment & Plan Note (Signed)
Essentially resolved now since he has retired, does not need his Singulair anymore, I think all of his symptoms were related to occupational exposure.

## 2021-04-03 NOTE — Assessment & Plan Note (Signed)
Healthy male, flu and Shingrix today, return in 2 to 6 months nurse visit for Shingrix #2. Return in 1 year to see me.

## 2021-04-03 NOTE — Addendum Note (Signed)
Addended by: Gust Brooms on: 04/03/2021 03:48 PM   Modules accepted: Orders

## 2021-04-16 DIAGNOSIS — R739 Hyperglycemia, unspecified: Secondary | ICD-10-CM | POA: Insufficient documentation

## 2021-04-16 NOTE — Addendum Note (Signed)
Addended by: Silverio Decamp on: 04/16/2021 07:18 PM   Modules accepted: Orders

## 2021-04-17 LAB — TEST AUTHORIZATION

## 2021-04-17 LAB — LIPID PANEL
Cholesterol: 125 mg/dL (ref <200)
HDL: 33 mg/dL — ABNORMAL LOW (ref >=40)
LDL Cholesterol (Calc): 67 mg/dL (calc)
Non-HDL Cholesterol (Calc): 92 mg/dL (calc) (ref <130)
Total CHOL/HDL Ratio: 3.8 (calc) (ref <5.0)
Triglycerides: 177 mg/dL — ABNORMAL HIGH (ref <150)

## 2021-04-17 LAB — COMPREHENSIVE METABOLIC PANEL
AG Ratio: 1.3 (calc) (ref 1.0–2.5)
ALT: 34 U/L (ref 9–46)
AST: 34 U/L (ref 10–35)
Albumin: 4.1 g/dL (ref 3.6–5.1)
Alkaline phosphatase (APISO): 87 U/L (ref 35–144)
BUN: 13 mg/dL (ref 7–25)
CO2: 23 mmol/L (ref 20–32)
Calcium: 9.5 mg/dL (ref 8.6–10.3)
Chloride: 106 mmol/L (ref 98–110)
Creat: 0.83 mg/dL (ref 0.70–1.30)
Globulin: 3.2 g/dL (calc) (ref 1.9–3.7)
Glucose, Bld: 133 mg/dL — ABNORMAL HIGH (ref 65–99)
Potassium: 4.4 mmol/L (ref 3.5–5.3)
Sodium: 139 mmol/L (ref 135–146)
Total Bilirubin: 0.4 mg/dL (ref 0.2–1.2)
Total Protein: 7.3 g/dL (ref 6.1–8.1)

## 2021-04-17 LAB — TSH: TSH: 3.91 mIU/L (ref 0.40–4.50)

## 2021-04-17 LAB — HEMOGLOBIN A1C
Hgb A1c MFr Bld: 6.6 % of total Hgb — ABNORMAL HIGH (ref <5.7)
Mean Plasma Glucose: 143 mg/dL
eAG (mmol/L): 7.9 mmol/L

## 2021-04-17 LAB — CBC
HCT: 46.1 % (ref 38.5–50.0)
Hemoglobin: 15.4 g/dL (ref 13.2–17.1)
MCH: 29.2 pg (ref 27.0–33.0)
MCHC: 33.4 g/dL (ref 32.0–36.0)
MCV: 87.5 fL (ref 80.0–100.0)
MPV: 10 fL (ref 7.5–12.5)
Platelets: 322 10*3/uL (ref 140–400)
RBC: 5.27 10*6/uL (ref 4.20–5.80)
RDW: 12.2 % (ref 11.0–15.0)
WBC: 9.7 10*3/uL (ref 3.8–10.8)

## 2021-04-17 LAB — PSA, TOTAL AND FREE
PSA, % Free: 67 % (calc) (ref >25)
PSA, Free: 0.2 ng/mL
PSA, Total: 0.3 ng/mL (ref <=4.0)

## 2021-04-18 ENCOUNTER — Encounter: Payer: Self-pay | Admitting: Sports Medicine

## 2021-04-18 DIAGNOSIS — E119 Type 2 diabetes mellitus without complications: Secondary | ICD-10-CM | POA: Insufficient documentation

## 2021-04-18 HISTORY — DX: Type 2 diabetes mellitus without complications: E11.9

## 2021-04-18 NOTE — Assessment & Plan Note (Signed)
Hemoglobin A1c is up to 6.6%, this makes him a diabetic now.  This also opened the door for medications to help lower the A1c and help him lose a lot of weight, I would like an appointment to discuss the implications of the new diagnosis and address additional screenings needed.

## 2021-04-24 ENCOUNTER — Ambulatory Visit: Payer: No Typology Code available for payment source | Admitting: Sports Medicine

## 2021-04-24 ENCOUNTER — Other Ambulatory Visit: Payer: Self-pay

## 2021-04-24 DIAGNOSIS — E119 Type 2 diabetes mellitus without complications: Secondary | ICD-10-CM | POA: Diagnosis not present

## 2021-04-24 LAB — POCT UA - MICROALBUMIN
Albumin/Creatinine Ratio, Urine, POC: <30
Creatinine, POC: 300 mg/dL
Microalbumin Ur, POC: 10 mg/L

## 2021-04-24 MED ORDER — SEMAGLUTIDE 7 MG PO TABS: 1.0000 | ORAL_TABLET | Freq: Every day | ORAL | 0 refills | Status: AC

## 2021-04-24 MED ORDER — METFORMIN HCL ER 500 MG PO TB24: 500.0000 mg | ORAL_TABLET | Freq: Every day | ORAL | 11 refills | Status: DC

## 2021-04-24 MED ORDER — SEMAGLUTIDE 14 MG PO TABS: 1.0000 | ORAL_TABLET | Freq: Every day | ORAL | 11 refills | Status: DC

## 2021-04-24 MED ORDER — RYBELSUS 3 MG PO TABS: 1.0000 | ORAL_TABLET | Freq: Every day | ORAL | 0 refills | Status: AC

## 2021-04-24 NOTE — Patient Instructions (Signed)
Rybelsus will start at 3 mg for a month then 7 mg for a month then 14 mg

## 2021-04-24 NOTE — Progress Notes (Signed)
° ° °  Procedures performed today:    None.  Independent interpretation of notes and tests performed by another provider:   None.  Brief History, Exam, Impression, and Recommendations:    Controlled type 2 diabetes mellitus without complication, without long-term current use of insulin (HCC) New diagnosis of diabetes type 2 with A1c of 6.6%, we will add metformin 500 XR, as well as Rybelsus. We can get him stabilized for about 6 months and then discontinue medications. He has need to lose about 30 to 40 pounds. He will need a diabetic eye exam, urine microalbumin, and diabetic foot exam. Return to see me in about 3 months.  Chronic process with exacerbation and pharmacologic intervention  ___________________________________________ [name]. [name], M.D., ABFM., CAQSM. Primary Care and [place] Instructor of [place] of [facility] of Medicine

## 2021-04-24 NOTE — Assessment & Plan Note (Signed)
New diagnosis of diabetes type 2 with A1c of 6.6%, we will add metformin 500 XR, as well as Rybelsus. We can get him stabilized for about 6 months and then discontinue medications. He has need to lose about 30 to 40 pounds. He will need a diabetic eye exam, urine microalbumin, and diabetic foot exam. Return to see me in about 3 months.

## 2021-04-24 NOTE — Addendum Note (Signed)
Addended by: Dema Severin on: 04/24/2021 02:44 PM   Modules accepted: Orders

## 2021-04-26 LAB — HM DIABETES EYE EXAM

## 2021-04-27 ENCOUNTER — Encounter: Payer: Self-pay | Admitting: Sports Medicine

## 2021-06-20 ENCOUNTER — Other Ambulatory Visit: Payer: Self-pay

## 2021-06-20 ENCOUNTER — Ambulatory Visit (INDEPENDENT_AMBULATORY_CARE_PROVIDER_SITE_OTHER): Payer: No Typology Code available for payment source | Admitting: Sports Medicine

## 2021-06-20 VITALS — Temp 98.1°F

## 2021-06-20 DIAGNOSIS — Z23 Encounter for immunization: Secondary | ICD-10-CM

## 2021-06-20 NOTE — Progress Notes (Signed)
Established Patient Office Visit  Subjective:  Patient ID: [name], male    DOB: [birth year]  Age: 56 y.o. MRN: [id number]  CC:  Chief Complaint  Patient presents with   Immunizations    HPI [name] presents for last shingles vaccine.   Past Medical History:  Diagnosis Date   Allergy    Anemia    CAD (coronary artery disease) of artery bypass graft 2009   Status post bypass grafting 2009 , status post stress echo 2012 no ischemia.   Controlled type 2 diabetes mellitus without complication, without long-term current use of insulin ([place]) 04/18/2021   Depression    past hx after heart surgery    Dermatitis    Left upper extremity   Diverticulitis 07/11/2017   Status post percutaneous drainage catheter placement for treatment   Diverticulosis    Severe in sigmoid colon, Status post percutaneous drainage catheter placement for treatment   Erectile dysfunction    History of diverticular abscess 07/11/2017   Status post percutaneous drainage catheter placement for treatment, 07/30/17 drain removed   History of kidney infection 1990   Hx of adenomatous colonic polyps 09/02/2014   Hyperlipidemia, mixed    Hypotension, unspecified    Myocardial infarction ([place])    per pt had 2 MI- 2009 both    Numbness and tingling in left hand    S/p graft for CABG   Sleep apnea    On 8 cm of water CPAP    Past Surgical History:  Procedure Laterality Date   BICEPT TENODESIS Left 07/09/2019   Procedure: BICEPS TENODESIS;  Surgeon: Hiram Gash, MD;  Location: [facility];  Service: Orthopedics;  Laterality: Left;   COLONOSCOPY  2016   COLONOSCOPY  2007   COLONOSCOPY W/ POLYPECTOMY  10/07/2017   CORONARY ARTERY BYPASS GRAFT  2009   x4   CYSTOSCOPY W/ RETROGRADES Bilateral 11/27/2017   Procedure: BILATERAL CYSTOSCOPY WITH RETROGRADE PYELOGRAM, BILATERAL URETERAL CATHETERIZATION;  Surgeon: Lucas Mallow, MD;  [facility];  Service: Urology;   Laterality: Bilateral;   FLEXIBLE SIGMOIDOSCOPY N/A 11/27/2017   Procedure: FLEXIBLE SIGMOIDOSCOPY;  Surgeon: Ileana Roup, MD;  [facility];  Service: General;  Laterality: N/A;   IR RADIOLOGIST EVAL & MGMT  07/30/2017   LAPAROSCOPIC SIGMOID COLECTOMY N/A 11/27/2017   Procedure: LAPAROSCOPIC SIGMOIDECTOMY;  Surgeon: Ileana Roup, MD;  Location[facility];  Service: General;  Laterality: N/A;   SHOULDER ARTHROSCOPY Left 11/19/2019   Procedure: ARTHROSCOPY LEFT SHOULDER DEBRIDEMENT WITH LYSIS RESECT ADHESION WITH MANIPULATION;  Surgeon: Hiram Gash, MD;  Location: [facility];  Service: Orthopedics;  Laterality: Left;    Family History  Problem Relation Age of Onset   Heart disease Mother    Heart disease Father    Colon cancer Paternal Uncle    Kidney disease Other    Coronary artery disease Other    Heart failure Other    Esophageal cancer Neg Hx    Prostate cancer Neg Hx    Rectal cancer Neg Hx    Stomach cancer Neg Hx    Colon polyps Neg Hx     Social History   Socioeconomic History   Marital status: Married    Spouse name: Not on file   Number of children: Not on file   Years of education: Not on file   Highest education level: Not on file  Occupational History   Occupation: Full time  Employer: [employer]  Tobacco Use   Smoking status: Former    Packs/day: 0.20    Years: 5.00    Pack years: 1.00    Types: Cigarettes, Cigars    Start date: 05/28/2000    Quit date: 05/07/2005    Years since quitting: 16.1   Smokeless tobacco: Former    Types: Chew    Quit date: 05/07/1997   Tobacco comments:    chewed tobacco x 20 yrs - quit 2000  Vaping Use   Vaping Use: Never used  Substance and Sexual Activity   Alcohol use: Yes    Alcohol/week: 15.0 standard drinks    Types: 10 Shots of liquor, 5 Standard drinks or equivalent per week    Comment: beer & liquor   Drug use: No   Sexual activity: Yes    Partners: Female  Other Topics  Concern   Not on file  Social History Narrative   Not on file   Social Determinants of Health   Financial Resource Strain: Not on file  Food Insecurity: Not on file  Transportation Needs: Not on file  Physical Activity: Not on file  Stress: Not on file  Social Connections: Not on file  Intimate Partner Violence: Not on file    Outpatient Medications Prior to Visit  Medication Sig Dispense Refill   aspirin 81 MG tablet Take 81 mg by mouth every evening.     Calcium Carbonate-Vitamin D 500-125 MG-UNIT TABS Take by mouth.     cholecalciferol (VITAMIN D) 1000 units tablet Take 1,000 Units by mouth every evening.     metFORMIN (GLUCOPHAGE XR) 500 MG 24 hr tablet Take 1 tablet (500 mg total) by mouth daily with breakfast. 30 tablet 11   montelukast (SINGULAIR) 10 MG tablet Take 1 tablet (10 mg total) by mouth at bedtime. 90 tablet 3   Multiple Vitamin (MULTIVITAMIN) tablet Take 1 tablet by mouth every evening.      Omega-3 Fatty Acids (FISH OIL) 1200 MG CAPS Take 1,200 mg by mouth every evening.     rosuvastatin (CRESTOR) 40 MG tablet TAKE 1 TABLET DAILY 90 tablet 1   Semaglutide 14 MG TABS Take 1 tablet by mouth daily. 30 tablet 11   No facility-administered medications prior to visit.    Allergies  Allergen Reactions   Oxycodone Hives and Other (See Comments)    fever   Sulfonamide Derivatives Hives, Shortness Of Breath and Rash    ROS Review of Systems    Objective:    Physical Exam  Temp 98.1 F (36.7 C) (Temporal)  Wt Readings from Last 3 Encounters:  04/24/21 246 lb 1.3 oz (111.6 kg)  04/03/21 245 lb 0.6 oz (111.1 kg)  01/11/21 242 lb 0.6 oz (109.8 kg)     Health Maintenance Due  Topic Date Due   COVID-19 Vaccine (1) Never done   Hepatitis C Screening  Never done   Zoster Vaccines- Shingrix (2 of 2) 05/29/2021    There are no preventive care reminders to display for this patient.  Lab Results  Component Value Date   TSH 3.91 04/14/2021   Lab Results   Component Value Date   WBC 9.7 04/14/2021   HGB 15.4 04/14/2021   HCT 46.1 04/14/2021   MCV 87.5 04/14/2021   PLT 322 04/14/2021   Lab Results  Component Value Date   NA 139 04/14/2021   K 4.4 04/14/2021   CO2 23 04/14/2021   GLUCOSE 133 (H) 04/14/2021   BUN  13 04/14/2021   CREATININE 0.83 04/14/2021   BILITOT 0.4 04/14/2021   ALKPHOS 63 09/01/2019   AST 34 04/14/2021   ALT 34 04/14/2021   PROT 7.3 04/14/2021   ALBUMIN 4.7 09/01/2019   CALCIUM 9.5 04/14/2021   ANIONGAP 10 09/01/2019   EGFR 104 01/11/2021   Lab Results  Component Value Date   CHOL 125 04/14/2021   Lab Results  Component Value Date   HDL 33 (L) 04/14/2021   Lab Results  Component Value Date   LDLCALC 67 04/14/2021   Lab Results  Component Value Date   TRIG 177 (H) 04/14/2021   Lab Results  Component Value Date   CHOLHDL 3.8 04/14/2021   Lab Results  Component Value Date   HGBA1C 6.6 (H) 04/14/2021      Assessment & Plan:  Shingles vaccine - Patient tolerated injection well without complications.    Problem List Items Addressed This Visit   None Visit Diagnoses     Need for shingles vaccine    -  Primary   Relevant Orders   Varicella-zoster vaccine IM (Shingrix)       No orders of the defined types were placed in this encounter.   Follow-up: No follow-ups on file.    Lavell Luster, [place]

## 2021-06-29 ENCOUNTER — Telehealth (INDEPENDENT_AMBULATORY_CARE_PROVIDER_SITE_OTHER): Payer: No Typology Code available for payment source | Admitting: Sports Medicine

## 2021-06-29 DIAGNOSIS — K5720 Diverticulitis of large intestine with perforation and abscess without bleeding: Secondary | ICD-10-CM | POA: Diagnosis not present

## 2021-06-29 DIAGNOSIS — R110 Nausea: Secondary | ICD-10-CM | POA: Diagnosis not present

## 2021-06-29 MED ORDER — PROCHLORPERAZINE MALEATE 10 MG PO TABS: 10.0000 mg | ORAL_TABLET | Freq: Four times a day (QID) | ORAL | 3 refills | Status: DC | PRN

## 2021-06-29 MED ORDER — POLYETHYLENE GLYCOL 3350 17 G PO PACK: 17.0000 g | PACK | Freq: Two times a day (BID) | ORAL | 11 refills | Status: DC

## 2021-06-29 MED ORDER — METRONIDAZOLE 500 MG PO TABS: 500.0000 mg | ORAL_TABLET | Freq: Two times a day (BID) | ORAL | 0 refills | Status: AC

## 2021-06-29 MED ORDER — CIPROFLOXACIN HCL 750 MG PO TABS: 750.0000 mg | ORAL_TABLET | Freq: Two times a day (BID) | ORAL | 0 refills | Status: AC

## 2021-06-29 NOTE — Progress Notes (Signed)
° °  Virtual Visit via Telephone   I connected with  [name]  on 06/29/21 by telephone/telehealth and verified that I am speaking with the correct person using two identifiers.   I discussed the limitations, risks, security and privacy concerns of performing an evaluation and management service by telephone, including the higher likelihood of inaccurate diagnosis and treatment, and the availability of in person appointments.  We also discussed the likely need of an additional face to face encounter for complete and high quality delivery of care.  I also discussed with the patient that there may be a patient responsible charge related to this service. The patient expressed understanding and wishes to proceed.  Provider location is in medical facility. Patient location is at their home, different from provider location. People involved in care of the patient during this telehealth encounter were myself, my nurse/medical assistant, and my front office/scheduling team member.  Review of Systems: No fevers, chills, night sweats, weight loss, chest pain, or shortness of breath.   Objective Findings:    General: Speaking full sentences, no audible heavy breathing.  Sounds alert and appropriately interactive.    Independent interpretation of tests performed by another provider:   None.  Brief History, Exam, Impression, and Recommendations:    Nausea This is a pleasant 56 year old male, he has had several days of increasing nausea, abdominal pain. He does have a history of sigmoid diverticulitis with perforation and abscess post hemicolectomy. Symptoms feel similar though his abdominal pain is not quite as bad as it was when he had his abscess. We will treat him aggressively, Cipro, Flagyl, Compazine for nausea. He endorses not stooling since Monday so we will also add some MiraLAX. He is still passing gas so he is not obstipated. If he has not passed good stool, having improvements in  symptoms, or stops passing gas over the weekend then he needs to see me stat.   I discussed the above assessment and treatment plan with the patient. The patient was provided an opportunity to ask questions and all were answered. The patient agreed with the plan and demonstrated an understanding of the instructions.   The patient was advised to call back or seek an in-person evaluation if the symptoms worsen or if the condition fails to improve as anticipated.   I provided 30 minutes of verbal and non-verbal time during this encounter date, time was needed to gather information, review chart, records, communicate/coordinate with staff remotely, as well as complete documentation.   ___________________________________________ [name]. [name], M.D., ABFM., CAQSM. Primary Care and Sports Medicine [facility]  Adjunct Professor of [place] of [facility] of [employer]

## 2021-06-29 NOTE — Assessment & Plan Note (Signed)
This is a pleasant 56 year old male, he has had several days of increasing nausea, abdominal pain. He does have a history of sigmoid diverticulitis with perforation and abscess post hemicolectomy. Symptoms feel similar though his abdominal pain is not quite as bad as it was when he had his abscess. We will treat him aggressively, Cipro, Flagyl, Compazine for nausea. He endorses not stooling since Monday so we will also add some MiraLAX. He is still passing gas so he is not obstipated. If he has not passed good stool, having improvements in symptoms, or stops passing gas over the weekend then he needs to see me stat.

## 2021-08-02 ENCOUNTER — Other Ambulatory Visit: Payer: Self-pay | Admitting: Cardiology

## 2021-10-12 ENCOUNTER — Encounter: Payer: Self-pay | Admitting: Cardiology

## 2021-10-12 ENCOUNTER — Ambulatory Visit (INDEPENDENT_AMBULATORY_CARE_PROVIDER_SITE_OTHER): Payer: No Typology Code available for payment source | Admitting: Cardiology

## 2021-10-12 VITALS — BP 116/70 | HR 86 | Ht 75.0 in | Wt 236.2 lb

## 2021-10-12 DIAGNOSIS — G4730 Sleep apnea, unspecified: Secondary | ICD-10-CM

## 2021-10-12 DIAGNOSIS — E782 Mixed hyperlipidemia: Secondary | ICD-10-CM

## 2021-10-12 DIAGNOSIS — I2510 Atherosclerotic heart disease of native coronary artery without angina pectoris: Secondary | ICD-10-CM

## 2021-10-12 DIAGNOSIS — I10 Essential (primary) hypertension: Secondary | ICD-10-CM | POA: Diagnosis not present

## 2021-10-12 NOTE — Patient Instructions (Addendum)
Medication Instructions:  Your physician recommends that you continue on your current medications as directed. Please refer to the Current Medication list given to you today.  Labwork: none  Testing/Procedures: none  Follow-Up: Your physician recommends that you schedule a follow-up appointment in: 1 year. You will receive a reminder call in the mail in about 10 months reminding you to call and schedule your appointment. If you don't receive this call, please contact our office.  Any Other Special Instructions Will Be Listed Below (If Applicable). You have been referred to [facility]  If you need a refill on your cardiac medications before your next appointment, please call your pharmacy.

## 2021-10-12 NOTE — Progress Notes (Signed)
Clinical Summary [name] is a 56 y.o.male seen today for follow up of the following medical problems.    1. CAD - CABG in 2009 4 vessel (LIMA-LAD, left radial to OM, SVG-diag, SVG-RCA) - 2012 DSE with baseline LVEF 55-60%, no ischemia - no chest pain. No SOB or DOE.  -meds limited due to low bp's, has not been on beta blocker or ACE-I     - no chest pains, no SOB/DOE   2. Hyperlipidemia  - compliant with statin   Jan 2020 TC 139 HDL 38 TG 107 LDL 81 04/2021 TC 125 TG 177 HDL 33 LDL 67 - we had tried zetia few years, thinks he had side effects and now off    3. OSA - on CPAP, has had trouble establishing for follow up for his OSA - mixed compliance with cpap - overdue for follow up    4. 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   Controlled type 2 diabetes mellitus without complication, without long-term current use of insulin ([place]) 04/18/2021   Depression    past hx after heart surgery    Dermatitis    Left upper extremity   Diverticulitis 07/11/2017   Status post percutaneous drainage catheter placement for treatment   Diverticulosis    Severe in sigmoid colon, Status post percutaneous drainage catheter placement for treatment   Erectile dysfunction    History of diverticular abscess 07/11/2017   Status post percutaneous drainage catheter placement for treatment, 07/30/17 drain removed   History of kidney infection 1990   Hx of adenomatous colonic polyps 09/02/2014   Hyperlipidemia, mixed    Hypotension, unspecified    Myocardial infarction ([place])    per pt had 2 MI- 2009 both    Numbness  and tingling in left hand    S/p graft for CABG   Sleep apnea    On 8 cm of water CPAP     Allergies  Allergen Reactions   Oxycodone Hives and Other (See Comments)    fever   Sulfonamide Derivatives Hives, Shortness Of Breath and Rash     Current Outpatient Medications  Medication Sig Dispense Refill   aspirin 81 MG tablet Take 81 mg by mouth every evening.     Calcium Carbonate-Vitamin D 500-125 MG-UNIT TABS Take by mouth.     cholecalciferol (VITAMIN D) 1000 units tablet Take 1,000 Units by mouth every evening.     metFORMIN (GLUCOPHAGE XR) 500 MG 24 hr tablet Take 1 tablet (500 mg total) by mouth daily with breakfast. 30 tablet 11   montelukast (SINGULAIR) 10 MG tablet Take 1 tablet (10 mg total) by mouth at bedtime. 90 tablet 3   Multiple Vitamin (MULTIVITAMIN) tablet Take 1 tablet by mouth every evening.      Omega-3 Fatty Acids (FISH OIL) 1200 MG CAPS Take 1,200 mg by mouth every evening.     prochlorperazine (COMPAZINE) 10 MG tablet Take 1 tablet (10 mg total) by mouth every 6 (six) hours as needed for nausea or vomiting. 30 tablet 3  rosuvastatin (CRESTOR) 40 MG tablet TAKE 1 TABLET DAILY 90 tablet 1   Semaglutide 14 MG TABS Take 1 tablet by mouth daily. 30 tablet 11   No current facility-administered medications for this visit.     Past Surgical History:  Procedure Laterality Date   BICEPT TENODESIS Left 07/09/2019   Procedure: BICEPS TENODESIS;  Surgeon: Hiram Gash, MD;  Location: [facility];  Service: Orthopedics;  Laterality: Left;   COLONOSCOPY  2016   COLONOSCOPY  2007   COLONOSCOPY W/ POLYPECTOMY  10/07/2017   CORONARY ARTERY BYPASS GRAFT  2009   x4   CYSTOSCOPY W/ RETROGRADES Bilateral 11/27/2017   Procedure: BILATERAL CYSTOSCOPY WITH RETROGRADE PYELOGRAM, BILATERAL URETERAL CATHETERIZATION;  Surgeon: Lucas Mallow, MD;  Location: [facility];  Service: Urology;  Laterality: Bilateral;   FLEXIBLE SIGMOIDOSCOPY N/A 11/27/2017   Procedure:  FLEXIBLE SIGMOIDOSCOPY;  Surgeon: Ileana Roup, MD;  Location: [facility];  Service: General;  Laterality: N/A;   IR RADIOLOGIST EVAL & MGMT  07/30/2017   LAPAROSCOPIC SIGMOID COLECTOMY N/A 11/27/2017   Procedure: LAPAROSCOPIC SIGMOIDECTOMY;  Surgeon: Ileana Roup, MD;  Location: [facility];  Service: General;  Laterality: N/A;   SHOULDER ARTHROSCOPY Left 11/19/2019   Procedure: ARTHROSCOPY LEFT SHOULDER DEBRIDEMENT WITH LYSIS RESECT ADHESION WITH MANIPULATION;  Surgeon: Hiram Gash, MD;  Location: [facility];  Service: Orthopedics;  Laterality: Left;     Allergies  Allergen Reactions   Oxycodone Hives and Other (See Comments)    fever   Sulfonamide Derivatives Hives, Shortness Of Breath and Rash      Family History  Problem Relation Age of Onset   Heart disease Mother    Heart disease Father    Colon cancer Paternal Uncle    Kidney disease Other    Coronary artery disease Other    Heart failure Other    Esophageal cancer Neg Hx    Prostate cancer Neg Hx    Rectal cancer Neg Hx    Stomach cancer Neg Hx    Colon polyps Neg Hx      Social History [name] reports that he quit smoking about 16 years ago. His smoking use included cigarettes and cigars. He started smoking about 21 years ago. He has a 1.00 pack-year smoking history. He quit smokeless tobacco use about 24 years ago.  His smokeless tobacco use included chew. [name] reports current alcohol use of about 15.0 standard drinks of alcohol per week.   Review of Systems CONSTITUTIONAL: No weight loss, fever, chills, weakness or fatigue.  HEENT: Eyes: No visual loss, blurred vision, double vision or yellow sclerae.No hearing loss, sneezing, congestion, runny nose or sore throat.  SKIN: No rash or itching.  CARDIOVASCULAR: per hpi RESPIRATORY: No shortness of breath, cough or sputum.  GASTROINTESTINAL: No anorexia, nausea, vomiting or diarrhea. No abdominal pain or blood.  GENITOURINARY: No  burning on urination, no polyuria NEUROLOGICAL: No headache, dizziness, syncope, paralysis, ataxia, numbness or tingling in the extremities. No change in bowel or bladder control.  MUSCULOSKELETAL: No muscle, back pain, joint pain or stiffness.  LYMPHATICS: No enlarged nodes. No history of splenectomy.  PSYCHIATRIC: No history of depression or anxiety.  ENDOCRINOLOGIC: No reports of sweating, cold or heat intolerance. No polyuria or polydipsia.  [name]   Physical Examination Today's Vitals   10/12/21 0828  BP: 116/70  Pulse: 86  SpO2: 97%  Weight: 236 lb 3.2 oz (107.1 kg)  Height: '6\' 3"'$  (1.905 m)  Body mass index is 29.52 kg/m.  Gen: resting comfortably, no acute distress HEENT: no scleral icterus, pupils equal round and reactive, no palptable cervical adenopathy,  CV: RRR, no m/r/g, no jvd Resp: Clear to auscultation bilaterally GI: abdomen is soft, non-tender, non-distended, normal bowel sounds, no hepatosplenomegaly MSK: extremities are warm, no edema.  Skin: warm, no rash Neuro:  no focal deficits Psych: appropriate affect   Diagnostic Studies  12/2007  ANGIOGRAPHIC DATA:   1. The left main is free of critical disease.   2. The LAD courses to the apex. The LAD has multiple lesions   including a 70% proximal mid and 80% lesion at the takeoff of the   third diagonal. There were then in the distal vessel multiple   lesions including a 90, 70, 50, 90, and 90 in sequence. There is a   70% apical stenosis as well. The first diagonal Xylon Croom is small-to-   moderate in size with 90% ostial narrowing. Second diagonal is   fairly large in size with 80% proximal and 80% mid narrowing.   Second diagonal does appear to be graftable. The third diagonal   has 90% ostial narrowing.   3. The circumflex has a tiny second marginal with 90% narrowing that   was totally occluded and it started filling late by collaterals.   4. The right coronary artery is totally occluded proximally and  fills   in by left-to-right collaterals.   5. Ventriculography in the RAO projection reveals estimated ejection   fraction of 45-50%. There is inferior wall motion abnormality in   the mid inferior wall corresponding the circumflex territory and   anteroapical abnormality, that is mild.   CONCLUSIONS:   1. Total occlusion of the circumflex and right coronary arteries.   2. Minimum 8 successive lesions in the left anterior descending   artery, and including high-grade disease of the large second   diagonal Tramel Westbrook as noted above.   3. Progressive symptoms compatible with angina pectoris.   RECOMMENDATIONS: The LAD is not ideal for grafting. Nonetheless, the   apical vessel possibly could be grafted as well as the large diagonal.   In addition, the marginal and the RCA also appear to be optimal for   grafting. Surgical consultation will be obtained.   Assessment and Plan   1. CAD -no symptoms, continue current meds - EKG today shows NSR   2. Hyperlipidemia -at goal, continue crestor   3. OSA - refer to [facility], has not been evaluated in few years. He has some interest in inspire device as well as difficultly tolerating cpap. Would like referal to [facility], we will place     Arnoldo Lenis, M.D.

## 2021-11-06 ENCOUNTER — Encounter: Payer: Self-pay | Admitting: Pulmonary Disease

## 2021-11-06 ENCOUNTER — Ambulatory Visit (INDEPENDENT_AMBULATORY_CARE_PROVIDER_SITE_OTHER): Payer: No Typology Code available for payment source | Admitting: Pulmonary Disease

## 2021-11-06 VITALS — BP 120/78 | HR 103 | Temp 98.1°F | Ht 75.0 in | Wt 236.8 lb

## 2021-11-06 DIAGNOSIS — R0683 Snoring: Secondary | ICD-10-CM | POA: Diagnosis not present

## 2021-11-06 NOTE — Progress Notes (Signed)
[facility]  Chief Complaint  Patient presents with   Consult    He was on a CPAP for a while, and wants to talk about other treatments.     Past Surgical History:  He  has a past surgical history that includes Coronary artery bypass graft (2009); IR Radiologist Eval & Mgmt (07/30/2017); Colonoscopy (2016); Colonoscopy w/ polypectomy (10/07/2017); Colonoscopy (2007); Laparoscopic sigmoid colectomy (N/A, 11/27/2017); Flexible sigmoidoscopy (N/A, 11/27/2017); Cystoscopy w/ retrogrades (Bilateral, 11/27/2017); Bicept tenodesis (Left, 07/09/2019); and Shoulder arthroscopy (Left, 11/19/2019).  Past Medical History:  Allergies, CAD s/p CABG, DM type 2, Depression, Diverticulitis, ED, Colon polyps, HLD  Constitutional:  BP 120/78 (BP Location: Right Arm, Cuff Size: Normal)   Pulse (!) 103   Temp 98.1 F (36.7 C) (Oral)   Ht '6\' 3"'$  (1.905 m)   Wt 236 lb 12.8 oz (107.4 kg)   SpO2 96%   BMI 29.60 kg/m   Brief Summary:  [name] is a 56 y.o. male former smoker with obstructive sleep apnea      Subjective:   He had a sleep study about 13 years ago.  Was on CPAP.  He lost about 20 lbs.  It got cumbersome to travel with CPAP, and he had trouble with mask fit and pressure setting.  He hasn't used CPAP for the past few years.  His wife says he still snores.  He will fall asleep during the day at time if he sits quiet.  He goes to sleep at 12 am.  He falls asleep in 5 minutes.  He wakes up some times to use the bathroom.  He gets out of bed at 6 am.  He feels okay in the morning.  He denies morning headache.  He does not use anything to help him fall sleep or stay awake.  He denies sleep walking, sleep talking, bruxism, or nightmares.  There is no history of restless legs.  He denies sleep hallucinations, sleep paralysis, or cataplexy.  The Epworth score is 10 out of 24.   Physical Exam:   Appearance - well kempt   ENMT - no sinus  tenderness, no oral exudate, no LAN, Mallampati 3 airway, no stridor  Respiratory - equal breath sounds bilaterally, no wheezing or rales  CV - s1s2 regular rate and rhythm, no murmurs  Ext - no clubbing, no edema  Skin - no rashes  Psych - normal mood and affect   Sleep Tests:    Social History:  He  reports that he quit smoking about 16 years ago. His smoking use included cigarettes and cigars. He started smoking about 21 years ago. He has a 1.00 pack-year smoking history. He quit smokeless tobacco use about 24 years ago.  His smokeless tobacco use included chew. He reports current alcohol use of about 15.0 standard drinks of alcohol per week. He reports that he does not use drugs.  Family History:  His family history includes Colon cancer in his paternal uncle; Coronary artery disease in an other family member; Heart disease in his father and mother; Heart failure in an other family member; Kidney disease in an other family member.    Discussion:  He has snoring, sleep disruption, apnea and daytime sleepiness.  He has history of coronary artery disease, depression, and diabetes mellitus type 2.  He has prior history of sleep apnea, and I am concerned he could still have sleep apnea.  He might be interested in learning more  about an oral appliance or Inspire device if he still has sleep apnea.  Assessment/Plan:   Snoring with excessive daytime sleepiness. - will need to arrange for a home sleep study  Obesity. - discussed how weight can impact sleep and risk for sleep disordered breathing - discussed options to assist with weight loss: combination of diet modification, cardiovascular and strength training exercises  Cardiovascular risk. - had an extensive discussion regarding the adverse health consequences related to untreated sleep disordered breathing - specifically discussed the risks for hypertension, coronary artery disease, cardiac dysrhythmias, cerebrovascular disease,  and diabetes - lifestyle modification discussed  Safe driving practices. - discussed how sleep disruption can increase risk of accidents, particularly when driving - safe driving practices were discussed  Therapies for obstructive sleep apnea. - if the sleep study shows significant sleep apnea, then various therapies for treatment were reviewed: CPAP, oral appliance, and surgical interventions  Time Spent Involved in Patient Care on Day of Examination:  38 minutes  Follow up:   Patient Instructions  Will arrange for a home sleep study Will call to arrange for follow up after sleep study reviewed   Medication List:   Allergies as of 11/06/2021       Reactions   Oxycodone Hives, Other (See Comments)   fever   Sulfonamide Derivatives Hives, Shortness Of Breath, Rash        Medication List        Accurate as of November 06, 2021  3:33 PM. If you have any questions, ask your nurse or doctor.          aspirin 81 MG tablet Take 81 mg by mouth every evening.   Calcium Carbonate-Vitamin D 500-125 MG-UNIT Tabs Take by mouth.   cholecalciferol 1000 units tablet Commonly known as: VITAMIN D Take 1,000 Units by mouth every evening.   Fish Oil 1200 MG Caps Take 1,200 mg by mouth every evening.   metFORMIN 500 MG 24 hr tablet Commonly known as: Glucophage XR Take 1 tablet (500 mg total) by mouth daily with breakfast.   montelukast 10 MG tablet Commonly known as: SINGULAIR Take 1 tablet (10 mg total) by mouth at bedtime.   multivitamin tablet Take 1 tablet by mouth every evening.   rosuvastatin 40 MG tablet Commonly known as: CRESTOR TAKE 1 TABLET DAILY        Signature:  Chesley Mires, MD [place] Pager - [phone] 11/06/2021, 3:33 PM

## 2021-11-06 NOTE — Patient Instructions (Signed)
Will arrange for a home sleep study Will call to arrange for follow up after sleep study reviewed  

## 2021-12-19 ENCOUNTER — Telehealth: Payer: Self-pay | Admitting: Pulmonary Disease

## 2021-12-19 NOTE — Telephone Encounter (Signed)
Ok thank you will call patient

## 2021-12-25 ENCOUNTER — Ambulatory Visit: Payer: No Typology Code available for payment source

## 2021-12-25 DIAGNOSIS — R0683 Snoring: Secondary | ICD-10-CM

## 2022-01-02 ENCOUNTER — Ambulatory Visit: Payer: No Typology Code available for payment source

## 2022-01-07 ENCOUNTER — Other Ambulatory Visit: Payer: Self-pay | Admitting: Cardiology

## 2022-01-25 ENCOUNTER — Ambulatory Visit (INDEPENDENT_AMBULATORY_CARE_PROVIDER_SITE_OTHER): Payer: No Typology Code available for payment source

## 2022-01-29 ENCOUNTER — Telehealth: Payer: Self-pay | Admitting: Pulmonary Disease

## 2022-01-29 DIAGNOSIS — R0683 Snoring: Secondary | ICD-10-CM

## 2022-01-29 NOTE — Telephone Encounter (Signed)
Ok Thank Dr Halford Chessman will do

## 2022-01-29 NOTE — Telephone Encounter (Signed)
Please let him know I have schedule an in lab sleep study pending insurance approval.

## 2022-01-29 NOTE — Telephone Encounter (Signed)
-----   Message from Donne Hazel sent at 01/26/2022  2:56 PM EDT ----- Regarding: HST report Hello Dr Halford Chessman  This patient has tired hst test 3/times I have hst report from 01/02/22 saved Study#[id number] This report  don't show the pulse ox and HR. Per pt he would prefer to go to the sleep lab It you want him to repeat the study to get an complete report.    Please advise  [place]

## 2022-02-07 ENCOUNTER — Other Ambulatory Visit: Payer: Self-pay | Admitting: Sports Medicine

## 2022-02-07 DIAGNOSIS — J0110 Acute frontal sinusitis, unspecified: Secondary | ICD-10-CM

## 2022-04-02 ENCOUNTER — Other Ambulatory Visit: Payer: Self-pay | Admitting: Sports Medicine

## 2022-04-02 DIAGNOSIS — E119 Type 2 diabetes mellitus without complications: Secondary | ICD-10-CM

## 2022-04-24 ENCOUNTER — Encounter (HOSPITAL_BASED_OUTPATIENT_CLINIC_OR_DEPARTMENT_OTHER): Payer: No Typology Code available for payment source | Admitting: Pulmonary Disease

## 2022-05-15 ENCOUNTER — Encounter (HOSPITAL_BASED_OUTPATIENT_CLINIC_OR_DEPARTMENT_OTHER): Payer: No Typology Code available for payment source | Admitting: Pulmonary Disease

## 2022-06-05 ENCOUNTER — Ambulatory Visit (HOSPITAL_BASED_OUTPATIENT_CLINIC_OR_DEPARTMENT_OTHER): Payer: No Typology Code available for payment source | Attending: Pulmonary Disease | Admitting: Pulmonary Disease

## 2022-06-05 DIAGNOSIS — I10 Essential (primary) hypertension: Secondary | ICD-10-CM | POA: Diagnosis not present

## 2022-06-05 DIAGNOSIS — G4733 Obstructive sleep apnea (adult) (pediatric): Secondary | ICD-10-CM | POA: Insufficient documentation

## 2022-06-05 DIAGNOSIS — R0683 Snoring: Secondary | ICD-10-CM

## 2022-06-09 ENCOUNTER — Telehealth: Payer: Self-pay | Admitting: Pulmonary Disease

## 2022-06-09 DIAGNOSIS — R0683 Snoring: Secondary | ICD-10-CM | POA: Diagnosis not present

## 2022-06-09 NOTE — Procedures (Signed)
     Patient Name: [name], [name] Date: 06/05/2022 Gender: Male D.O.B: 08/13/1965 Age (years): 35 Referring Provider: Chesley Mires MD, ABSM Height (inches): 75 Interpreting Physician: Chesley Mires MD, ABSM Weight (lbs): 235 RPSGT: Gwenyth Allegra BMI: 29 MRN: [id number] Neck Size: 17.00  CLINICAL INFORMATION Sleep Study Type: NPSG  Indication for sleep study: Hypertension, OSA  Epworth Sleepiness Score: 6  SLEEP STUDY TECHNIQUE As per the AASM Manual for the Scoring of Sleep and Associated Events v2.3 (April 2016) with a hypopnea requiring 4% desaturations.  The channels recorded and monitored were frontal, central and occipital EEG, electrooculogram (EOG), submentalis EMG (chin), nasal and oral airflow, thoracic and abdominal wall motion, anterior tibialis EMG, snore microphone, electrocardiogram, and pulse oximetry.  MEDICATIONS Medications self-administered by patient taken the night of the study : N/A  SLEEP ARCHITECTURE The study was initiated at 11:11:33 PM and ended at 5:12:42 AM.  Sleep onset time was 37.3 minutes and the sleep efficiency was 52.5%. The total sleep time was 189.5 minutes.  Stage REM latency was 173.0 minutes.  The patient spent 14.8% of the night in stage N1 sleep, 74.1% in stage N2 sleep, 0.0% in stage N3 and 11.1% in REM.  Alpha intrusion was absent.  Supine sleep was 0.00%.  RESPIRATORY PARAMETERS The overall apnea/hypopnea index (AHI) was 9.2 per hour. There were 0 total apneas, including 0 obstructive, 0 central and 0 mixed apneas. There were 29 hypopneas and 13 RERAs.  The AHI during Stage REM sleep was 57.1 per hour.  AHI while supine was N/A per hour.  The mean oxygen saturation was 91.8%. The minimum SpO2 during sleep was 84.0%.  Moderate snoring was noted during this study.  CARDIAC DATA The 2 lead EKG demonstrated sinus rhythm. The mean heart rate was 75.2 beats per minute. Other EKG findings include: None.  LEG  MOVEMENT DATA The total PLMS were 0 with a resulting PLMS index of 0.0. Associated arousal with leg movement index was 0.3 .  IMPRESSIONS - Mild obstructive sleep apnea with an AHI of 9.2 and SpO2 low of 84%.  He had a significant REM effect. - The patient snored with moderate snoring volume. - No cardiac abnormalities were noted during this study. - Clinically significant periodic limb movements did not occur during sleep. No significant associated arousals. - He had difficulty with sleep maintenance due to respiratory events.  DIAGNOSIS - Obstructive Sleep Apnea (G47.33)  RECOMMENDATIONS - Additional therapies include CPAP, oral appliance, or surgical assessment. - Avoid alcohol, sedatives and other CNS depressants that may worsen sleep apnea and disrupt normal sleep architecture. - Sleep hygiene should be reviewed to assess factors that may improve sleep quality. - Weight management and regular exercise should be initiated or continued if appropriate.  [Electronically signed] 06/09/2022 04:31 PM  Chesley Mires MD, ABSM Diplomate, American Board of Sleep Medicine NPI: [id number]  [place] PH: [phone]   FX: [phone] [place]

## 2022-06-09 NOTE — Telephone Encounter (Signed)
PSG 06/05/22 >> AHI 9.2, SpO2 low 84%, REM effect, no S   Please inform him that his sleep study shows mild obstructive sleep apnea.  Please arrange for ROV with me or NP to discuss treatment options.

## 2022-06-11 NOTE — Telephone Encounter (Signed)
Spoke with patient regarding HSt results. They verbalized understanding. No further questions.  Nothing further needed at this time.

## 2022-06-16 ENCOUNTER — Other Ambulatory Visit: Payer: Self-pay | Admitting: Cardiology

## 2022-07-12 ENCOUNTER — Ambulatory Visit (HOSPITAL_BASED_OUTPATIENT_CLINIC_OR_DEPARTMENT_OTHER): Payer: No Typology Code available for payment source | Admitting: Pulmonary Disease

## 2022-07-12 ENCOUNTER — Encounter (HOSPITAL_BASED_OUTPATIENT_CLINIC_OR_DEPARTMENT_OTHER): Payer: Self-pay | Admitting: Pulmonary Disease

## 2022-07-12 VITALS — BP 124/78 | HR 82 | Temp 98.2°F | Ht 75.0 in | Wt 246.4 lb

## 2022-07-12 DIAGNOSIS — Z7189 Other specified counseling: Secondary | ICD-10-CM

## 2022-07-12 DIAGNOSIS — G4733 Obstructive sleep apnea (adult) (pediatric): Secondary | ICD-10-CM

## 2022-07-12 NOTE — Progress Notes (Signed)
[facility]  Chief Complaint  Patient presents with   Follow-up    Pt is here to discuss treatment for OSA from recent sleep study.    Past Surgical History:  He  has a past surgical history that includes Coronary artery bypass graft (2009); IR Radiologist Eval & Mgmt (07/30/2017); Colonoscopy (2016); Colonoscopy w/ polypectomy (10/07/2017); Colonoscopy (2007); Laparoscopic sigmoid colectomy (N/A, 11/27/2017); Flexible sigmoidoscopy (N/A, 11/27/2017); Cystoscopy w/ retrogrades (Bilateral, 11/27/2017); Bicept tenodesis (Left, 07/09/2019); and Shoulder arthroscopy (Left, 11/19/2019).  Past Medical History:  Allergies, CAD s/p CABG, DM type 2, Depression, Diverticulitis, ED, Colon polyps, HLD  Constitutional:  BP 124/78 (BP Location: Right Arm, Patient Position: Sitting, Cuff Size: Normal)   Pulse 82   Temp 98.2 F (36.8 C) (Oral)   Ht '6\' 3"'$  (1.905 m)   Wt 246 lb 6.4 oz (111.8 kg)   SpO2 98% Comment: RA  BMI 30.80 kg/m   Brief Summary:  [name] is a 57 y.o. male former smoker with obstructive sleep apnea      Subjective:   Sleep study showed mild sleep apnea overall.  Most of events happened during REM sleep.  He didn't have supine sleep during the study.  He still has his old CPAP machine.  Was getting supplies from [facility] before.  His wife says he snores more since he stopped using CPAP.  He hasn't seen a dentist since before the pandemic.  Physical Exam:   Appearance - well kempt   ENMT - no sinus tenderness, no oral exudate, no LAN, Mallampati 3 airway, no stridor  Respiratory - equal breath sounds bilaterally, no wheezing or rales  CV - s1s2 regular rate and rhythm, no murmurs  Ext - no clubbing, no edema  Skin - no rashes  Psych - normal mood and affect   Sleep Tests:  PSG 06/05/22 >> AHI 9.2, SpO2 low 84%, REM effect, no S   Social History:  He  reports that he quit smoking about 17 years ago.  His smoking use included cigarettes and cigars. He started smoking about 22 years ago. He has a 1.00 pack-year smoking history. He quit smokeless tobacco use about 25 years ago.  His smokeless tobacco use included chew. He reports current alcohol use of about 15.0 standard drinks of alcohol per week. He reports that he does not use drugs.  Family History:  His family history includes Colon cancer in his paternal uncle; Coronary artery disease in an other family member; Heart disease in his father and mother; Heart failure in an other family member; Kidney disease in an other family member.     Assessment/Plan:   Obstructive sleep apnea. - sleep study results reviewed - discussed how sleep apnea can impact his health - treatment options discussed - he is not a candidate for an Inspire device because he has mild sleep apnea (would need moderate to severe sleep apnea) - he is willing to try CPAP again - will arrange for new DME to get replacement supplies for his current CPAP device and see if this can be set to auto CPAP 5 to 15 cm H2O  Time Spent Involved in Patient Care on Day of Examination:  26 minutes  Follow up:   Patient Instructions  Will arrange for new medical supply company to get new CPAP mask and equipment.  Follow up in 3 to 4 months.  Medication List:   Allergies as of 07/12/2022  Reactions   Oxycodone Hives, Other (See Comments)   fever   Sulfonamide Derivatives Hives, Shortness Of Breath, Rash        Medication List        Accurate as of July 12, 2022 10:01 AM. If you have any questions, ask your nurse or doctor.          STOP taking these medications    metFORMIN 500 MG 24 hr tablet Commonly known as: GLUCOPHAGE-XR Stopped by: Chesley Mires, MD   montelukast 10 MG tablet Commonly known as: SINGULAIR Stopped by: Chesley Mires, MD       TAKE these medications    aspirin 81 MG tablet Take 81 mg by mouth every evening.   Calcium  Carbonate-Vitamin D 500-125 MG-UNIT Tabs Take by mouth.   cholecalciferol 1000 units tablet Commonly known as: VITAMIN D Take 1,000 Units by mouth every evening.   Fish Oil 1200 MG Caps Take 1,200 mg by mouth every evening.   multivitamin tablet Take 1 tablet by mouth every evening.   rosuvastatin 40 MG tablet Commonly known as: CRESTOR TAKE 1 TABLET DAILY        Signature:  Chesley Mires, MD [place] Pager - [phone] 07/12/2022, 10:01 AM

## 2022-07-12 NOTE — Patient Instructions (Addendum)
Will arrange for new medical supply company to get new CPAP mask and equipment.  Follow up in 3 to 4 months.

## 2022-07-18 ENCOUNTER — Ambulatory Visit
Admission: EM | Admit: 2022-07-18 | Discharge: 2022-07-18 | Disposition: A | Discharge status: Home / Self Care | Payer: No Typology Code available for payment source | Attending: Emergency Medicine | Admitting: Emergency Medicine

## 2022-07-18 ENCOUNTER — Encounter: Payer: Self-pay | Admitting: Emergency Medicine

## 2022-07-18 DIAGNOSIS — R051 Acute cough: Secondary | ICD-10-CM

## 2022-07-18 MED ORDER — GUAIFENESIN ER 600 MG PO TB12: 600.0000 mg | ORAL_TABLET | Freq: Two times a day (BID) | ORAL | 0 refills | Status: AC

## 2022-07-18 MED ORDER — BENZONATATE 100 MG PO CAPS: 100.0000 mg | ORAL_CAPSULE | Freq: Three times a day (TID) | ORAL | 0 refills | Status: DC | PRN

## 2022-07-18 NOTE — ED Triage Notes (Signed)
Pt states he has been having cough and congestion for the last week. States he had negative covid test last Monday but continues coughing. He is taking OTC cough/allergy meds.

## 2022-07-18 NOTE — ED Provider Notes (Signed)
[name] C[facility]    CSN: [id number] Arrival date & time: 07/18/22  1458      History   Chief Complaint Chief Complaint  Patient presents with   Cough    HPI [name] is a 57 y.o. male.  5 day history of cough Productive mostly. Denies sinus congestion, runny nose, fever No shortness of breath or wheezing No known sick contacts Took zyrtec and sudafed Home covid test was negative  Past Medical History:  Diagnosis Date   Allergy    Anemia    CAD (coronary artery disease) of artery bypass graft 2009   Status post bypass grafting 2009 , status post stress echo 2012 no ischemia.   Controlled type 2 diabetes mellitus without complication, without long-term current use of insulin ([place]) 04/18/2021   Depression    past hx after heart surgery    Dermatitis    Left upper extremity   Diverticulitis 07/11/2017   Status post percutaneous drainage catheter placement for treatment   Diverticulosis    Severe in sigmoid colon, Status post percutaneous drainage catheter placement for treatment   Erectile dysfunction    History of diverticular abscess 07/11/2017   Status post percutaneous drainage catheter placement for treatment, 07/30/17 drain removed   History of kidney infection 1990   Hx of adenomatous colonic polyps 09/02/2014   Hyperlipidemia, mixed    Hypotension, unspecified    Myocardial infarction ([place])    per pt had 2 MI- 2009 both    Numbness and tingling in left hand    S/p graft for CABG   Sleep apnea    On 8 cm of water CPAP    Patient Active Problem List   Diagnosis Date Noted   Nausea 06/29/2021   Controlled type 2 diabetes mellitus without complication, without long-term current use of insulin ([place]) 04/18/2021   Hyperglycemia 04/16/2021   Corneal abrasion 09/23/2019   Contact dermatitis 07/21/2019   Preop examination 06/26/2019   Rash of both feet 04/27/2019   Right tennis elbow 01/27/2019   Obesity (BMI 30-39.9) 12/15/2018    Leukocytosis 05/23/2018   Seborrheic keratosis 05/13/2018   S/P laparoscopic-assisted sigmoidectomy 11/27/2017   Diverticulitis of large intestine with abscess without bleeding 07/11/2017   Chronic cough 07/07/2015   Prostate nodule 05/19/2015   Hx of adenomatous colonic polyps 09/02/2014   Annual physical exam 05/20/2014   Allergic rhinitis 11/17/2013   Erectile dysfunction    Sleep apnea    CAD (coronary artery disease) of artery bypass graft    Essential hypertension, benign 07/05/2009   Hyperlipidemia 01/18/2009    Past Surgical History:  Procedure Laterality Date   BICEPT TENODESIS Left 07/09/2019   Procedure: BICEPS TENODESIS;  Surgeon: Hiram Gash, MD;  Location: [facility];  Service: Orthopedics;  Laterality: Left;   COLONOSCOPY  2016   COLONOSCOPY  2007   COLONOSCOPY W/ POLYPECTOMY  10/07/2017   CORONARY ARTERY BYPASS GRAFT  2009   x4   CYSTOSCOPY W/ RETROGRADES Bilateral 11/27/2017   Procedure: BILATERAL CYSTOSCOPY WITH RETROGRADE PYELOGRAM, BILATERAL URETERAL CATHETERIZATION;  Surgeon: Lucas Mallow, MD;  Location: [facility];  Service: Urology;  Laterality: Bilateral;   FLEXIBLE SIGMOIDOSCOPY N/A 11/27/2017   Procedure: FLEXIBLE SIGMOIDOSCOPY;  Surgeon: Ileana Roup, MD;  Location: [facility];  Service: General;  Laterality: N/A;   IR RADIOLOGIST EVAL & MGMT  07/30/2017   LAPAROSCOPIC SIGMOID COLECTOMY N/A 11/27/2017   Procedure: LAPAROSCOPIC SIGMOIDECTOMY;  Surgeon: Ileana Roup, MD;  Location: [facility];  Service: General;  Laterality: N/A;   SHOULDER ARTHROSCOPY Left 11/19/2019   Procedure: ARTHROSCOPY LEFT SHOULDER DEBRIDEMENT WITH LYSIS RESECT ADHESION WITH MANIPULATION;  Surgeon: Hiram Gash, MD;  Location: [facility];  Service: Orthopedics;  Laterality: Left;       Home Medications    Prior to Admission medications   Medication Sig Start Date End Date Taking? Authorizing Provider  benzonatate (TESSALON) 100 MG capsule  Take 1 capsule (100 mg total) by mouth 3 (three) times daily as needed for cough. 07/18/22  Yes Hansford Hirt, Wells Guiles, PA-C  guaiFENesin (MUCINEX) 600 MG 12 hr tablet Take 1 tablet (600 mg total) by mouth 2 (two) times daily for 5 days. 07/18/22 07/23/22 Yes Kierre Deines, Wells Guiles, PA-C  aspirin 81 MG tablet Take 81 mg by mouth every evening.    [provider]  Calcium Carbonate-Vitamin D 500-125 MG-UNIT TABS Take by mouth.    [provider]  cholecalciferol (VITAMIN D) 1000 units tablet Take 1,000 Units by mouth every evening.    [provider]  Multiple Vitamin (MULTIVITAMIN) tablet Take 1 tablet by mouth every evening.     [provider]  Omega-3 Fatty Acids (FISH OIL) 1200 MG CAPS Take 1,200 mg by mouth every evening.    [provider]  rosuvastatin (CRESTOR) 40 MG tablet TAKE 1 TABLET DAILY 06/18/22   Arnoldo Lenis, MD    Family History Family History  Problem Relation Age of Onset   Heart disease Mother    Heart disease Father    Colon cancer Paternal Uncle    Kidney disease Other    Coronary artery disease Other    Heart failure Other    Esophageal cancer Neg Hx    Prostate cancer Neg Hx    Rectal cancer Neg Hx    Stomach cancer Neg Hx    Colon polyps Neg Hx     Social History Social History   Tobacco Use   Smoking status: Former    Packs/day: 0.20    Years: 5.00    Total pack years: 1.00    Types: Cigarettes, Cigars    Start date: 05/28/2000    Quit date: 05/07/2005    Years since quitting: 17.2   Smokeless tobacco: Former    Types: Chew    Quit date: 05/07/1997   Tobacco comments:    chewed tobacco x 20 yrs - quit 2000  Vaping Use   Vaping Use: Never used  Substance Use Topics   Alcohol use: Yes    Alcohol/week: 15.0 standard drinks of alcohol    Types: 10 Shots of liquor, 5 Standard drinks or equivalent per week    Comment: beer & liquor   Drug use: No     Allergies   Oxycodone and Sulfonamide derivatives   Review of  Systems Review of Systems  Respiratory:  Positive for cough.    As per HPI  Physical Exam Triage Vital Signs ED Triage Vitals  Enc Vitals Group     BP 07/18/22 1514 126/78     Pulse Rate 07/18/22 1514 90     Resp 07/18/22 1514 16     Temp 07/18/22 1514 98.3 F (36.8 C)     Temp Source 07/18/22 1514 Oral     SpO2 07/18/22 1514 93 %     Weight --      Height --      Head Circumference --      Peak Flow --  Pain Score 07/18/22 1516 0     Pain Loc --      Pain Edu? --      Excl. in [place]? --    No data found.  Updated Vital Signs BP 126/78 (BP Location: Right Arm)   Pulse 90   Temp 98.3 F (36.8 C) (Oral)   Resp 16   SpO2 93%   Visual Acuity Right Eye Distance:   Left Eye Distance:   Bilateral Distance:    Right Eye Near:   Left Eye Near:    Bilateral Near:     Physical Exam Vitals and nursing note reviewed.  Constitutional:      General: He is not in acute distress. HENT:     Nose: No congestion or rhinorrhea.     Mouth/Throat:     Mouth: Mucous membranes are moist.     Pharynx: Oropharynx is clear. No posterior oropharyngeal erythema.  Eyes:     Conjunctiva/sclera: Conjunctivae normal.  Cardiovascular:     Rate and Rhythm: Normal rate and regular rhythm.     Pulses: Normal pulses.     Heart sounds: Normal heart sounds.  Pulmonary:     Effort: Pulmonary effort is normal. No respiratory distress.     Breath sounds: Normal breath sounds. No wheezing or rales.  Musculoskeletal:     Cervical back: Normal range of motion.  Lymphadenopathy:     Cervical: No cervical adenopathy.  Skin:    General: Skin is warm and dry.  Neurological:     Mental Status: He is alert and oriented to person, place, and time.     UC Treatments / Results  Labs (all labs ordered are listed, but only abnormal results are displayed) Labs Reviewed - No data to display  EKG   Radiology No results found.  Procedures Procedures (including critical care  time)  Medications Ordered in UC Medications - No data to display  Initial Impression / Assessment and Plan / UC Course  I have reviewed the triage vital signs and the nursing notes.  Pertinent labs & imaging results that were available during my care of the patient were reviewed by me and considered in my medical decision making (see chart for details).  Afebrile, well-appearing, clear lungs Discussed viral etiology and symptomatic care Recommend continue Zyrtec.  Sent Tessalon and Mucinex Advised to return if symptoms worsen  Final Clinical Impressions(s) / UC Diagnoses   Final diagnoses:  Acute cough     Discharge Instructions      Continue once daily Zyrtec You can take the Tessalon 3 times daily for cough.  If this makes you drowsy, take only at bedtime. I recommend the Mucinex twice daily to help thin the mucus.  Works best if you are very hydrated   Please return with any concerns     ED Prescriptions     Medication Sig Dispense Auth. Provider   guaiFENesin (MUCINEX) 600 MG 12 hr tablet Take 1 tablet (600 mg total) by mouth 2 (two) times daily for 5 days. 10 tablet Ivey Cina, PA-C   benzonatate (TESSALON) 100 MG capsule Take 1 capsule (100 mg total) by mouth 3 (three) times daily as needed for cough. 21 capsule Kyliana Standen, Wells Guiles, PA-C      PDMP not reviewed this encounter.   Jerrell Mangel, Wells Guiles, [place] 07/18/22 1552

## 2022-07-18 NOTE — Discharge Instructions (Signed)
Continue once daily Zyrtec You can take the Tessalon 3 times daily for cough.  If this makes you drowsy, take only at bedtime. I recommend the Mucinex twice daily to help thin the mucus.  Works best if you are very hydrated   Please return with any concerns

## 2022-09-09 ENCOUNTER — Other Ambulatory Visit: Payer: Self-pay | Admitting: Cardiology

## 2023-01-28 ENCOUNTER — Telehealth (INDEPENDENT_AMBULATORY_CARE_PROVIDER_SITE_OTHER): Payer: No Typology Code available for payment source | Admitting: Physician Assistant

## 2023-01-28 ENCOUNTER — Encounter: Payer: Self-pay | Admitting: Physician Assistant

## 2023-01-28 VITALS — Ht 75.0 in | Wt 250.0 lb

## 2023-01-28 DIAGNOSIS — R051 Acute cough: Secondary | ICD-10-CM

## 2023-01-28 DIAGNOSIS — R0602 Shortness of breath: Secondary | ICD-10-CM

## 2023-01-28 DIAGNOSIS — U071 COVID-19: Secondary | ICD-10-CM | POA: Diagnosis not present

## 2023-01-28 MED ORDER — BENZONATATE 100 MG PO CAPS: 100.0000 mg | ORAL_CAPSULE | Freq: Three times a day (TID) | ORAL | 0 refills | Status: DC | PRN

## 2023-01-28 MED ORDER — NIRMATRELVIR/RITONAVIR (PAXLOVID)TABLET: 3.0000 | ORAL_TABLET | Freq: Two times a day (BID) | ORAL | 0 refills | Status: AC

## 2023-01-28 MED ORDER — ALBUTEROL SULFATE HFA 108 (90 BASE) MCG/ACT IN AERS: 2.0000 | INHALATION_SPRAY | Freq: Four times a day (QID) | RESPIRATORY_TRACT | 0 refills | Status: DC | PRN

## 2023-01-28 NOTE — Progress Notes (Signed)
Pt tested positive for covid yesterday afternoon with an at home test , pt reports body aches , chest pain right side  and has been taking sudafed tylenol and motrin, pt also reports headaches loss of appetite nausea and coughing  thick cuppy mucus

## 2023-01-28 NOTE — Progress Notes (Signed)
..Virtual Visit via Video Note  I connected with [name] on 01/28/23 at 10:10 AM EDT by a video enabled telemedicine application and verified that I am speaking with the correct person using two identifiers.  Location: Patient: home Provider: clinic  .Marland KitchenParticipating in visit:  Patient: [name] Provider: Tandy Gaw PA-C   I discussed the limitations of evaluation and management by telemedicine and the availability of in person appointments. The patient expressed understanding and agreed to proceed.  History of Present Illness: Pt calls into the clinic with positive home covid test. He is on day 3 of symptoms. This is his first time with covid but had all of his covid vaccines per patients except the last booster. He is congested, productive cough, body aches, fever, chills, SOB. He is using OTC remedies without improvement. No prior lung diseases. No leg pain or swelling.   .. Active Ambulatory Problems    Diagnosis Date Noted   Hyperlipidemia 01/18/2009   Essential hypertension, benign 07/05/2009   Erectile dysfunction    Sleep apnea    CAD (coronary artery disease) of artery bypass graft    Allergic rhinitis 11/17/2013   Annual physical exam 05/20/2014   Hx of adenomatous colonic polyps 09/02/2014   Prostate nodule 05/19/2015   Chronic cough 07/07/2015   Diverticulitis of large intestine with abscess without bleeding 07/11/2017   S/P laparoscopic-assisted sigmoidectomy 11/27/2017   Seborrheic keratosis 05/13/2018   Leukocytosis 05/23/2018   Obesity (BMI 30-39.9) 12/15/2018   Right tennis elbow 01/27/2019   Rash of both feet 04/27/2019   Preop examination 06/26/2019   Contact dermatitis 07/21/2019   Corneal abrasion 09/23/2019   Hyperglycemia 04/16/2021   Controlled type 2 diabetes mellitus without complication, without long-term current use of insulin ([facility]) 04/18/2021   Nausea 06/29/2021   Resolved Ambulatory Problems    Diagnosis Date Noted   CAD, ARTERY  BYPASS GRAFT 01/18/2009   Hypotension, unspecified 01/18/2009   Anxiety state, unspecified 07/11/2010   Hyperlipidemia, mixed    Obstructive sleep apnea 11/17/2013   Cerumen impaction 11/17/2013   Carbuncle and furuncle 11/17/2013   Bug bite 12/16/2014   Acute frontal sinusitis 07/22/2015   Acute pain of left shoulder 05/13/2018   Past Medical History:  Diagnosis Date   Allergy    Anemia    Depression    Dermatitis    Diverticulitis 07/11/2017   Diverticulosis    History of diverticular abscess 07/11/2017   History of kidney infection 1990   Myocardial infarction ([facility])    Numbness and tingling in left hand        Observations/Objective: No acute distress Normal mood and appearance Productive cough  .[name] Today's Vitals   01/28/23 0955  Weight: 250 lb (113.4 kg)  Height: 6\' 3"  (1.905 m)   Body mass index is 31.25 kg/m.     Assessment and Plan: Marland KitchenMarland KitchenDiagnoses and all orders for this visit:  COVID-19 virus infection -     nirmatrelvir/ritonavir (PAXLOVID) 20 x 150 MG & 10 x 100MG  TABS; Take 3 tablets by mouth 2 (two) times daily for 5 days. (Take nirmatrelvir 150 mg two tablets twice daily for 5 days and ritonavir 100 mg one tablet twice daily for 5 days) Patient GFR is greater than 60. -     benzonatate (TESSALON) 100 MG capsule; Take 1 capsule (100 mg total) by mouth 3 (three) times daily as needed for cough. -     albuterol (VENTOLIN HFA) 108 (90 Base) MCG/ACT inhaler; Inhale 2 puffs into  the lungs every 6 (six) hours as needed.  Acute cough -     benzonatate (TESSALON) 100 MG capsule; Take 1 capsule (100 mg total) by mouth 3 (three) times daily as needed for cough. -     albuterol (VENTOLIN HFA) 108 (90 Base) MCG/ACT inhaler; Inhale 2 puffs into the lungs every 6 (six) hours as needed.  SOB (shortness of breath) -     albuterol (VENTOLIN HFA) 108 (90 Base) MCG/ACT inhaler; Inhale 2 puffs into the lungs every 6 (six) hours as needed.   Day 3 of Covid  Start  paxlovid, GFR over 60 Do not take crestor with paxlovid Continue symptomatic care Tessalon for cough Albuterol for SOB/Cough as needed every 4-6 hours Quarantine for 5 days then mask for 5 more days Red flag symptoms discussed and when to follow up or go to ED/UC.    Follow Up Instructions:    I discussed the assessment and treatment plan with the patient. The patient was provided an opportunity to ask questions and all were answered. The patient agreed with the plan and demonstrated an understanding of the instructions.   The patient was advised to call back or seek an in-person evaluation if the symptoms worsen or if the condition fails to improve as anticipated.   Tandy Gaw, PA-C

## 2023-02-10 ENCOUNTER — Other Ambulatory Visit: Payer: Self-pay | Admitting: Cardiology

## 2023-02-11 ENCOUNTER — Encounter: Payer: Self-pay | Admitting: Cardiology

## 2023-02-11 ENCOUNTER — Ambulatory Visit: Payer: No Typology Code available for payment source | Attending: Cardiology | Admitting: Cardiology

## 2023-02-11 VITALS — BP 130/82 | HR 80 | Ht 75.0 in | Wt 236.2 lb

## 2023-02-11 DIAGNOSIS — I10 Essential (primary) hypertension: Secondary | ICD-10-CM

## 2023-02-11 DIAGNOSIS — Z79899 Other long term (current) drug therapy: Secondary | ICD-10-CM

## 2023-02-11 DIAGNOSIS — I2510 Atherosclerotic heart disease of native coronary artery without angina pectoris: Secondary | ICD-10-CM

## 2023-02-11 DIAGNOSIS — R739 Hyperglycemia, unspecified: Secondary | ICD-10-CM

## 2023-02-11 DIAGNOSIS — E782 Mixed hyperlipidemia: Secondary | ICD-10-CM | POA: Diagnosis not present

## 2023-02-11 NOTE — Addendum Note (Signed)
Addended by: Lesle Chris on: 02/11/2023 10:18 AM   Modules accepted: Orders

## 2023-02-11 NOTE — Progress Notes (Signed)
Clinical Summary [name] is a 57 y.o.male seen today for follow up of the following medical problems.    1. CAD - CABG in 2009 4 vessel (LIMA-LAD, left radial to OM, SVG-diag, SVG-RCA) - 2012 DSE with baseline LVEF 55-60%, no ischemia - no chest pain. No SOB or DOE.  -meds limited due to low bp's, has not been on beta blocker or ACE-I     - no recent chest pains, no SOB/DOE - compliant with meds   2. Hyperlipidemia  - compliant with statin   Jan 2020 TC 139 HDL 38 TG 107 LDL 81 04/2021 TC 125 TG 177 HDL 33 LDL 67 - we had tried zetia few years, thinks he had side effects and now off     3. OSA - on CPAP, has had trouble establishing for follow up for his OSA - mixed compliance with cpap - overdue for follow up  - referred to [facility] last visit - recent sleep study just mild OSA, not a candidate for inspire since not moderate or severe. Was to retry cpap    4. DM2 - started on metformin      5. 01/2023 COVID +     SH: works for [employer], retired from [employer]. 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 Works as Energy manager.  Past Medical History:  Diagnosis Date   Allergy    Anemia    CAD (coronary artery disease) of artery bypass graft 2009   Status post bypass grafting 2009 , status post stress echo 2012 no ischemia.   Controlled type 2 diabetes mellitus without complication, without long-term current use of insulin ([facility]) 04/18/2021   Depression    past hx after heart surgery    Dermatitis    Left upper extremity   Diverticulitis 07/11/2017   Status post percutaneous drainage catheter placement for treatment   Diverticulosis    Severe in sigmoid colon, Status post percutaneous drainage catheter placement for treatment   Erectile dysfunction    History of diverticular abscess 07/11/2017   Status  post percutaneous drainage catheter placement for treatment, 07/30/17 drain removed   History of kidney infection 1990   Hx of adenomatous colonic polyps 09/02/2014   Hyperlipidemia, mixed    Hypotension, unspecified    Myocardial infarction ([facility])    per pt had 2 MI- 2009 both    Numbness and tingling in left hand    S/p graft for CABG   Sleep apnea    On 8 cm of water CPAP     Allergies  Allergen Reactions   Oxycodone Hives and Other (See Comments)    fever   Sulfonamide Derivatives Hives, Shortness Of Breath and Rash     Current Outpatient Medications  Medication Sig Dispense Refill   albuterol (VENTOLIN HFA) 108 (90 Base) MCG/ACT inhaler Inhale 2 puffs into the lungs every 6 (six) hours as needed. 1 each 0   aspirin 81 MG tablet Take 81 mg by mouth every evening.     benzonatate (TESSALON) 100 MG capsule Take 1 capsule (100 mg total) by mouth 3 (three) times daily as needed for cough. 21 capsule 0   Calcium Carbonate-Vitamin D 500-125 MG-UNIT TABS Take by mouth.     cholecalciferol (VITAMIN D) 1000 units tablet Take 1,000 Units by mouth every evening.     Multiple Vitamin (MULTIVITAMIN)  tablet Take 1 tablet by mouth every evening.      Omega-3 Fatty Acids (FISH OIL) 1200 MG CAPS Take 1,200 mg by mouth every evening.     rosuvastatin (CRESTOR) 40 MG tablet TAKE 1 TABLET DAILY 90 tablet 1   No current facility-administered medications for this visit.     Past Surgical History:  Procedure Laterality Date   BICEPT TENODESIS Left 07/09/2019   Procedure: BICEPS TENODESIS;  Surgeon: Bjorn Pippin, MD;  Location: [facility];  Service: Orthopedics;  Laterality: Left;   COLONOSCOPY  2016   COLONOSCOPY  2007   COLONOSCOPY W/ POLYPECTOMY  10/07/2017   CORONARY ARTERY BYPASS GRAFT  2009   x4   CYSTOSCOPY W/ RETROGRADES Bilateral 11/27/2017   Procedure: BILATERAL CYSTOSCOPY WITH RETROGRADE PYELOGRAM, BILATERAL URETERAL CATHETERIZATION;  Surgeon: Crista Elliot, MD;   Location[facility];  Service: Urology;  Laterality: Bilateral;   FLEXIBLE SIGMOIDOSCOPY N/A 11/27/2017   Procedure: FLEXIBLE SIGMOIDOSCOPY;  Surgeon: Andria Meuse, MD;  Location[facility];  Service: General;  Laterality: N/A;   IR RADIOLOGIST EVAL & MGMT  07/30/2017   LAPAROSCOPIC SIGMOID COLECTOMY N/A 11/27/2017   Procedure: LAPAROSCOPIC SIGMOIDECTOMY;  Surgeon: Andria Meuse, MD;  [facility];  Service: General;  Laterality: N/A;   SHOULDER ARTHROSCOPY Left 11/19/2019   Procedure: ARTHROSCOPY LEFT SHOULDER DEBRIDEMENT WITH LYSIS RESECT ADHESION WITH MANIPULATION;  Surgeon: Bjorn Pippin, MD;  Location: [facility];  Service: Orthopedics;  Laterality: Left;     Allergies  Allergen Reactions   Oxycodone Hives and Other (See Comments)    fever   Sulfonamide Derivatives Hives, Shortness Of Breath and Rash      Family History  Problem Relation Age of Onset   Heart disease Mother    Heart disease Father    Colon cancer Paternal Uncle    Kidney disease Other    Coronary artery disease Other    Heart failure Other    Esophageal cancer Neg Hx    Prostate cancer Neg Hx    Rectal cancer Neg Hx    Stomach cancer Neg Hx    Colon polyps Neg Hx      Social History Mr. Darden reports that he quit smoking about 17 years ago. His smoking use included cigarettes and cigars. He started smoking about 22 years ago. He has a 1 pack-year smoking history. He quit smokeless tobacco use about 25 years ago.  His smokeless tobacco use included chew. Mr. Molesworth reports current alcohol use of about 15.0 standard drinks of alcohol per week.   Review of Systems CONSTITUTIONAL: No weight loss, fever, chills, weakness or fatigue.  HEENT: Eyes: No visual loss, blurred vision, double vision or yellow sclerae.No hearing loss, sneezing, congestion, runny nose or sore throat.  SKIN: No rash or itching.  CARDIOVASCULAR: per hpi RESPIRATORY: No shortness of breath, cough or sputum.   GASTROINTESTINAL: No anorexia, nausea, vomiting or diarrhea. No abdominal pain or blood.  GENITOURINARY: No burning on urination, no polyuria NEUROLOGICAL: No headache, dizziness, syncope, paralysis, ataxia, numbness or tingling in the extremities. No change in bowel or bladder control.  MUSCULOSKELETAL: No muscle, back pain, joint pain or stiffness.  LYMPHATICS: No enlarged nodes. No history of splenectomy.  PSYCHIATRIC: No history of depression or anxiety.  ENDOCRINOLOGIC: No reports of sweating, cold or heat intolerance. No polyuria or polydipsia.  [name]   Physical Examination Today's Vitals   02/11/23 0930  BP: 130/82  Pulse: 80  SpO2:  98%  Weight: 236 lb 3.2 oz (107.1 kg)  Height: 6\' 3"  (1.905 m)   Body mass index is 29.52 kg/m.  Gen: resting comfortably, no acute distress HEENT: no scleral icterus, pupils equal round and reactive, no palptable cervical adenopathy,  CV: RRR, no m/rg, no jvd Resp: Clear to auscultation bilaterally GI: abdomen is soft, non-tender, non-distended, normal bowel sounds, no hepatosplenomegaly MSK: extremities are warm, no edema.  Skin: warm, no rash Neuro:  no focal deficits Psych: appropriate affect   Diagnostic Studies  12/2007  ANGIOGRAPHIC DATA:   1. The left main is free of critical disease.   2. The LAD courses to the apex. The LAD has multiple lesions   including a 70% proximal mid and 80% lesion at the takeoff of the   third diagonal. There were then in the distal vessel multiple   lesions including a 90, 70, 50, 90, and 90 in sequence. There is a   70% apical stenosis as well. The first diagonal Katlyn Muldrew is small-to-   moderate in size with 90% ostial narrowing. Second diagonal is   fairly large in size with 80% proximal and 80% mid narrowing.   Second diagonal does appear to be graftable. The third diagonal   has 90% ostial narrowing.   3. The circumflex has a tiny second marginal with 90% narrowing that   was totally occluded and  it started filling late by collaterals.   4. The right coronary artery is totally occluded proximally and fills   in by left-to-right collaterals.   5. Ventriculography in the RAO projection reveals estimated ejection   fraction of 45-50%. There is inferior wall motion abnormality in   the mid inferior wall corresponding the circumflex territory and   anteroapical abnormality, that is mild.   CONCLUSIONS:   1. Total occlusion of the circumflex and right coronary arteries.   2. Minimum 8 successive lesions in the left anterior descending   artery, and including high-grade disease of the large second   diagonal Lamarr Feenstra as noted above.   3. Progressive symptoms compatible with angina pectoris.   RECOMMENDATIONS: The LAD is not ideal for grafting. Nonetheless, the   apical vessel possibly could be grafted as well as the large diagonal.   In addition, the marginal and the RCA also appear to be optimal for   grafting. Surgical consultation will be obtained.   Assessment and Plan  1. CAD -denies any recent symptoms, continue current meds - EKG today shows NSR, no ischemic changes   2. Hyperlipidemia -will update labs, continue current meds       Antoine Poche, M.D.

## 2023-02-11 NOTE — Patient Instructions (Addendum)
Medication Instructions:   Continue all current medications.   Labwork:  CMET, TSH, FLP, MG, HgA1c, CBC  Office will contact with results via phone, letter or mychart.     Testing/Procedures:  none  Follow-Up:  Your physician wants you to follow up in:  1 year.  You should receive a recall letter in the mail about 2 months prior to the time you are due.  If you don't receive this, please call our office to schedule your follow up appointment.      Any Other Special Instructions Will Be Listed Below (If Applicable).   If you need a refill on your cardiac medications before your next appointment, please call your pharmacy.

## 2023-02-16 LAB — LIPID PANEL
Cholesterol: 135 mg/dL (ref <200)
HDL: 34 mg/dL — ABNORMAL LOW (ref >=40)
LDL Cholesterol (Calc): 78 mg/dL
Non-HDL Cholesterol (Calc): 101 mg/dL (ref <130)
Total CHOL/HDL Ratio: 4 (calc) (ref <5.0)
Triglycerides: 135 mg/dL (ref <150)

## 2023-02-16 LAB — COMPREHENSIVE METABOLIC PANEL
AG Ratio: 1.4 (calc) (ref 1.0–2.5)
ALT: 35 U/L (ref 9–46)
AST: 30 U/L (ref 10–35)
Albumin: 4.2 g/dL (ref 3.6–5.1)
Alkaline phosphatase (APISO): 72 U/L (ref 35–144)
BUN: 14 mg/dL (ref 7–25)
CO2: 22 mmol/L (ref 20–32)
Calcium: 9.5 mg/dL (ref 8.6–10.3)
Chloride: 108 mmol/L (ref 98–110)
Creat: 0.85 mg/dL (ref 0.70–1.30)
Globulin: 3.1 g/dL (ref 1.9–3.7)
Glucose, Bld: 130 mg/dL — ABNORMAL HIGH (ref 65–99)
Potassium: 4.3 mmol/L (ref 3.5–5.3)
Sodium: 138 mmol/L (ref 135–146)
Total Bilirubin: 0.3 mg/dL (ref 0.2–1.2)
Total Protein: 7.3 g/dL (ref 6.1–8.1)

## 2023-02-16 LAB — CBC
HCT: 44.7 % (ref 38.5–50.0)
Hemoglobin: 15.2 g/dL (ref 13.2–17.1)
MCH: 29.5 pg (ref 27.0–33.0)
MCHC: 34 g/dL (ref 32.0–36.0)
MCV: 86.8 fL (ref 80.0–100.0)
MPV: 10.2 fL (ref 7.5–12.5)
Platelets: 313 10*3/uL (ref 140–400)
RBC: 5.15 10*6/uL (ref 4.20–5.80)
RDW: 12.5 % (ref 11.0–15.0)
WBC: 8.9 10*3/uL (ref 3.8–10.8)

## 2023-02-16 LAB — HEMOGLOBIN A1C
Hgb A1c MFr Bld: 6.6 %{Hb} — ABNORMAL HIGH (ref <5.7)
Mean Plasma Glucose: 143 mg/dL
eAG (mmol/L): 7.9 mmol/L

## 2023-02-16 LAB — TSH: TSH: 4.13 m[IU]/L (ref 0.40–4.50)

## 2023-02-16 LAB — MAGNESIUM: Magnesium: 2.3 mg/dL (ref 1.5–2.5)

## 2023-02-25 ENCOUNTER — Encounter: Payer: Self-pay | Admitting: Cardiology

## 2023-03-12 NOTE — Telephone Encounter (Signed)
I would defer to pcp. Typically at 6.6 main goal would be diet and exercise, typically medication would be most indicated above 7 but I would defer to his pcp at there next f/u   Dominga Ferry MD

## 2023-04-26 ENCOUNTER — Encounter: Payer: Self-pay | Admitting: *Deleted

## 2023-05-02 NOTE — Telephone Encounter (Signed)
Please list zetia allergy, can continue current regimen for now. May discuss additional options at f/u.  Dominga Ferry MD

## 2023-05-07 ENCOUNTER — Other Ambulatory Visit: Payer: Self-pay | Admitting: Cardiology

## 2023-06-06 ENCOUNTER — Encounter: Payer: Self-pay | Admitting: Sports Medicine

## 2023-06-06 ENCOUNTER — Ambulatory Visit: Payer: No Typology Code available for payment source | Admitting: Sports Medicine

## 2023-06-06 VITALS — BP 129/73 | HR 81 | Temp 97.7°F

## 2023-06-06 DIAGNOSIS — J069 Acute upper respiratory infection, unspecified: Secondary | ICD-10-CM | POA: Diagnosis not present

## 2023-06-06 LAB — POCT INFLUENZA A/B
Influenza A, POC: NEGATIVE
Influenza B, POC: NEGATIVE

## 2023-06-06 LAB — POC COVID19 BINAXNOW: SARS Coronavirus 2 Ag: NEGATIVE

## 2023-06-06 MED ORDER — PREDNISONE 50 MG PO TABS
50.0000 mg | ORAL_TABLET | Freq: Every day | ORAL | 0 refills | Status: DC
Start: 2023-06-06 — End: 2024-02-28

## 2023-06-06 MED ORDER — AZITHROMYCIN 250 MG PO TABS
ORAL_TABLET | ORAL | 0 refills | Status: DC
Start: 2023-06-06 — End: 2024-02-28

## 2023-06-06 NOTE — Progress Notes (Signed)
    Procedures performed today:    None.  Independent interpretation of notes and tests performed by another provider:   None.  Brief History, Exam, Impression, and Recommendations:    Upper respiratory infection Pleasant 58 year old male with history of CAD and diet-controlled diabetes, he is vaccinated for COVID and flu approximately a week ago. For the past 4 weeks he has had runny nose, cough, productive of foul tasting sputum. No fevers, chills, muscle aches or bodyaches. He overall feels better from his initial symptomatology 4 weeks ago but is wondering more about the duration of his illness. His exam is normal, oropharyngeal exam is normal, he does have some shotty cervical lymphadenopathy particularly right posterior chain. Lungs are clear, speaking full sentences, no nasal flaring or accessory muscle use. COVID and flu test are negative. Considering comorbidities and duration of symptomatology we will proceed with azithromycin and prednisone. Return to see me if not better in a few weeks.  I spent 30 minutes of total time managing this patient today, this includes chart review, face to face, and non-face to face time.  ____________________________________________ Ihor Austin. Benjamin Stain, M.D., ABFM., CAQSM., AME. Primary Care and Sports Medicine [facility]  Adjunct Professor of Family Medicine  [employer] [employer] [facility] [employer]  Restaurant manager, fast food

## 2023-06-06 NOTE — Assessment & Plan Note (Addendum)
Pleasant 58 year old male with history of CAD and diet-controlled diabetes, he is vaccinated for COVID and flu approximately a week ago. For the past 4 weeks he has had runny nose, cough, productive of foul tasting sputum. No fevers, chills, muscle aches or bodyaches. He overall feels better from his initial symptomatology 4 weeks ago but is wondering more about the duration of his illness. His exam is normal, oropharyngeal exam is normal, he does have some shotty cervical lymphadenopathy particularly right posterior chain. Lungs are clear, speaking full sentences, no nasal flaring or accessory muscle use. COVID and flu test are negative. Considering comorbidities and duration of symptomatology we will proceed with azithromycin and prednisone. Return to see me if not better in a few weeks.

## 2023-06-20 ENCOUNTER — Ambulatory Visit (INDEPENDENT_AMBULATORY_CARE_PROVIDER_SITE_OTHER): Payer: No Typology Code available for payment source | Admitting: Sports Medicine

## 2023-06-20 ENCOUNTER — Encounter: Payer: Self-pay | Admitting: Sports Medicine

## 2023-06-20 VITALS — BP 153/77 | HR 80 | Ht 75.0 in | Wt 239.0 lb

## 2023-06-20 DIAGNOSIS — Z0000 Encounter for general adult medical examination without abnormal findings: Secondary | ICD-10-CM | POA: Diagnosis not present

## 2023-06-20 DIAGNOSIS — N402 Nodular prostate without lower urinary tract symptoms: Secondary | ICD-10-CM | POA: Diagnosis not present

## 2023-06-20 DIAGNOSIS — E119 Type 2 diabetes mellitus without complications: Secondary | ICD-10-CM

## 2023-06-20 NOTE — Progress Notes (Signed)
Subjective:    CC: Annual Physical Exam  HPI:  This patient is here for their annual physical  I reviewed the past medical history, family history, social history, surgical history, and allergies today and no changes were needed.  Please see the problem list section below in epic for further details.  Past Medical History: Past Medical History:  Diagnosis Date   Allergy    Anemia    CAD (coronary artery disease) of artery bypass graft 2009   Status post bypass grafting 2009 , status post stress echo 2012 no ischemia.   Controlled type 2 diabetes mellitus without complication, without long-term current use of insulin (HCC) 04/18/2021   Depression    past hx after heart surgery    Dermatitis    Left upper extremity   Diverticulitis 07/11/2017   Status post percutaneous drainage catheter placement for treatment   Diverticulosis    Severe in sigmoid colon, Status post percutaneous drainage catheter placement for treatment   Erectile dysfunction    History of diverticular abscess 07/11/2017   Status post percutaneous drainage catheter placement for treatment, 07/30/17 drain removed   History of kidney infection 1990   Hx of adenomatous colonic polyps 09/02/2014   Hyperlipidemia, mixed    Hypotension, unspecified    Myocardial infarction (HCC)    per pt had 2 MI- 2009 both    Numbness and tingling in left hand    S/p graft for CABG   Sleep apnea    On 8 cm of water CPAP   Past Surgical History: Past Surgical History:  Procedure Laterality Date   BICEPT TENODESIS Left 07/09/2019   Procedure: BICEPS TENODESIS;  Surgeon: Bjorn Pippin, MD;  Location: [facility];  Service: Orthopedics;  Laterality: Left;   COLONOSCOPY  2016   COLONOSCOPY  2007   COLONOSCOPY W/ POLYPECTOMY  10/07/2017   CORONARY ARTERY BYPASS GRAFT  2009   x4   CYSTOSCOPY W/ RETROGRADES Bilateral 11/27/2017   Procedure: BILATERAL CYSTOSCOPY WITH RETROGRADE PYELOGRAM, BILATERAL URETERAL  CATHETERIZATION;  Surgeon: Crista Elliot, MD;  Location: [facility];  Service: Urology;  Laterality: Bilateral;   FLEXIBLE SIGMOIDOSCOPY N/A 11/27/2017   Procedure: FLEXIBLE SIGMOIDOSCOPY;  Surgeon: Andria Meuse, MD;  Location: [facility];  Service: General;  Laterality: N/A;   IR RADIOLOGIST EVAL & MGMT  07/30/2017   LAPAROSCOPIC SIGMOID COLECTOMY N/A 11/27/2017   Procedure: LAPAROSCOPIC SIGMOIDECTOMY;  Surgeon: Andria Meuse, MD;  Location: [facility];  Service: General;  Laterality: N/A;   SHOULDER ARTHROSCOPY Left 11/19/2019   Procedure: ARTHROSCOPY LEFT SHOULDER DEBRIDEMENT WITH LYSIS RESECT ADHESION WITH MANIPULATION;  Surgeon: Bjorn Pippin, MD;  Location: [facility];  Service: Orthopedics;  Laterality: Left;   Social History: Social History   Socioeconomic History   Marital status: Married    Spouse name: Not on file   Number of children: Not on file   Years of education: Not on file   Highest education level: Not on file  Occupational History   Occupation: Full time    Employer: [employer]  Tobacco Use   Smoking status: Former    Current packs/day: 0.00    Average packs/day: 0.2 packs/day for 5.0 years (1.0 ttl pk-yrs)    Types: Cigarettes, Cigars    Start date: 05/28/2000    Quit date: 05/07/2005    Years since quitting: 18.1   Smokeless tobacco: Former    Types: Chew    Quit date: 05/07/1997   Tobacco  comments:    chewed tobacco x 20 yrs - quit 2000  Vaping Use   Vaping status: Never Used  Substance and Sexual Activity   Alcohol use: Yes    Alcohol/week: 15.0 standard drinks of alcohol    Types: 10 Shots of liquor, 5 Standard drinks or equivalent per week    Comment: beer & liquor   Drug use: No   Sexual activity: Yes    Partners: Female  Other Topics Concern   Not on file  Social History Narrative   Not on file   Social Drivers of Health   Financial Resource Strain: Not on file  Food Insecurity: Not on file  Transportation Needs: Not  on file  Physical Activity: Not on file  Stress: Not on file  Social Connections: Not on file   Family History: Family History  Problem Relation Age of Onset   Heart disease Mother    Heart disease Father    Colon cancer Paternal Uncle    Kidney disease Other    Coronary artery disease Other    Heart failure Other    Esophageal cancer Neg Hx    Prostate cancer Neg Hx    Rectal cancer Neg Hx    Stomach cancer Neg Hx    Colon polyps Neg Hx    Allergies: Allergies  Allergen Reactions   Oxycodone Hives and Other (See Comments)    fever   Sulfonamide Derivatives Hives, Shortness Of Breath and Rash   Zetia [Ezetimibe]    Medications: See med rec.  Review of Systems: No headache, visual changes, nausea, vomiting, diarrhea, constipation, dizziness, abdominal pain, skin rash, fevers, chills, night sweats, swollen lymph nodes, weight loss, chest pain, body aches, joint swelling, muscle aches, shortness of breath, mood changes, visual or auditory hallucinations.  Objective:    General: Well Developed, well nourished, and in no acute distress.  Neuro: Alert and oriented x3, extra-ocular muscles intact, sensation grossly intact. Cranial nerves II through XII are intact, motor, sensory, and coordinative functions are all intact. HEENT: Normocephalic, atraumatic, pupils equal round reactive to light, neck supple, no masses, no lymphadenopathy, thyroid nonpalpable. Oropharynx, nasopharynx, external ear canals are unremarkable. Skin: Warm and dry, no rashes noted.  Cardiac: Regular rate and rhythm, no murmurs rubs or gallops.  Respiratory: Clear to auscultation bilaterally. Not using accessory muscles, speaking in full sentences.  Abdominal: Soft, nontender, nondistended, positive bowel sounds, no masses, no organomegaly.  Musculoskeletal: Shoulder, elbow, wrist, hip, knee, ankle stable, and with full range of motion.  Impression and Recommendations:    The patient was counselled, risk  factors were discussed, anticipatory guidance given.  Annual physical exam Very pleasant 58 year old male, fasting annual physical today, up-to-date on screenings, return to see me in a year.  Controlled type 2 diabetes mellitus without complication, without long-term current use of insulin (HCC) We are getting [name] updated on all of his diabetic testing. He will check his feet daily. Ordering A1c, urine microalbumin, eye exam.   ____________________________________________ Ihor Austin. Benjamin Stain, M.D., ABFM., CAQSM., AME. Primary Care and Sports Medicine [facility]  Adjunct Professor of Family Medicine  [place] [employer] [facility] [employer]  Restaurant manager, fast food

## 2023-06-20 NOTE — Assessment & Plan Note (Signed)
Very pleasant 58 year old male, fasting annual physical today, up-to-date on screenings, return to see me in a year.

## 2023-06-20 NOTE — Assessment & Plan Note (Signed)
We are getting [name] updated on all of his diabetic testing. He will check his feet daily. Ordering A1c, urine microalbumin, eye exam.

## 2023-06-21 LAB — COMPREHENSIVE METABOLIC PANEL WITH GFR
ALT: 45 [IU]/L — ABNORMAL HIGH (ref 0–44)
AST: 43 [IU]/L — ABNORMAL HIGH (ref 0–40)
Albumin: 4.4 g/dL (ref 3.8–4.9)
Alkaline Phosphatase: 104 [IU]/L (ref 44–121)
BUN/Creatinine Ratio: 13 (ref 9–20)
BUN: 11 mg/dL (ref 6–24)
Bilirubin Total: 0.4 mg/dL (ref 0.0–1.2)
CO2: 19 mmol/L — ABNORMAL LOW (ref 20–29)
Calcium: 9.7 mg/dL (ref 8.7–10.2)
Chloride: 102 mmol/L (ref 96–106)
Creatinine, Ser: 0.87 mg/dL (ref 0.76–1.27)
Globulin, Total: 2.9 g/dL (ref 1.5–4.5)
Glucose: 142 mg/dL — ABNORMAL HIGH (ref 70–99)
Potassium: 4.5 mmol/L (ref 3.5–5.2)
Sodium: 138 mmol/L (ref 134–144)
Total Protein: 7.3 g/dL (ref 6.0–8.5)
eGFR: 99 mL/min/{1.73_m2}

## 2023-06-21 LAB — LIPID PANEL
Chol/HDL Ratio: 3.9 {ratio} (ref 0.0–5.0)
Cholesterol, Total: 146 mg/dL (ref 100–199)
HDL: 37 mg/dL — ABNORMAL LOW (ref >39)
LDL Chol Calc (NIH): 88 mg/dL (ref 0–99)
Triglycerides: 113 mg/dL (ref 0–149)
VLDL Cholesterol Cal: 21 mg/dL (ref 5–40)

## 2023-06-21 LAB — PSA, TOTAL AND FREE
PSA, Free Pct: 56.7 %
PSA, Free: 0.17 ng/mL
Prostate Specific Ag, Serum: 0.3 ng/mL (ref 0.0–4.0)

## 2023-06-21 LAB — CBC
Hematocrit: 47.2 % (ref 37.5–51.0)
Hemoglobin: 15.8 g/dL (ref 13.0–17.7)
MCH: 28.9 pg (ref 26.6–33.0)
MCHC: 33.5 g/dL (ref 31.5–35.7)
MCV: 86 fL (ref 79–97)
Platelets: 305 10*3/uL (ref 150–450)
RBC: 5.46 x10E6/uL (ref 4.14–5.80)
RDW: 12.3 % (ref 11.6–15.4)
WBC: 10.6 10*3/uL (ref 3.4–10.8)

## 2023-06-21 LAB — MICROALBUMIN / CREATININE URINE RATIO
Creatinine, Urine: 100.7 mg/dL
Microalb/Creat Ratio: <3 mg/g{creat} (ref 0–29)
Microalbumin, Urine: <3 ug/mL

## 2023-06-21 LAB — HEMOGLOBIN A1C
Est. average glucose Bld gHb Est-mCnc: 157 mg/dL
Hgb A1c MFr Bld: 7.1 % — ABNORMAL HIGH (ref 4.8–5.6)

## 2023-06-21 LAB — TSH: TSH: 3.56 u[IU]/mL (ref 0.450–4.500)

## 2023-09-23 ENCOUNTER — Ambulatory Visit: Payer: Self-pay

## 2023-09-23 ENCOUNTER — Telehealth: Admitting: Sports Medicine

## 2023-09-23 DIAGNOSIS — U071 COVID-19: Secondary | ICD-10-CM | POA: Diagnosis not present

## 2023-09-23 MED ORDER — NIRMATRELVIR/RITONAVIR (PAXLOVID)TABLET: ORAL_TABLET | ORAL | 0 refills | Status: DC

## 2023-09-23 MED ORDER — MELOXICAM 15 MG PO TABS: ORAL_TABLET | ORAL | 3 refills | Status: DC

## 2023-09-23 NOTE — Progress Notes (Signed)
   Virtual Visit via WebEx/MyChart   I connected with  [name]  on 09/23/23 via WebEx/MyChart/Doximity Video and verified that I am speaking with the correct person using two identifiers.   I discussed the limitations, risks, security and privacy concerns of performing an evaluation and management service by WebEx/MyChart/Doximity Video, including the higher likelihood of inaccurate diagnosis and treatment, and the availability of in person appointments.  We also discussed the likely need of an additional face to face encounter for complete and high quality delivery of care.  I also discussed with the patient that there may be a patient responsible charge related to this service. The patient expressed understanding and wishes to proceed.  Provider location is in medical facility. Patient location is at their home, different from provider location. People involved in care of the patient during this telehealth encounter were myself, my nurse/medical assistant, and my front office/scheduling team member.  Review of Systems: No fevers, chills, night sweats, weight loss, chest pain, or shortness of breath.   Objective Findings:    General: Speaking full sentences, no audible heavy breathing.  Sounds alert and appropriately interactive.  Appears well.  Face symmetric.  Extraocular movements intact.  Pupils equal and round.  No nasal flaring or accessory muscle use visualized.  Independent interpretation of tests performed by another provider:   None.  Brief History, Exam, Impression, and Recommendations:    COVID-19 Very pleasant 58 year old male, history of type 2 diabetes and coronary artery disease, he has had 3 days of muscle aches, body aches, fevers, cough, runny nose, headache. He did have an exposure to a friend that tested positive for COVID, he tested positive a couple of days ago. On the video visit he speaking full sentences, alert, interactive, no nasal flaring or  accessory muscle use. Due to his high risk status with diabetes and CAD we will add Paxlovid , he can do DayQuil and NyQuil for symptom relief, meloxicam  daily for symptom relief, he does need to stay hydrated. Return to see me as needed. Anticipatory guidance given.   I discussed the above assessment and treatment plan with the patient. The patient was provided an opportunity to ask questions and all were answered. The patient agreed with the plan and demonstrated an understanding of the instructions.   The patient was advised to call back or seek an in-person evaluation if the symptoms worsen or if the condition fails to improve as anticipated.   I provided 30 minutes of face to face and non-face-to-face time during this encounter date, time was needed to gather information, review chart, records, communicate/coordinate with staff remotely, as well as complete documentation.   ____________________________________________ Joselyn Nicely. Sandy Crumb, M.D., ABFM., CAQSM., AME. Primary Care and Sports Medicine [facility]  Adjunct Professor of [facility] Medicine  [employer]  Restaurant manager, fast food

## 2023-09-23 NOTE — Assessment & Plan Note (Signed)
 Very pleasant 58 year old male, history of type 2 diabetes and coronary artery disease, he has had 3 days of muscle aches, body aches, fevers, cough, runny nose, headache. He did have an exposure to a friend that tested positive for COVID, he tested positive a couple of days ago. On the video visit he speaking full sentences, alert, interactive, no nasal flaring or accessory muscle use. Due to his high risk status with diabetes and CAD we will add Paxlovid , he can do DayQuil and NyQuil for symptom relief, meloxicam  daily for symptom relief, he does need to stay hydrated. Return to see me as needed. Anticipatory guidance given.

## 2023-09-23 NOTE — Telephone Encounter (Signed)
 Copied from CRM [id number]. Topic: Clinical - Red Word Triage >> Sep 23, 2023 10:15 AM Danelle Dunning F wrote: [facility] that prompted transfer to Nurse Triage: positive home covid test this morning; heavy congestion; sore throat

## 2024-01-07 ENCOUNTER — Encounter: Payer: Self-pay | Admitting: Sports Medicine

## 2024-02-28 ENCOUNTER — Ambulatory Visit: Attending: Cardiology | Admitting: Cardiology

## 2024-02-28 ENCOUNTER — Encounter: Payer: Self-pay | Admitting: Cardiology

## 2024-02-28 VITALS — BP 132/76 | HR 78 | Ht 75.0 in | Wt 246.4 lb

## 2024-02-28 DIAGNOSIS — I2510 Atherosclerotic heart disease of native coronary artery without angina pectoris: Secondary | ICD-10-CM

## 2024-02-28 DIAGNOSIS — E782 Mixed hyperlipidemia: Secondary | ICD-10-CM

## 2024-02-28 MED ORDER — EZETIMIBE 10 MG PO TABS: 10.0000 mg | ORAL_TABLET | Freq: Every day | ORAL | 6 refills | Status: AC

## 2024-02-28 MED ORDER — ROSUVASTATIN CALCIUM 40 MG PO TABS: 40.0000 mg | ORAL_TABLET | Freq: Every day | ORAL | 3 refills | Status: AC

## 2024-02-28 MED ORDER — ROSUVASTATIN CALCIUM 40 MG PO TABS: 40.0000 mg | ORAL_TABLET | Freq: Every day | ORAL | 3 refills | Status: DC

## 2024-02-28 NOTE — Progress Notes (Signed)
 Clinical Summary Mr. Soderman is a 58 y.o.male seen today for follow up of the following medical problems.    1. CAD - CABG in 2009 4 vessel (LIMA-LAD, left radial to OM, SVG-diag, SVG-RCA) -meds limited due to low bp's, has not been on beta blocker or ACE-I   - no chest pains, no SOB/DOE - compliant with meds   2. Hyperlipidemia  - compliant with statin   Jan 2020 TC 139 HDL 38 TG 107 LDL 81 04/2021 TC 125 TG 177 HDL 33 LDL 67 - he thinks may had had some side effects to zetia  years ago but is unsure, he is willing to retry  06/2023 TC 146 TG 113 HDL 37 LDL 88     3. OSA - using cpap - had seen Dr Shellia most recently 2024.    4. DM2 - started on metformin          SH: works for [employer], retired from [employer]. 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 Works as Energy manager.   Past Medical History:  Diagnosis Date   Allergy    Anemia    CAD (coronary artery disease) of artery bypass graft 2009   Status post bypass grafting 2009 , status post stress echo 2012 no ischemia.   Controlled type 2 diabetes mellitus without complication, without long-term current use of insulin (HCC) 04/18/2021   Depression    past hx after heart surgery    Dermatitis    Left upper extremity   Diverticulitis 07/11/2017   Status post percutaneous drainage catheter placement for treatment   Diverticulosis    Severe in sigmoid colon, Status post percutaneous drainage catheter placement for treatment   Erectile dysfunction    History of diverticular abscess 07/11/2017   Status post percutaneous drainage catheter placement for treatment, 07/30/17 drain removed   History of kidney infection 1990   Hx of adenomatous colonic polyps 09/02/2014   Hyperlipidemia, mixed    Hypotension, unspecified    Myocardial infarction (HCC)    per pt had 2 MI- 2009  both    Numbness and tingling in left hand    S/p graft for CABG   Sleep apnea    On 8 cm of water CPAP     Allergies  Allergen Reactions   Oxycodone Hives and Other (See Comments)    fever   Sulfonamide Derivatives Hives, Shortness Of Breath and Rash   Zetia  [Ezetimibe ]      Current Outpatient Medications  Medication Sig Dispense Refill   aspirin  81 MG tablet Take 81 mg by mouth every evening.     azithromycin  (ZITHROMAX  Z-PAK) 250 MG tablet Take 2 tablets (500 mg) on  Day 1,  followed by 1 tablet (250 mg) once daily on Days 2 through 5. 6 tablet 0   Calcium  Carbonate-Vitamin D  500-125 MG-UNIT TABS Take by mouth.     cholecalciferol (VITAMIN D ) 1000 units tablet Take 1,000 Units by mouth every evening.     meloxicam  (MOBIC ) 15 MG tablet One tab PO every 24 hours with a meal for 2 weeks, then once every 24 hours prn pain. 30 tablet 3   Multiple Vitamin (MULTIVITAMIN) tablet Take 1 tablet by mouth every evening.      nirmatrelvir /ritonavir  (PAXLOVID ) 20 x 150 MG & 10 x 100MG  TABS 1 dose p.o. twice daily for 5 days, may  use any available formulation at pharmacy with the same total dosage. 30 tablet 0   Omega-3 Fatty Acids (FISH OIL) 1200 MG CAPS Take 1,200 mg by mouth every evening.     predniSONE  (DELTASONE ) 50 MG tablet Take 1 tablet (50 mg total) by mouth daily. 5 tablet 0   rosuvastatin  (CRESTOR ) 40 MG tablet TAKE 1 TABLET DAILY 90 tablet 2   No current facility-administered medications for this visit.     Past Surgical History:  Procedure Laterality Date   BICEPT TENODESIS Left 07/09/2019   Procedure: BICEPS TENODESIS;  Surgeon: Cristy Bonner DASEN, MD;  Location: [facility];  Service: Orthopedics;  Laterality: Left;   COLONOSCOPY  2016   COLONOSCOPY  2007   COLONOSCOPY W/ POLYPECTOMY  10/07/2017   CORONARY ARTERY BYPASS GRAFT  2009   x4   CYSTOSCOPY W/ RETROGRADES Bilateral 11/27/2017   Procedure: BILATERAL CYSTOSCOPY WITH RETROGRADE PYELOGRAM, BILATERAL URETERAL  CATHETERIZATION;  Surgeon: Carolee Sherwood JONETTA DOUGLAS, MD;  Location: [facility];  Service: Urology;  Laterality: Bilateral;   FLEXIBLE SIGMOIDOSCOPY N/A 11/27/2017   Procedure: FLEXIBLE SIGMOIDOSCOPY;  Surgeon: Teresa Lonni HERO, MD;  Location: [facility];  Service: General;  Laterality: N/A;   IR RADIOLOGIST EVAL & MGMT  07/30/2017   LAPAROSCOPIC SIGMOID COLECTOMY N/A 11/27/2017   Procedure: LAPAROSCOPIC SIGMOIDECTOMY;  Surgeon: Teresa Lonni HERO, MD;  Location: [facility];  Service: General;  Laterality: N/A;   SHOULDER ARTHROSCOPY Left 11/19/2019   Procedure: ARTHROSCOPY LEFT SHOULDER DEBRIDEMENT WITH LYSIS RESECT ADHESION WITH MANIPULATION;  Surgeon: Cristy Bonner DASEN, MD;  Location: [facility];  Service: Orthopedics;  Laterality: Left;     Allergies  Allergen Reactions   Oxycodone Hives and Other (See Comments)    fever   Sulfonamide Derivatives Hives, Shortness Of Breath and Rash   Zetia  [Ezetimibe ]       Family History  Problem Relation Age of Onset   Heart disease Mother    Heart disease Father    Colon cancer Paternal Uncle    Kidney disease Other    Coronary artery disease Other    Heart failure Other    Esophageal cancer Neg Hx    Prostate cancer Neg Hx    Rectal cancer Neg Hx    Stomach cancer Neg Hx    Colon polyps Neg Hx      Social History Mr. Hernan reports that he quit smoking about 18 years ago. His smoking use included cigarettes and cigars. He started smoking about 23 years ago. He has a 1 pack-year smoking history. He quit smokeless tobacco use about 26 years ago.  His smokeless tobacco use included chew. Mr. Mase reports current alcohol use of about 15.0 standard drinks of alcohol per week.    Physical Examination Today's Vitals   02/28/24 0819  BP: 132/76  Pulse: 78  SpO2: 94%  Weight: 246 lb 6.4 oz (111.8 kg)  Height: 6' 3 (1.905 m)   Body mass index is 30.8 kg/m.  Gen: resting comfortably, no acute distress HEENT: no scleral icterus,  pupils equal round and reactive, no palptable cervical adenopathy,  CV: RRR, no m/rg, no jvd Resp: Clear to auscultation bilaterally GI: abdomen is soft, non-tender, non-distended, normal bowel sounds, no hepatosplenomegaly MSK: extremities are warm, no edema.  Skin: warm, no rash Neuro:  no focal deficits Psych: appropriate affect   Diagnostic Studies  12/2007  ANGIOGRAPHIC DATA:   1. The left main is free of critical disease.   2. The LAD  courses to the apex. The LAD has multiple lesions   including a 70% proximal mid and 80% lesion at the takeoff of the   third diagonal. There were then in the distal vessel multiple   lesions including a 90, 70, 50, 90, and 90 in sequence. There is a   70% apical stenosis as well. The first diagonal Seena Face is small-to-   moderate in size with 90% ostial narrowing. Second diagonal is   fairly large in size with 80% proximal and 80% mid narrowing.   Second diagonal does appear to be graftable. The third diagonal   has 90% ostial narrowing.   3. The circumflex has a tiny second marginal with 90% narrowing that   was totally occluded and it started filling late by collaterals.   4. The right coronary artery is totally occluded proximally and fills   in by left-to-right collaterals.   5. Ventriculography in the RAO projection reveals estimated ejection   fraction of 45-50%. There is inferior wall motion abnormality in   the mid inferior wall corresponding the circumflex territory and   anteroapical abnormality, that is mild.   CONCLUSIONS:   1. Total occlusion of the circumflex and right coronary arteries.   2. Minimum 8 successive lesions in the left anterior descending   artery, and including high-grade disease of the large second   diagonal Krishiv Sandler as noted above.   3. Progressive symptoms compatible with angina pectoris.   RECOMMENDATIONS: The LAD is not ideal for grafting. Nonetheless, the   apical vessel possibly could be grafted as well as  the large diagonal.   In addition, the marginal and the RCA also appear to be optimal for   grafting. Surgical consultation will be obtained.   Assessment and Plan   1. CAD -no symptoms - EKG show NSR, no acute ischemic changes - continue current meds   2. Hyperlipidemia -LDL above goal of <70 - he is unsure if possibly side effects to zetia  in the past, this was prior to our first visit years ago. He is willing to retry. Add zetia  10mg  daily.   F/u 1 year   Dorn PHEBE Ross, M.D.

## 2024-02-28 NOTE — Patient Instructions (Addendum)
 Medication Instructions:   Crestor  refilled today Begin Zetia  10mg  daily Continue all other medications.     Labwork:  none  Testing/Procedures:  none  Follow-Up:  Your physician wants you to follow up in:  1 year.  You should receive a recall letter in the mail about 2 months prior to the time you are due.  If you don't receive this, please call our office to schedule your follow up appointment.      Any Other Special Instructions Will Be Listed Below (If Applicable).   If you need a refill on your cardiac medications before your next appointment, please call your pharmacy.

## 2024-03-19 ENCOUNTER — Telehealth: Payer: Self-pay

## 2024-03-19 NOTE — Telephone Encounter (Signed)
 Copied from CRM #[id number]. Topic: Appointments - Transfer of Care >> Mar 19, 2024  1:28 PM [name] wrote: Pt is requesting to transfer FROM: [name] Pt is requesting to transfer TO: [name] Reason for requested transfer: Provider left practice It is the responsibility of the team the patient would like to transfer to [facility]) to reach out to the patient if for any reason this transfer is not acceptable.

## 2024-03-19 NOTE — Telephone Encounter (Signed)
 Patient is scheduled for a toc with Joy Jessup

## 2024-03-19 NOTE — Telephone Encounter (Signed)
 Would you please assist patient in scheduling a TOC appt  to Joy Jessup? Thank you.

## 2024-03-20 ENCOUNTER — Encounter: Admitting: Medical-Surgical

## 2024-04-06 ENCOUNTER — Ambulatory Visit: Admitting: Family Medicine
